# Patient Record
Sex: Female | Born: 1995 | Race: White | Hispanic: No | Smoking: Never smoker
Health system: Southern US, Community
[De-identification: ages and names within clinical notes are randomized; demographics above are authoritative.]

## PROBLEM LIST (undated history)

## (undated) DIAGNOSIS — F319 Bipolar disorder, unspecified: Secondary | ICD-10-CM

## (undated) DIAGNOSIS — O139 Gestational [pregnancy-induced] hypertension without significant proteinuria, unspecified trimester: Secondary | ICD-10-CM

## (undated) DIAGNOSIS — F909 Attention-deficit hyperactivity disorder, unspecified type: Secondary | ICD-10-CM

## (undated) DIAGNOSIS — F32A Depression, unspecified: Secondary | ICD-10-CM

## (undated) DIAGNOSIS — O1413 Severe pre-eclampsia, third trimester: Secondary | ICD-10-CM

## (undated) DIAGNOSIS — K219 Gastro-esophageal reflux disease without esophagitis: Secondary | ICD-10-CM

## (undated) DIAGNOSIS — F419 Anxiety disorder, unspecified: Secondary | ICD-10-CM

## (undated) DIAGNOSIS — K8020 Calculus of gallbladder without cholecystitis without obstruction: Secondary | ICD-10-CM

## (undated) DIAGNOSIS — F329 Major depressive disorder, single episode, unspecified: Secondary | ICD-10-CM

## (undated) HISTORY — PX: TONSILLECTOMY: SUR1361

## (undated) HISTORY — DX: Bipolar disorder, unspecified: F31.9

## (undated) HISTORY — DX: Depression, unspecified: F32.A

## (undated) HISTORY — DX: Gastro-esophageal reflux disease without esophagitis: K21.9

## (undated) HISTORY — DX: Attention-deficit hyperactivity disorder, unspecified type: F90.9

## (undated) HISTORY — DX: Calculus of gallbladder without cholecystitis without obstruction: K80.20

## (undated) HISTORY — DX: Anxiety disorder, unspecified: F41.9

## (undated) HISTORY — PX: WISDOM TOOTH EXTRACTION: SHX21

---
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
## 2015-01-28 DIAGNOSIS — K8020 Calculus of gallbladder without cholecystitis without obstruction: Secondary | ICD-10-CM | POA: Insufficient documentation

## 2015-01-28 HISTORY — DX: Calculus of gallbladder without cholecystitis without obstruction: K80.20

## 2015-02-06 ENCOUNTER — Emergency Department
Admission: EM | Admit: 2015-02-06 | Discharge: 2015-02-07 | Disposition: A | Discharge status: Home / Self Care | Payer: BLUE CROSS/BLUE SHIELD | Attending: Emergency Medicine | Admitting: Emergency Medicine

## 2015-02-06 ENCOUNTER — Emergency Department: Payer: BLUE CROSS/BLUE SHIELD

## 2015-02-06 ENCOUNTER — Encounter: Payer: Self-pay | Admitting: Emergency Medicine

## 2015-02-06 DIAGNOSIS — K2970 Gastritis, unspecified, without bleeding: Secondary | ICD-10-CM | POA: Insufficient documentation

## 2015-02-06 DIAGNOSIS — R109 Unspecified abdominal pain: Secondary | ICD-10-CM | POA: Diagnosis present

## 2015-02-06 DIAGNOSIS — R1013 Epigastric pain: Secondary | ICD-10-CM

## 2015-02-06 DIAGNOSIS — Z79899 Other long term (current) drug therapy: Secondary | ICD-10-CM | POA: Insufficient documentation

## 2015-02-06 DIAGNOSIS — Z3202 Encounter for pregnancy test, result negative: Secondary | ICD-10-CM | POA: Diagnosis not present

## 2015-02-06 DIAGNOSIS — K8050 Calculus of bile duct without cholangitis or cholecystitis without obstruction: Secondary | ICD-10-CM

## 2015-02-06 LAB — COMPREHENSIVE METABOLIC PANEL
ALBUMIN: 4.9 g/dL (ref 3.5–5.0)
ALK PHOS: 84 U/L (ref 38–126)
ALT: 13 U/L — AB (ref 14–54)
AST: 17 U/L (ref 15–41)
Anion gap: 9 (ref 5–15)
BUN: 8 mg/dL (ref 6–20)
CALCIUM: 9.7 mg/dL (ref 8.9–10.3)
CHLORIDE: 103 mmol/L (ref 101–111)
CO2: 27 mmol/L (ref 22–32)
CREATININE: 0.61 mg/dL (ref 0.44–1.00)
GFR calc Af Amer: >60 mL/min (ref >60)
GFR calc non Af Amer: >60 mL/min (ref >60)
GLUCOSE: 115 mg/dL — AB (ref 65–99)
Potassium: 3.7 mmol/L (ref 3.5–5.1)
SODIUM: 139 mmol/L (ref 135–145)
Total Bilirubin: 0.5 mg/dL (ref 0.3–1.2)
Total Protein: 8.4 g/dL — ABNORMAL HIGH (ref 6.5–8.1)

## 2015-02-06 LAB — CBC
HCT: 39 % (ref 35.0–47.0)
HEMOGLOBIN: 12.9 g/dL (ref 12.0–16.0)
MCH: 26 pg (ref 26.0–34.0)
MCHC: 33.2 g/dL (ref 32.0–36.0)
MCV: 78.3 fL — AB (ref 80.0–100.0)
Platelets: 320 10*3/uL (ref 150–440)
RBC: 4.98 MIL/uL (ref 3.80–5.20)
RDW: 15.5 % — ABNORMAL HIGH (ref 11.5–14.5)
WBC: 9 10*3/uL (ref 3.6–11.0)

## 2015-02-06 LAB — LIPASE, BLOOD: LIPASE: 25 U/L (ref 11–51)

## 2015-02-06 MED ORDER — ONDANSETRON 4 MG PO TBDP
ORAL_TABLET | ORAL | Status: DC
Start: 2015-02-06 — End: 2015-02-07
  Filled 2015-02-06: qty 1

## 2015-02-06 MED ORDER — DICYCLOMINE HCL 20 MG PO TABS: 20.0000 mg | ORAL_TABLET | Freq: Three times a day (TID) | ORAL | Status: DC | PRN

## 2015-02-06 MED ORDER — ONDANSETRON 4 MG PO TBDP
4.0000 mg | ORAL_TABLET | Freq: Once | ORAL | Status: AC | PRN
  Administered 2015-02-06: 4 mg via ORAL

## 2015-02-06 MED ORDER — GI COCKTAIL ~~LOC~~
30.0000 mL | Freq: Once | ORAL | Status: AC
  Administered 2015-02-06: 30 mL via ORAL
  Filled 2015-02-06: qty 30

## 2015-02-06 MED ORDER — PANTOPRAZOLE SODIUM 20 MG PO TBEC: 20.0000 mg | DELAYED_RELEASE_TABLET | Freq: Every day | ORAL | Status: DC

## 2015-02-06 MED ORDER — HYDROMORPHONE HCL 1 MG/ML IJ SOLN
0.5000 mg | Freq: Once | INTRAMUSCULAR | Status: AC
  Administered 2015-02-06: 0.5 mg via INTRAVENOUS
  Filled 2015-02-06: qty 1

## 2015-02-06 MED ORDER — ACETAMINOPHEN 500 MG PO TABS
1000.0000 mg | ORAL_TABLET | Freq: Once | ORAL | Status: AC
  Administered 2015-02-06: 1000 mg via ORAL

## 2015-02-06 MED ORDER — SODIUM CHLORIDE 0.9 % IV BOLUS (SEPSIS)
1000.0000 mL | Freq: Once | INTRAVENOUS | Status: AC
  Administered 2015-02-06: 1000 mL via INTRAVENOUS

## 2015-02-06 MED ORDER — PROMETHAZINE HCL 12.5 MG PO TABS: 12.5000 mg | ORAL_TABLET | Freq: Four times a day (QID) | ORAL | Status: DC | PRN

## 2015-02-06 MED ORDER — ACETAMINOPHEN 500 MG PO TABS
ORAL_TABLET | ORAL | Status: AC
  Filled 2015-02-06: qty 2

## 2015-02-06 MED ORDER — PROMETHAZINE HCL 12.5 MG RE SUPP: 12.5000 mg | Freq: Four times a day (QID) | RECTAL | Status: DC | PRN

## 2015-02-06 MED ORDER — PROMETHAZINE HCL 25 MG/ML IJ SOLN
6.2500 mg | Freq: Once | INTRAMUSCULAR | Status: AC
  Administered 2015-02-06: 6.25 mg via INTRAVENOUS
  Filled 2015-02-06: qty 1

## 2015-02-06 NOTE — ED Notes (Signed)
Pt with emesis in subwait. Pt to go to room 18. Pt states "please help me i want to feel better, hurry up, i can't take this anymore."

## 2015-02-06 NOTE — ED Notes (Signed)
Patient transported to Ultrasound 

## 2015-02-06 NOTE — ED Provider Notes (Signed)
Time Seen: Approximately ----------------------------------------- 11:42 PM on 02/06/2015 -----------------------------------------    I have reviewed the triage notes  Chief Complaint: Abdominal Pain and Emesis   History of Present Illness: Zoe Hunter is a 19 y.o. female who presents with acute onset of epigastric abdominal pain that she states started earlier this afternoon. She describes vomiting multiple times with no blood or bile. She points mainly to an area just superior to the umbilicus is source of her discomfort. She states she's had similar pain before or just not as bad as been diagnosed with gastritis and esophageal reflux disease. She denies any loose stool or diarrhea to this historian and denies any melena or hematochezia. She denies any back or flank pain is not aware of any fever or chills at home. She denies any hematemesis or biliary emesis.   History reviewed. No pertinent past medical history.  There are no active problems to display for this patient.   History reviewed. No pertinent past surgical history.  History reviewed. No pertinent past surgical history.  Current Outpatient Rx  Name  Route  Sig  Dispense  Refill  . amphetamine-dextroamphetamine (ADDERALL) 30 MG tablet   Oral   Take 30 mg by mouth daily.         Marland Kitchen. atomoxetine (STRATTERA) 40 MG capsule   Oral   Take 40 mg by mouth daily.         . cholecalciferol (VITAMIN D) 1000 UNITS tablet   Oral   Take 1,000 Units by mouth daily.         Marland Kitchen. lamoTRIgine (LAMICTAL) 150 MG tablet   Oral   Take 150 mg by mouth.          . ranitidine (ZANTAC) 150 MG tablet   Oral   Take 150 mg by mouth 2 (two) times daily.           Allergies:  Review of patient's allergies indicates no known allergies.  Family History: No family history on file.  Social History: Social History  Substance Use Topics  . Smoking status: Never Smoker   . Smokeless tobacco: Never Used  . Alcohol Use: No      Review of Systems:   10 point review of systems was performed and was otherwise negative:  Constitutional: No fever Eyes: No visual disturbances ENT: No sore throat, ear pain Cardiac: No chest pain Respiratory: No shortness of breath, wheezing, or stridor Abdomen: Epigastric pain, vomited multiple times, No diarrhea Endocrine: No weight loss, No night sweats Extremities: No peripheral edema, cyanosis Skin: No rashes, easy bruising Neurologic: No focal weakness, trouble with speech or swollowing Urologic: No dysuria, Hematuria, or urinary frequency Patient denies any risk of being pregnant  Physical Exam:  ED Triage Vitals  Enc Vitals Group     BP 02/06/15 2037 145/68 mmHg     Pulse Rate 02/06/15 2037 70     Resp 02/06/15 2037 18     Temp 02/06/15 2037 98 F (36.7 C)     Temp Source 02/06/15 2037 Oral     SpO2 02/06/15 2037 100 %     Weight 02/06/15 2037 195 lb (88.451 kg)     Height 02/06/15 2037 5\' 2"  (1.575 m)     Head Cir --      Peak Flow --      Pain Score 02/06/15 2038 10     Pain Loc --      Pain Edu? --      Excl.  in GC? --     General: Awake , Alert , and Oriented times 3; GCS 15 rather dramatic historian Head: Normal cephalic , atraumatic Eyes: Pupils equal , round, reactive to light Nose/Throat: No nasal drainage, patent upper airway without erythema or exudate.  Neck: Supple, Full range of motion, No anterior adenopathy or palpable thyroid masses Lungs: Clear to ascultation without wheezes , rhonchi, or rales Heart: Regular rate, regular rhythm without murmurs , gallops , or rubs Abdomen: Patient's tender primarily toward the epigastric region, no rebound guarding , or rigidity; bowel sounds positive and symmetric in all 4 quadrants. No organomegaly . Negative Murphy's sign, no tenderness over McBurney's point       Extremities: 2 plus symmetric pulses. No edema, clubbing or cyanosis Neurologic: normal ambulation, Motor symmetric without deficits,  sensory intact Skin: warm, dry, no rashes   Labs:   All laboratory work was reviewed including any pertinent negatives or positives listed below:  Labs Reviewed  COMPREHENSIVE METABOLIC PANEL - Abnormal; Notable for the following:    Glucose, Bld 115 (*)    Total Protein 8.4 (*)    ALT 13 (*)    All other components within normal limits  CBC - Abnormal; Notable for the following:    MCV 78.3 (*)    RDW 15.5 (*)    All other components within normal limits  LIPASE, BLOOD  URINALYSIS COMPLETEWITH MICROSCOPIC ([facility] ONLY)  POC URINE PREG, ED      Radiology:  EXAM: US ABDOMEN LIMITED - RIGHT UPPER QUADRANT  COMPARISON: None.  FINDINGS: Gallbladder:  Contracted with multiple gallstones within. No pericholecystic fluid is noted.  Common bile duct:  Diameter: 3.8 mm.  Liver:  No focal lesion identified. Within normal limits in parenchymal echogenicity.  IMPRESSION: Multiple gallstones without complicating factors.     I personally reviewed the radiologic studies     ED Course:  Patient's stay was uneventful and she had symptomatic improvement with IV pain meds and anti-medic therapy. Her differential is primarily around biliary colic versus acute cholecystitis versus other causes for epigastric abdominal pain. Patient was found to have multiple gallstones on her ultrasound but does not clinically have any evidence of acute cholecystitis. Patient be discharged with instructions on biliary colic and advised to return here especially for fever, uncontrolled vomiting, or any other new concerns.    Assessment: Acute epigastric abdominal pain Biliary colic   Final Clinical Impression:  Final diagnoses:  Epigastric pain     Plan: Outpatient management Patient was advised to return immediately if condition worsens. Patient was advised to follow up with their primary care physician or other specialized physicians involved in their outpatient  care             Jennye Moccasin, MD 02/07/15 1700

## 2015-02-06 NOTE — ED Notes (Addendum)
Pt requesting "something to drink and something for pain". Dr. Mayford KnifeWilliams notified. Order for tylenol received. NPO status again explained to pt.

## 2015-02-06 NOTE — ED Notes (Signed)
Pt states has had abdominal pain bilateral upper and vomiting without diarrhea since yesterday. Pt denies fever or chills.

## 2015-02-06 NOTE — ED Notes (Signed)
Pt ambulatory to intake desk, smiling, texting on phone in no acute distress.

## 2015-02-06 NOTE — ED Notes (Signed)
Pt informed of possible wait, pt immediately calling out, "help me, hurry, i can't wait." skin normal color warm and dry. Pt informed will place in subwait in recliner "to rest" while awaiting room. Pt verbalizes understanding.

## 2015-02-07 LAB — POCT PREGNANCY, URINE: Preg Test, Ur: NEGATIVE

## 2015-02-07 LAB — URINALYSIS COMPLETE WITH MICROSCOPIC (ARMC ONLY)
BILIRUBIN URINE: NEGATIVE
GLUCOSE, UA: NEGATIVE mg/dL
HGB URINE DIPSTICK: NEGATIVE
Leukocytes, UA: NEGATIVE
Nitrite: NEGATIVE
Protein, ur: 100 mg/dL — AB
Specific Gravity, Urine: 1.027 (ref 1.005–1.030)
pH: 8 (ref 5.0–8.0)

## 2015-02-07 NOTE — ED Provider Notes (Signed)
----------------------------------------- 11:30 PM on 02/06/2015 -----------------------------------------   Blood pressure 132/93, pulse 67, temperature 98 F (36.7 C), temperature source Oral, resp. rate 18, height  (1.575 m), weight 88.451 kg, last menstrual period 02/04/2015, SpO2 98 %.  Assuming care from Dr. Huel Cote.  In short, Zoe Hunter is a 19 y.o. female with a chief complaint of Abdominal Pain and Emesis .  Refer to the original H&P for additional details.  The current plan of care is to follow-up the ultrasound and urinalysis and urine pregnancy and reassess.  ----------------------------------------- 1:15 AM on 02/07/2015 -----------------------------------------  The patient's ultrasound is notable for multiple gallstones but no evidence of cholecystitis.  Symptoms are well controlled at this time.  Her urinalysis and U pregnant are both negative.  I discussed with the patient and her family the results of the ultrasound and I revised Dr. Renaee Munda discharge instructions to reflect an additional diagnosis of biliary colic.  I gave my usual and customary return precautions.     US Abdomen Limited Ruq  02/06/2015  CLINICAL DATA:  Epigastric pain EXAM: US ABDOMEN LIMITED - RIGHT UPPER QUADRANT COMPARISON:  None. FINDINGS: Gallbladder: Contracted with multiple gallstones within. No pericholecystic fluid is noted. Common bile duct: Diameter: 3.8 mm. Liver: No focal lesion identified. Within normal limits in parenchymal echogenicity. IMPRESSION: Multiple gallstones without complicating factors. Electronically Signed   By: Alcide Clever M.D.   On: 02/06/2015 23:49    Results for orders placed or performed during the hospital encounter of 02/06/15  Lipase, blood  Result Value Ref Range   Lipase 25 11 - 51 U/L  Comprehensive metabolic panel  Result Value Ref Range   Sodium 139 135 - 145 mmol/L   Potassium 3.7 3.5 - 5.1 mmol/L   Chloride 103 101 - 111 mmol/L   CO2 27 22 -  32 mmol/L   Glucose, Bld 115 (H) 65 - 99 mg/dL   BUN 8 6 - 20 mg/dL   Creatinine, Ser 7.82 0.44 - 1.00 mg/dL   Calcium 9.7 8.9 - 95.6 mg/dL   Total Protein 8.4 (H) 6.5 - 8.1 g/dL   Albumin 4.9 3.5 - 5.0 g/dL   AST 17 15 - 41 U/L   ALT 13 (L) 14 - 54 U/L   Alkaline Phosphatase 84 38 - 126 U/L   Total Bilirubin 0.5 0.3 - 1.2 mg/dL   GFR calc non Af Amer >60 >60 mL/min   GFR calc Af Amer >60 >60 mL/min   Anion gap 9 5 - 15  CBC  Result Value Ref Range   WBC 9.0 3.6 - 11.0 K/uL   RBC 4.98 3.80 - 5.20 MIL/uL   Hemoglobin 12.9 12.0 - 16.0 g/dL   HCT 21.3 08.6 - 57.8 %   MCV 78.3 (L) 80.0 - 100.0 fL   MCH 26.0 26.0 - 34.0 pg   MCHC 33.2 32.0 - 36.0 g/dL   RDW 46.9 (H) 62.9 - 52.8 %   Platelets 320 150 - 440 K/uL  Urinalysis complete, with microscopic ([facility] only)  Result Value Ref Range   Color, Urine YELLOW (A) YELLOW   APPearance HAZY (A) CLEAR   Glucose, UA NEGATIVE NEGATIVE mg/dL   Bilirubin Urine NEGATIVE NEGATIVE   Ketones, ur 1+ (A) NEGATIVE mg/dL   Specific Gravity, Urine 1.027 1.005 - 1.030   Hgb urine dipstick NEGATIVE NEGATIVE   pH 8.0 5.0 - 8.0   Protein, ur 100 (A) NEGATIVE mg/dL   Nitrite NEGATIVE NEGATIVE   Leukocytes, UA  NEGATIVE NEGATIVE   RBC / HPF 0-5 0 - 5 RBC/hpf   WBC, UA 0-5 0 - 5 WBC/hpf   Bacteria, UA RARE (A) NONE SEEN   Squamous Epithelial / LPF 0-5 (A) NONE SEEN   Mucous PRESENT    Amorphous Crystal PRESENT   Pregnancy, urine POC  Result Value Ref Range   Preg Test, Ur NEGATIVE NEGATIVE     Loleta Roseory Rehaan Viloria, MD 02/07/15 [phone]

## 2015-02-07 NOTE — Discharge Instructions (Signed)
As we discussed, your symptoms may be caused by gallstones, or just standard gastritis.  Please read through the information below.  Given the gallstones on your ultrasound, we do recommend that you follow-up with a surgeon to discuss scheduling a surgery to have your gallbladder removed (cholecystectomy).  Return to the emergency department with new or worsening symptoms that concern you.  Biliary Colic  Biliary colic is a pain in the upper abdomen. The pain:  Is usually felt on the right side of the abdomen, but it may also be felt in the center of the abdomen, just below the breastbone (sternum).  May spread back toward the right shoulder blade.  May be steady or irregular.  May be accompanied by nausea and vomiting. Most of the time, the pain goes away in 1-5 hours. After the most intense pain passes, the abdomen may continue to ache mildly for about 24 hours.    Biliary colic is caused by a blockage in the bile duct. The bile duct is a pathway that carries bile--a liquid that helps to digest fats--from the gallbladder to the small intestine. Biliary colic usually occurs after eating, when the digestive system demands bile. The pain develops when muscle cells contract forcefully to try to move the blockage so that bile can get by.  HOME CARE INSTRUCTIONS  Take medicines only as directed by your health care provider.  Drink enough fluid to keep your urine clear or pale yellow.  Avoid fatty, greasy, and fried foods. These kinds of foods increase your body's demand for bile.  Avoid any foods that make your pain worse.  Avoid overeating.  Avoid having a large meal after fasting. SEEK MEDICAL CARE IF:  You develop a fever.  Your pain gets worse.  You vomit.  You develop nausea that prevents you from eating and drinking. SEEK IMMEDIATE MEDICAL CARE IF:  You suddenly develop a fever and shaking chills.  You develop a yellowish discoloration (jaundice) of:  Skin.  Whites of the eyes.    Mucous membranes. You have continuous or severe pain that is not relieved with medicines.  You have nausea and vomiting that is not relieved with medicines.  You develop dizziness or you faint. This information is not intended to replace advice given to you by your health care provider. Make sure you discuss any questions you have with your health care provider.  Document Released: 07/17/2005 Document Revised: 06/30/2014 Document Reviewed: 11/25/2013  Elsevier Interactive Patient Education 2016 [employer] Inc[employer]    Gastritis, Adult Gastritis is soreness and swelling (inflammation) of the lining of the stomach. Gastritis can develop as a sudden onset (acute) or long-term (chronic) condition. If gastritis is not treated, it can lead to stomach bleeding and ulcers. CAUSES  Gastritis occurs when the stomach lining is weak or damaged. Digestive juices from the stomach then inflame the weakened stomach lining. The stomach lining may be weak or damaged due to viral or bacterial infections. One common bacterial infection is the Helicobacter pylori infection. Gastritis can also result from excessive alcohol consumption, taking certain medicines, or having too much acid in the stomach.  SYMPTOMS  In some cases, there are no symptoms. When symptoms are present, they may include:  Pain or a burning sensation in the upper abdomen.  Nausea.  Vomiting.  An uncomfortable feeling of fullness after eating. DIAGNOSIS  Your caregiver may suspect you have gastritis based on your symptoms and a physical exam. To determine the cause of your gastritis, your caregiver may  perform the following:  Blood or stool tests to check for the H pylori bacterium.  Gastroscopy. A thin, flexible tube (endoscope) is passed down the esophagus and into the stomach. The endoscope has a light and camera on the end. Your caregiver uses the endoscope to view the inside of the stomach.  Taking a tissue sample (biopsy) from the  stomach to examine under a microscope. TREATMENT  Depending on the cause of your gastritis, medicines may be prescribed. If you have a bacterial infection, such as an H pylori infection, antibiotics may be given. If your gastritis is caused by too much acid in the stomach, H2 blockers or antacids may be given. Your caregiver may recommend that you stop taking aspirin, ibuprofen, or other nonsteroidal anti-inflammatory drugs (NSAIDs). HOME CARE INSTRUCTIONS  Only take over-the-counter or prescription medicines as directed by your caregiver.  If you were given antibiotic medicines, take them as directed. Finish them even if you start to feel better.  Drink enough fluids to keep your urine clear or pale yellow.  Avoid foods and drinks that make your symptoms worse, such as:  Caffeine or alcoholic drinks.  Chocolate.  Peppermint or mint flavorings.  Garlic and onions.  Spicy foods.  Citrus fruits, such as oranges, lemons, or limes.  Tomato-based foods such as sauce, chili, salsa, and pizza.  Fried and fatty foods.  Eat small, frequent meals instead of large meals. SEEK IMMEDIATE MEDICAL CARE IF:   You have black or dark red stools.  You vomit blood or material that looks like coffee grounds.  You are unable to keep fluids down.  Your abdominal pain gets worse.  You have a fever.  You do not feel better after 1 week.  You have any other questions or concerns. MAKE SURE YOU:  Understand these instructions.  Will watch your condition.  Will get help right away if you are not doing well or get worse.   This information is not intended to replace advice given to you by your health care provider. Make sure you discuss any questions you have with your health care provider.   Document Released: 02/07/2001 Document Revised: 08/15/2011 Document Reviewed: 03/29/2011 Elsevier Interactive Patient Education [employer]2016 [employer].  Please return immediately if condition worsens.  Please contact her primary physician or the physician you were given for referral. If you have any specialist physicians involved in her treatment and plan please also contact them. Thank you for using [facility]. Please return especially for fever, uncontrolled vomiting, bloody emesis, or dark tarry stool.

## 2015-02-08 ENCOUNTER — Telehealth: Payer: Self-pay

## 2015-02-08 ENCOUNTER — Encounter: Payer: Self-pay | Admitting: Emergency Medicine

## 2015-02-08 ENCOUNTER — Emergency Department
Admission: EM | Admit: 2015-02-08 | Discharge: 2015-02-08 | Disposition: A | Discharge status: Home / Self Care | Payer: BLUE CROSS/BLUE SHIELD | Attending: Emergency Medicine | Admitting: Emergency Medicine

## 2015-02-08 DIAGNOSIS — K8020 Calculus of gallbladder without cholecystitis without obstruction: Secondary | ICD-10-CM | POA: Insufficient documentation

## 2015-02-08 DIAGNOSIS — Z79899 Other long term (current) drug therapy: Secondary | ICD-10-CM | POA: Insufficient documentation

## 2015-02-08 DIAGNOSIS — K8050 Calculus of bile duct without cholangitis or cholecystitis without obstruction: Secondary | ICD-10-CM

## 2015-02-08 DIAGNOSIS — R109 Unspecified abdominal pain: Secondary | ICD-10-CM | POA: Diagnosis present

## 2015-02-08 DIAGNOSIS — R1011 Right upper quadrant pain: Secondary | ICD-10-CM

## 2015-02-08 LAB — CBC
HCT: 38.1 % (ref 35.0–47.0)
HEMOGLOBIN: 12.3 g/dL (ref 12.0–16.0)
MCH: 25.3 pg — AB (ref 26.0–34.0)
MCHC: 32.2 g/dL (ref 32.0–36.0)
MCV: 78.7 fL — AB (ref 80.0–100.0)
Platelets: 315 10*3/uL (ref 150–440)
RBC: 4.84 MIL/uL (ref 3.80–5.20)
RDW: 15.3 % — ABNORMAL HIGH (ref 11.5–14.5)
WBC: 10.3 10*3/uL (ref 3.6–11.0)

## 2015-02-08 LAB — COMPREHENSIVE METABOLIC PANEL
ALT: 12 U/L — ABNORMAL LOW (ref 14–54)
ANION GAP: 7 (ref 5–15)
AST: 15 U/L (ref 15–41)
Albumin: 4.5 g/dL (ref 3.5–5.0)
Alkaline Phosphatase: 89 U/L (ref 38–126)
BUN: 10 mg/dL (ref 6–20)
CHLORIDE: 103 mmol/L (ref 101–111)
CO2: 29 mmol/L (ref 22–32)
Calcium: 9.2 mg/dL (ref 8.9–10.3)
Creatinine, Ser: 0.76 mg/dL (ref 0.44–1.00)
GFR calc non Af Amer: >60 mL/min (ref >60)
Glucose, Bld: 95 mg/dL (ref 65–99)
Potassium: 3.3 mmol/L — ABNORMAL LOW (ref 3.5–5.1)
SODIUM: 139 mmol/L (ref 135–145)
Total Bilirubin: 0.4 mg/dL (ref 0.3–1.2)
Total Protein: 7.6 g/dL (ref 6.5–8.1)

## 2015-02-08 LAB — LIPASE, BLOOD: LIPASE: 37 U/L (ref 11–51)

## 2015-02-08 MED ORDER — SODIUM CHLORIDE 0.9 % IV BOLUS (SEPSIS)
1000.0000 mL | Freq: Once | INTRAVENOUS | Status: AC
Start: 2015-02-08 — End: 2015-02-08
  Administered 2015-02-08: 1000 mL via INTRAVENOUS

## 2015-02-08 MED ORDER — FENTANYL CITRATE (PF) 100 MCG/2ML IJ SOLN
50.0000 ug | Freq: Once | INTRAMUSCULAR | Status: AC
  Administered 2015-02-08: 50 ug via INTRAVENOUS
  Filled 2015-02-08: qty 2

## 2015-02-08 MED ORDER — MORPHINE SULFATE (PF) 4 MG/ML IV SOLN
4.0000 mg | Freq: Once | INTRAVENOUS | Status: AC
  Administered 2015-02-08: 4 mg via INTRAVENOUS
  Filled 2015-02-08: qty 1

## 2015-02-08 MED ORDER — ONDANSETRON HCL 4 MG/2ML IJ SOLN
4.0000 mg | Freq: Once | INTRAMUSCULAR | Status: AC
  Administered 2015-02-08: 4 mg via INTRAVENOUS
  Filled 2015-02-08: qty 2

## 2015-02-08 MED ORDER — OXYCODONE-ACETAMINOPHEN 5-325 MG PO TABS: 1.0000 | ORAL_TABLET | ORAL | Status: DC | PRN

## 2015-02-08 MED ORDER — ONDANSETRON 4 MG PO TBDP: 4.0000 mg | ORAL_TABLET | Freq: Three times a day (TID) | ORAL | Status: DC | PRN

## 2015-02-08 NOTE — Discharge Instructions (Signed)
1. Take pain and nausea medicines as needed (Percocet/Zofran #20 and (. 2. Bland diet times one week, then slowly advance as tolerated. Avoid fatty, spicy, greasy foods and drinks. 3. Return to the ER for any symptoms, persistent vomiting, difficulty breathing or other concerns.  Abdominal Pain, Adult Many things can cause abdominal pain. Usually, abdominal pain is not caused by a disease and will improve without treatment. It can often be observed and treated at home. Your health care provider will do a physical exam and possibly order blood tests and X-rays to help determine the seriousness of your pain. However, in many cases, more time must pass before a clear cause of the pain can be found. Before that point, your health care provider may not know if you need more testing or further treatment. HOME CARE INSTRUCTIONS Monitor your abdominal pain for any changes. The following actions may help to alleviate any discomfort you are experiencing:  Only take over-the-counter or prescription medicines as directed by your health care provider.  Do not take laxatives unless directed to do so by your health care provider.  Try a clear liquid diet (broth, tea, or water) as directed by your health care provider. Slowly move to a bland diet as tolerated. SEEK MEDICAL CARE IF:  You have unexplained abdominal pain.  You have abdominal pain associated with nausea or diarrhea.  You have pain when you urinate or have a bowel movement.  You experience abdominal pain that wakes you in the night.  You have abdominal pain that is worsened or improved by eating food.  You have abdominal pain that is worsened with eating fatty foods.  You have a fever. SEEK IMMEDIATE MEDICAL CARE IF:  Your pain does not go away within 2 hours.  You keep throwing up (vomiting).  Your pain is felt only in portions of the abdomen, such as the right side or the left lower portion of the abdomen.  You pass bloody or black  tarry stools. MAKE SURE YOU:  Understand these instructions.  Will watch your condition.  Will get help right away if you are not doing well or get worse.   This information is not intended to replace advice given to you by your health care provider. Make sure you discuss any questions you have with your health care provider.   Document Released: 11/23/2004 Document Revised: 11/04/2014 Document Reviewed: 10/23/2012 Elsevier Interactive Patient Education 2016 Elsevier Inc.  Biliary Colic Biliary colic is a pain in the upper abdomen. The pain:  Is usually felt on the right side of the abdomen, but it may also be felt in the center of the abdomen, just below the breastbone (sternum).  May spread back toward the right shoulder blade.  May be steady or irregular.  May be accompanied by nausea and vomiting. Most of the time, the pain goes away in 1-5 hours. After the most intense pain passes, the abdomen may continue to ache mildly for about 24 hours. Biliary colic is caused by a blockage in the bile duct. The bile duct is a pathway that carries bile--a liquid that helps to digest fats--from the gallbladder to the small intestine. Biliary colic usually occurs after eating, when the digestive system demands bile. The pain develops when muscle cells contract forcefully to try to move the blockage so that bile can get by. HOME CARE INSTRUCTIONS  Take medicines only as directed by your health care provider.  Drink enough fluid to keep your urine clear or pale yellow.  Avoid fatty, greasy, and fried foods. These kinds of foods increase your body's demand for bile.  Avoid any foods that make your pain worse.  Avoid overeating.  Avoid having a large meal after fasting. SEEK MEDICAL CARE IF:  You develop a fever.  Your pain gets worse.  You vomit.  You develop nausea that prevents you from eating and drinking. SEEK IMMEDIATE MEDICAL CARE IF:  You suddenly develop a fever and  shaking chills.  You develop a yellowish discoloration (jaundice) of:  Skin.  Whites of the eyes.  Mucous membranes.  You have continuous or severe pain that is not relieved with medicines.  You have nausea and vomiting that is not relieved with medicines.  You develop dizziness or you faint.   This information is not intended to replace advice given to you by your health care provider. Make sure you discuss any questions you have with your health care provider.   Document Released: 07/17/2005 Document Revised: 06/30/2014 Document Reviewed: 11/25/2013 Elsevier Interactive Patient Education [employer]2016 [employer].

## 2015-02-08 NOTE — ED Provider Notes (Signed)
[facility] Emergency Department Provider Note  ____________________________________________  Time seen: Approximately 1:35 AM  I have reviewed the triage vital signs and the nursing notes.   HISTORY  Chief Complaint Abdominal Pain    HPI Zoe Hunter is a 19 y.o. female who presents to the ED from home with a chief complaint of abdominal pain. Patient was seen in the ED last night and diagnosed with multiple gallstones on ultrasound. She began to experience severe pain after eating [employer] this evening associated with nausea and vomiting. Denies associated fever, chills, chest pain, shortness of breath, diarrhea. Nothing makes her pain better. Eating made her pain worse.   Past medical history ADHD  There are no active problems to display for this patient.   History reviewed. No pertinent past surgical history.  Current Outpatient Rx  Name  Route  Sig  Dispense  Refill  . amphetamine-dextroamphetamine (ADDERALL) 30 MG tablet   Oral   Take 30 mg by mouth daily.         Marland Kitchen. atomoxetine (STRATTERA) 40 MG capsule   Oral   Take 40 mg by mouth daily.         . cholecalciferol (VITAMIN D) 1000 UNITS tablet   Oral   Take 1,000 Units by mouth daily.         Marland Kitchen. dicyclomine (BENTYL) 20 MG tablet   Oral   Take 1 tablet (20 mg total) by mouth 3 (three) times daily as needed for spasms.   30 tablet   0   . lamoTRIgine (LAMICTAL) 150 MG tablet   Oral   Take 150 mg by mouth.          . pantoprazole (PROTONIX) 20 MG tablet   Oral   Take 1 tablet (20 mg total) by mouth daily.   30 tablet   1   . promethazine (PHENERGAN) 12.5 MG suppository   Rectal   Place 1 suppository (12.5 mg total) rectally every 6 (six) hours as needed for nausea or vomiting.   12 each   0   . promethazine (PHENERGAN) 12.5 MG tablet   Oral   Take 1 tablet (12.5 mg total) by mouth every 6 (six) hours as needed for nausea or vomiting.   30 tablet   0   . ranitidine  (ZANTAC) 150 MG tablet   Oral   Take 150 mg by mouth 2 (two) times daily.           Allergies Review of patient's allergies indicates no known allergies.  No family history on file.  Social History Social History  Substance Use Topics  . Smoking status: Never Smoker   . Smokeless tobacco: Never Used  . Alcohol Use: No    Review of Systems Constitutional: No fever/chills Eyes: No visual changes. ENT: No sore throat. Cardiovascular: Denies chest pain. Respiratory: Denies shortness of breath. Gastrointestinal: Positive for abdominal pain.  Positive for nausea and vomiting.  No diarrhea.  No constipation. Genitourinary: Negative for dysuria. Musculoskeletal: Negative for back pain. Skin: Negative for rash. Neurological: Negative for headaches, focal weakness or numbness.  10-point ROS otherwise negative.  ____________________________________________   PHYSICAL EXAM:  VITAL SIGNS: ED Triage Vitals  Enc Vitals Group     BP 02/08/15 0034 178/145 mmHg     Pulse Rate 02/08/15 0032 85     Resp 02/08/15 0032 22     Temp 02/08/15 0032 98.9 F (37.2 C)     Temp Source 02/08/15 0032 Oral  SpO2 02/08/15 0032 100 %     Weight 02/08/15 0032 195 lb (88.451 kg)     Height 02/08/15 0032  (1.575 m)     Head Cir --      Peak Flow --      Pain Score 02/08/15 0034 10     Pain Loc --      Pain Edu? --      Excl. in GC? --     Constitutional: Alert and oriented. Well appearing and in moderate acute distress. Moaning. Eyes: Conjunctivae are normal. PERRL. EOMI. Head: Atraumatic. Nose: No congestion/rhinnorhea. Mouth/Throat: Mucous membranes are moist.  Oropharynx non-erythematous. Neck: No stridor.   Cardiovascular: Normal rate, regular rhythm. Grossly normal heart sounds.  Good peripheral circulation. Respiratory: Normal respiratory effort.  No retractions. Lungs CTAB. Gastrointestinal: Soft and moderately tender to palpation right upper quadrant without rebound or  guarding. No distention. No abdominal bruits. No CVA tenderness. Musculoskeletal: No lower extremity tenderness nor edema.  No joint effusions. Neurologic:  Normal speech and language. No gross focal neurologic deficits are appreciated. No gait instability. Skin:  Skin is warm, dry and intact. No rash noted. Psychiatric: Mood and affect are normal. Speech and behavior are normal.  ____________________________________________   LABS (all labs ordered are listed, but only abnormal results are displayed)  Labs Reviewed  COMPREHENSIVE METABOLIC PANEL - Abnormal; Notable for the following:    Potassium 3.3 (*)    ALT 12 (*)    All other components within normal limits  CBC - Abnormal; Notable for the following:    MCV 78.7 (*)    MCH 25.3 (*)    RDW 15.3 (*)    All other components within normal limits  LIPASE, BLOOD  URINALYSIS COMPLETEWITH MICROSCOPIC ([facility] ONLY)   ____________________________________________  EKG  None ____________________________________________  RADIOLOGY  None ____________________________________________   PROCEDURES  Procedure(s) performed: None  Critical Care performed: No  ____________________________________________   INITIAL IMPRESSION / ASSESSMENT AND PLAN / ED COURSE  Pertinent labs & imaging results that were available during my care of the patient were reviewed by me and considered in my medical decision making (see chart for details).  19 year old female who returns to the ED with right upper quadrant abdominal pain and vomiting. She was found to have multiple gallstones on ultrasound last night. Uncomfortable appearing in moderate distress. Will recheck lab work and administer IV analgesia.  ----------------------------------------- 1:46 AM on 02/08/2015 -----------------------------------------  IV morphine administered for persistent pain. Discussed with Dr. Orvis Brill (surgery on call) who feels there is no indication for emergent  surgery or hospital admission. Will try pain control and call back if patient's pain is not adequately controlled.  ----------------------------------------- 3:15 AM on 02/08/2015 -----------------------------------------  Patient is sleeping. No further emesis. Lengthy discussion with her grandparents; plan for discharge home with analgesia, antiemetic and close outpatient follow-up. Strict return precautions given. All verbalize understanding and agree with plan of care. ____________________________________________   FINAL CLINICAL IMPRESSION(S) / ED DIAGNOSES  Final diagnoses:  Biliary colic  Right upper quadrant pain      Irean Hong, MD 02/08/15 0602

## 2015-02-08 NOTE — ED Notes (Signed)
Pt to triage via w/c, moaning; st here last night and dx with gallstones; st return of abd pain "all over" accomp by N/V

## 2015-02-08 NOTE — Telephone Encounter (Signed)
Patient calls back in at this time. Is having severe pain and episodes of vomiting but does not want to go to ER once again. Reviewed notes from ER. Does not indicate immediate need for surgery but since patient is having significant pain, will bring patient in tomorrow to urgent clinic to be seen by Dr. Tonita CongWoodham. Patient denies fever, constipation, or diarrhea. "Episodes" of pain and vomiting last 2-5 hours.  Explained to patient that if pain or vomiting becomes intolerable over night, she will need to be seen in ER once again as we [facility].  She verbalizes understanding of conversation.  [facility].

## 2015-02-09 ENCOUNTER — Encounter: Payer: Self-pay | Admitting: General Surgery

## 2015-02-09 ENCOUNTER — Ambulatory Visit (INDEPENDENT_AMBULATORY_CARE_PROVIDER_SITE_OTHER): Payer: BLUE CROSS/BLUE SHIELD | Admitting: General Surgery

## 2015-02-09 ENCOUNTER — Telehealth: Payer: Self-pay

## 2015-02-09 VITALS — BP 103/69 | HR 79 | Temp 98.2°F | Ht 62.5 in | Wt 199.0 lb

## 2015-02-09 DIAGNOSIS — K8020 Calculus of gallbladder without cholecystitis without obstruction: Secondary | ICD-10-CM | POA: Diagnosis not present

## 2015-02-09 NOTE — Patient Instructions (Addendum)
Don't be afraid to eat. However, you will need to eat healthier; non-fat foods.  If you decide on going forward on doing surgery, I recommend doing it Laparoscopic. This surgery will require four small incisions.  If you have any surgical questions, please give us a call.

## 2015-02-09 NOTE — Progress Notes (Signed)
Patient ID: Zoe Hunter, female   DOB: 05/05/1995, 19 y.o.   MRN: [id number]  CC: ABDOMINAL PAIN  HPI Zoe Chiquitomily K Basich is a 19 y.o. female presents to clinic for evaluation of right upper quadrant abdominal pain for which she's been seen in the emergency department twice. She was in the emergency department on Saturday and Sunday of the previous weekend with acute onset right upper quadrant pain that started between 30 minutes an hour after eating. The pain did not subside until she was treated in the emergency artery. Since that time the pain has been primarily gone however she has had a fear of eating. She denies any fevers, chills, chest pain, shortness breath, diarrhea, constipation. She did have nausea and vomiting with the worse of the pain over the weekend. Prior to this weekend she states she's had some mild right upper quadrant bowel pain but nothing like this before. She lives with her grandparents will be leaving town on Christmas Day.  HPI  Past Medical History  Diagnosis Date  . Bipolar disorder (HCC)   . GERD (gastroesophageal reflux disease)   . Cholelithiasis 01/2015  . ADHD (attention deficit hyperactivity disorder)     Past Surgical History  Procedure Laterality Date  . Tonsillectomy      Family History  Problem Relation Age of Onset  . Hypertension Mother   . Hypertension Maternal Grandmother   . Cancer Other     Social History Social History  Substance Use Topics  . Smoking status: Never Smoker   . Smokeless tobacco: Never Used  . Alcohol Use: No    No Known Allergies  Current Outpatient Prescriptions  Medication Sig Dispense Refill  . amphetamine-dextroamphetamine (ADDERALL) 30 MG tablet Take 30 mg by mouth daily.    Marland Kitchen. atomoxetine (STRATTERA) 40 MG capsule Take 40 mg by mouth daily.    . cholecalciferol (VITAMIN D) 1000 UNITS tablet Take 1,000 Units by mouth daily.    Marland Kitchen. lamoTRIgine (LAMICTAL) 150 MG tablet Take 1 tablet by mouth.    . Multiple Vitamin  (MULTI-VITAMINS) TABS Take 1 tablet by mouth.    . oxyCODONE-acetaminophen (ROXICET) 5-325 MG tablet Take 1 tablet by mouth every 4 (four) hours as needed for severe pain. 20 tablet 0  . ranitidine (ZANTAC) 150 MG tablet Take 150 mg by mouth 2 (two) times daily.     No current facility-administered medications for this visit.     Review of Systems A multi-point review of systems was asked and was negative except for the findings documented in the history of present illness  Physical Exam Blood pressure 103/69, pulse 79, temperature 98.2 F (36.8 C), temperature source Oral, height 5' 2.5" (1.588 m), weight 90.266 kg (199 lb), last menstrual period 02/04/2015. CONSTITUTIONAL: No acute distress. EYES: Pupils are equal, round, and reactive to light, Sclera are non-icteric. EARS, NOSE, MOUTH AND THROAT: The oropharynx is clear. The oral mucosa is pink and moist. Hearing is intact to voice. LYMPH NODES:  Lymph nodes in the neck are normal. RESPIRATORY:  Lungs are clear. There is normal respiratory effort, with equal breath sounds bilaterally, and without pathologic use of accessory muscles. CARDIOVASCULAR: Heart is regular without murmurs, gallops, or rubs. GI: The abdomen is soft, nontender, and nondistended. There are no palpable masses. There is no hepatosplenomegaly. There are normal bowel sounds in all quadrants. GU: Rectal deferred.   MUSCULOSKELETAL: Normal muscle strength and tone. No cyanosis or edema.   SKIN: Turgor is good and there are no  pathologic skin lesions or ulcers. NEUROLOGIC: Motor and sensation is grossly normal. Cranial nerves are grossly intact. PSYCH:  Oriented to person, place and time. Affect is normal.  Data Reviewed Images and labs reviewed. Labs within normal limits and images consistent with cholelithiasis without cholecystitis. I have personally reviewed the patient's imaging, laboratory findings and medical records.    Assessment    Biliary colic     Plan    Discussed in detail the treatment options of biliary colic. Patient states that she has been ready for surgery since her first visit to the emergency department as she is scared of another attack. Discussed at length my available operative dates which she does not wish to wait. Offered to schedule her surgery for one of my partners and she is willing to proceed with this plan. Plan for surgery on December 20.  I discussed the procedure in detail.  The patient was given Agricultural engineer.  We discussed the risks and benefits of a laparoscopic cholecystectomy and possible cholangiogram including, but not limited to bleeding, infection, injury to surrounding structures such as the intestine or liver, bile leak, retained gallstones, need to convert to an open procedure, prolonged diarrhea, blood clots such as  DVT, common bile duct injury, anesthesia risks, and possible need for additional procedures.  The likelihood of improvement in symptoms and return to the patient's normal status is good. We discussed the typical post-operative recovery course.      Time spent with the patient was 60 minutes, with more than 50% of the time spent in face-to-face education, counseling and care coordination.     Ricarda Frame, MD FACS General Surgeon 02/09/2015, 8:07 PM

## 2015-02-09 NOTE — Telephone Encounter (Signed)
Patient needs a Laparoscopic Cholecystectomy.   Please read below.

## 2015-02-09 NOTE — Telephone Encounter (Signed)
Called and spoke to her grandmother (caregiver) and told her that Zoe Hunter's surgery will be on February 16, 2015 with Dr. Egbert GaribaldiBird. They agreed and they will go forward with surgery.   Angie, can you please schedule pre-admit (phone interview) and surgery. Thank you.

## 2015-02-10 NOTE — Telephone Encounter (Signed)
I have sent a surgery form for this request. Waiting on confirmation of the date and pre op time. Once obtained i will contact the patient and document in chart. Thank you.

## 2015-02-11 ENCOUNTER — Telehealth: Payer: Self-pay | Admitting: Surgery

## 2015-02-11 NOTE — Telephone Encounter (Signed)
Pt advised of pre op date/time and sx date. Sx: 02/16/15 with Dr Chales AbrahamsBird--Lap Chole Pre op: 02/12/15 between 1-5--phone.

## 2015-02-12 ENCOUNTER — Encounter: Payer: Self-pay | Admitting: *Deleted

## 2015-02-12 ENCOUNTER — Other Ambulatory Visit: Payer: BLUE CROSS/BLUE SHIELD

## 2015-02-12 NOTE — Pre-Procedure Instructions (Signed)
NOTED THAT PTS K+ WAS LOW ON 02-08-15 ER VISIT-I ASKED PT TO COME IN ON Monday(THE DAY BEFORE HER SURGERY SO WE COULD RECHECK HER POTASSIUM AND SHE SAID SHE COULD NOT COME IN DUE TO HER JOB-I TOLD HER THAT IF HER K+WAS TOO LOW THEN HER SURGERY WOULD BE CANCELLED ON Tuesday. SHE VOICED UNDERSTANDING OF THIS.

## 2015-02-15 NOTE — Patient Instructions (Signed)
  Your procedure is scheduled on: 02-16-15 Report to [facility] 2ND FLOOR To find out your arrival time please call [phone] between 1PM - 3PM on 02-15-15  Remember: Instructions that are not followed completely may result in serious medical risk, up to and including death, or upon the discretion of your surgeon and anesthesiologist your surgery may need to be rescheduled.    X____ 1. Do not eat food or drink liquids after midnight. No gum chewing or hard candies.     X____ 2. No Alcohol for 24 hours before or after surgery.   ____ 3. Bring all medications with you on the day of surgery if instructed.    ____ 4. Notify your doctor if there is any change in your medical condition     (cold, fever, infections).     Do not wear jewelry, make-up, hairpins, clips or nail polish.  Do not wear lotions, powders, or perfumes. You may wear deodorant.  Do not shave 48 hours prior to surgery. Men may shave face and neck.  Do not bring valuables to the hospital.    [facility] any belongings or valuables.               Contacts, dentures or bridgework may not be worn into surgery.  Leave your suitcase in the car. After surgery it may be brought to your room.  For patients admitted to the hospital, discharge time is determined by your treatment team.   Patients discharged the day of surgery will not be allowed to drive home.   Please read over the following fact sheets that you were given:      __X__ Take these medicines the morning of surgery with A SIP OF WATER:    1. ZANTAC  2. TAKE A ZANTAC Monday NIGHT  3.   4.  5.  6.  ____ Fleet Enema (as directed)   ____ Use CHG Soap as directed  ____ Use inhalers on the day of surgery  ____ Stop metformin 2 days prior to surgery    ____ Take 1/2 of usual insulin dose the night before surgery and none on the morning of surgery.   ____ Stop Coumadin/Plavix/aspirin-N/A  ____ Stop  Anti-inflammatories-NO NSAIDS OR ASA PRODUCTS   ____ Stop supplements until after surgery.    ____ Bring C-Pap to the hospital.

## 2015-02-16 ENCOUNTER — Encounter: Payer: Self-pay | Admitting: *Deleted

## 2015-02-16 ENCOUNTER — Ambulatory Visit
Admission: RE | Admit: 2015-02-16 | Discharge: 2015-02-16 | Disposition: A | Discharge status: Home / Self Care | Payer: BLUE CROSS/BLUE SHIELD | Source: Ambulatory Visit | Attending: Surgery | Admitting: Surgery

## 2015-02-16 ENCOUNTER — Encounter
Admission: RE | Disposition: A | Discharge status: Home / Self Care | Payer: Self-pay | Source: Ambulatory Visit | Attending: Surgery

## 2015-02-16 ENCOUNTER — Ambulatory Visit: Payer: BLUE CROSS/BLUE SHIELD | Admitting: Anesthesiology

## 2015-02-16 ENCOUNTER — Ambulatory Visit: Payer: Self-pay | Admitting: General Surgery

## 2015-02-16 DIAGNOSIS — K219 Gastro-esophageal reflux disease without esophagitis: Secondary | ICD-10-CM | POA: Insufficient documentation

## 2015-02-16 DIAGNOSIS — F319 Bipolar disorder, unspecified: Secondary | ICD-10-CM | POA: Diagnosis not present

## 2015-02-16 DIAGNOSIS — F909 Attention-deficit hyperactivity disorder, unspecified type: Secondary | ICD-10-CM | POA: Insufficient documentation

## 2015-02-16 DIAGNOSIS — Z8249 Family history of ischemic heart disease and other diseases of the circulatory system: Secondary | ICD-10-CM | POA: Insufficient documentation

## 2015-02-16 DIAGNOSIS — K8020 Calculus of gallbladder without cholecystitis without obstruction: Secondary | ICD-10-CM

## 2015-02-16 DIAGNOSIS — Z79899 Other long term (current) drug therapy: Secondary | ICD-10-CM | POA: Diagnosis not present

## 2015-02-16 DIAGNOSIS — Z419 Encounter for procedure for purposes other than remedying health state, unspecified: Secondary | ICD-10-CM

## 2015-02-16 DIAGNOSIS — Z9889 Other specified postprocedural states: Secondary | ICD-10-CM | POA: Insufficient documentation

## 2015-02-16 DIAGNOSIS — K8010 Calculus of gallbladder with chronic cholecystitis without obstruction: Secondary | ICD-10-CM | POA: Insufficient documentation

## 2015-02-16 HISTORY — PX: CHOLECYSTECTOMY: SHX55

## 2015-02-16 LAB — POCT PREGNANCY, URINE: Preg Test, Ur: NEGATIVE

## 2015-02-16 LAB — POCT I-STAT 4, (NA,K, GLUC, HGB,HCT)
GLUCOSE: 81 mg/dL (ref 65–99)
HEMATOCRIT: 39 % (ref 36.0–46.0)
HEMOGLOBIN: 13.3 g/dL (ref 12.0–15.0)
Potassium: 3.7 mmol/L (ref 3.5–5.1)
Sodium: 140 mmol/L (ref 135–145)

## 2015-02-16 LAB — URINE DRUG SCREEN, QUALITATIVE (ARMC ONLY)
AMPHETAMINES, UR SCREEN: NOT DETECTED
BENZODIAZEPINE, UR SCRN: NOT DETECTED
Barbiturates, Ur Screen: NOT DETECTED
COCAINE METABOLITE, UR ~~LOC~~: NOT DETECTED
Cannabinoid 50 Ng, Ur ~~LOC~~: POSITIVE — AB
MDMA (ECSTASY) UR SCREEN: NOT DETECTED
METHADONE SCREEN, URINE: NOT DETECTED
OPIATE, UR SCREEN: NOT DETECTED
Phencyclidine (PCP) Ur S: NOT DETECTED
Tricyclic, Ur Screen: NOT DETECTED

## 2015-02-16 SURGERY — LAPAROSCOPIC CHOLECYSTECTOMY
Anesthesia: General | Site: Abdomen | Wound class: Clean Contaminated

## 2015-02-16 MED ORDER — PROPOFOL 10 MG/ML IV BOLUS
INTRAVENOUS | Status: DC | PRN
  Administered 2015-02-16: 180 mg via INTRAVENOUS

## 2015-02-16 MED ORDER — LIDOCAINE HCL (PF) 1 % IJ SOLN
INTRAMUSCULAR | Status: AC
  Filled 2015-02-16: qty 2

## 2015-02-16 MED ORDER — FENTANYL CITRATE (PF) 100 MCG/2ML IJ SOLN
INTRAMUSCULAR | Status: AC
  Administered 2015-02-16: 25 ug via INTRAVENOUS
  Filled 2015-02-16: qty 2

## 2015-02-16 MED ORDER — LIDOCAINE HCL (CARDIAC) 20 MG/ML IV SOLN
INTRAVENOUS | Status: DC | PRN
  Administered 2015-02-16: 100 mg via INTRAVENOUS

## 2015-02-16 MED ORDER — LACTATED RINGERS IV SOLN
INTRAVENOUS | Status: DC
  Administered 2015-02-16: 50 mL/h via INTRAVENOUS
  Administered 2015-02-16: 12:00:00 via INTRAVENOUS

## 2015-02-16 MED ORDER — BUPIVACAINE HCL (PF) 0.25 % IJ SOLN
INTRAMUSCULAR | Status: AC
  Filled 2015-02-16: qty 30

## 2015-02-16 MED ORDER — ONDANSETRON HCL 4 MG/2ML IJ SOLN
INTRAMUSCULAR | Status: DC | PRN
  Administered 2015-02-16: 4 mg via INTRAVENOUS

## 2015-02-16 MED ORDER — CEFOXITIN SODIUM-DEXTROSE 2-2.2 GM-% IV SOLR (PREMIX)
2.0000 g | Freq: Four times a day (QID) | INTRAVENOUS | Status: DC
  Administered 2015-02-16: 2000 mg via INTRAVENOUS

## 2015-02-16 MED ORDER — CHLORHEXIDINE GLUCONATE 4 % EX LIQD: 1.0000 "application " | Freq: Once | CUTANEOUS | Status: DC

## 2015-02-16 MED ORDER — CHLORHEXIDINE GLUCONATE 4 % EX LIQD: 1.0000 | Freq: Once | CUTANEOUS | Status: DC

## 2015-02-16 MED ORDER — NEOSTIGMINE METHYLSULFATE 10 MG/10ML IV SOLN
INTRAVENOUS | Status: DC | PRN
  Administered 2015-02-16: 4 mg via INTRAVENOUS

## 2015-02-16 MED ORDER — THROMBIN 5000 UNITS EX SOLR
CUTANEOUS | Status: AC
  Filled 2015-02-16: qty 5000

## 2015-02-16 MED ORDER — ONDANSETRON HCL 4 MG PO TABS: 4.0000 mg | ORAL_TABLET | Freq: Three times a day (TID) | ORAL | Status: DC | PRN

## 2015-02-16 MED ORDER — FENTANYL CITRATE (PF) 100 MCG/2ML IJ SOLN
25.0000 ug | INTRAMUSCULAR | Status: AC | PRN
  Administered 2015-02-16 (×6): 25 ug via INTRAVENOUS

## 2015-02-16 MED ORDER — GLYCOPYRROLATE 0.2 MG/ML IJ SOLN
INTRAMUSCULAR | Status: DC | PRN
  Administered 2015-02-16: 0.6 mg via INTRAVENOUS

## 2015-02-16 MED ORDER — DEXAMETHASONE SODIUM PHOSPHATE 10 MG/ML IJ SOLN
INTRAMUSCULAR | Status: DC | PRN
  Administered 2015-02-16: 10 mg via INTRAVENOUS

## 2015-02-16 MED ORDER — ONDANSETRON HCL 4 MG/2ML IJ SOLN
INTRAMUSCULAR | Status: AC
  Filled 2015-02-16: qty 2

## 2015-02-16 MED ORDER — CEFOXITIN SODIUM-DEXTROSE 2-2.2 GM-% IV SOLR (PREMIX)
INTRAVENOUS | Status: AC
Start: 2015-02-16 — End: 2015-02-16
  Administered 2015-02-16: 2000 mg via INTRAVENOUS
  Filled 2015-02-16: qty 50

## 2015-02-16 MED ORDER — MIDAZOLAM HCL 2 MG/2ML IJ SOLN
INTRAMUSCULAR | Status: DC | PRN
  Administered 2015-02-16: 2 mg via INTRAVENOUS

## 2015-02-16 MED ORDER — FAMOTIDINE 20 MG PO TABS
ORAL_TABLET | ORAL | Status: AC
Start: 2015-02-16 — End: 2015-02-16
  Administered 2015-02-16: 20 mg
  Filled 2015-02-16: qty 1

## 2015-02-16 MED ORDER — FENTANYL CITRATE (PF) 100 MCG/2ML IJ SOLN
INTRAMUSCULAR | Status: DC | PRN
  Administered 2015-02-16 (×5): 50 ug via INTRAVENOUS

## 2015-02-16 MED ORDER — OXYCODONE-ACETAMINOPHEN 5-325 MG PO TABS: 1.0000 | ORAL_TABLET | Freq: Four times a day (QID) | ORAL | Status: DC | PRN

## 2015-02-16 MED ORDER — BUPIVACAINE HCL 0.25 % IJ SOLN
INTRAMUSCULAR | Status: DC | PRN
  Administered 2015-02-16: 22 mL

## 2015-02-16 MED ORDER — ONDANSETRON HCL 4 MG/2ML IJ SOLN
4.0000 mg | Freq: Once | INTRAMUSCULAR | Status: AC | PRN
  Administered 2015-02-16: 4 mg via INTRAVENOUS

## 2015-02-16 MED ORDER — ROCURONIUM BROMIDE 100 MG/10ML IV SOLN
INTRAVENOUS | Status: DC | PRN
  Administered 2015-02-16: 10 mg via INTRAVENOUS
  Administered 2015-02-16: 30 mg via INTRAVENOUS
  Administered 2015-02-16: 10 mg via INTRAVENOUS

## 2015-02-16 MED ORDER — FENTANYL CITRATE (PF) 100 MCG/2ML IJ SOLN
INTRAMUSCULAR | Status: AC
  Filled 2015-02-16: qty 2

## 2015-02-16 SURGICAL SUPPLY — 50 items
APPLICATOR SURGIFLO ENDO (HEMOSTASIS) ×2 IMPLANT
APPLIER CLIP 5 13 M/L LIGAMAX5 (MISCELLANEOUS) ×2
BAG COUNTER SPONGE EZ (MISCELLANEOUS) IMPLANT
BULB RESERV EVAC DRAIN JP 100C (MISCELLANEOUS) IMPLANT
CANISTER SUCT 1200ML W/VALVE (MISCELLANEOUS) ×2 IMPLANT
CATH CHOLANG 76X19 KUMAR (CATHETERS) IMPLANT
CHLORAPREP W/TINT 26ML (MISCELLANEOUS) ×2 IMPLANT
CLIP APPLIE 5 13 M/L LIGAMAX5 (MISCELLANEOUS) ×1 IMPLANT
DEFOGGER SCOPE WARMER CLEARIFY (MISCELLANEOUS) ×2 IMPLANT
DISSECTOR KITTNER STICK (MISCELLANEOUS) ×1 IMPLANT
DISSECTORS/KITTNER STICK (MISCELLANEOUS) ×2
DRAIN CHANNEL JP 19F (MISCELLANEOUS) IMPLANT
DRAPE C-ARM XRAY 36X54 (DRAPES) IMPLANT
DRAPE SHEET LG 3/4 BI-LAMINATE (DRAPES) ×2 IMPLANT
DRAPE UTILITY 15X26 TOWEL STRL (DRAPES) ×4 IMPLANT
DRSG TEGADERM 2-3/8X2-3/4 SM (GAUZE/BANDAGES/DRESSINGS) ×8 IMPLANT
DRSG TELFA 3X8 NADH (GAUZE/BANDAGES/DRESSINGS) ×2 IMPLANT
ENDOPOUCH RETRIEVER 10 (MISCELLANEOUS) ×2 IMPLANT
GLOVE BIO SURGEON STRL SZ7.5 (GLOVE) ×2 IMPLANT
GOWN STRL REUS W/ TWL LRG LVL3 (GOWN DISPOSABLE) ×2 IMPLANT
GOWN STRL REUS W/ TWL XL LVL3 (GOWN DISPOSABLE) ×1 IMPLANT
GOWN STRL REUS W/TWL LRG LVL3 (GOWN DISPOSABLE) ×2
GOWN STRL REUS W/TWL XL LVL3 (GOWN DISPOSABLE) ×1
HEMOSTAT SURGICEL 2X3 (HEMOSTASIS) ×2 IMPLANT
IRRIGATION STRYKERFLOW (MISCELLANEOUS) ×1 IMPLANT
IRRIGATOR STRYKERFLOW (MISCELLANEOUS) ×2
IV NS 1000ML (IV SOLUTION) ×2
IV NS 1000ML BAXH (IV SOLUTION) ×2 IMPLANT
LABEL OR SOLS (LABEL) IMPLANT
NDL SAFETY ECLIPSE 18X1.5 (NEEDLE) ×1 IMPLANT
NEEDLE HYPO 18GX1.5 SHARP (NEEDLE) ×1
NEEDLE HYPO 25X1 1.5 SAFETY (NEEDLE) ×2 IMPLANT
NS IRRIG 500ML POUR BTL (IV SOLUTION) ×2 IMPLANT
PACK LAP CHOLECYSTECTOMY (MISCELLANEOUS) ×2 IMPLANT
PAD GROUND ADULT SPLIT (MISCELLANEOUS) ×2 IMPLANT
SCISSORS METZENBAUM CVD 33 (INSTRUMENTS) ×2 IMPLANT
SLEEVE ADV FIXATION 5X100MM (TROCAR) ×4 IMPLANT
SPOGE SURGIFLO 8M (HEMOSTASIS) ×1
SPONGE SURGIFLO 8M (HEMOSTASIS) ×1 IMPLANT
STRAP SAFETY BODY (MISCELLANEOUS) ×2 IMPLANT
STRIP CLOSURE SKIN 1/2X4 (GAUZE/BANDAGES/DRESSINGS) ×2 IMPLANT
SUT ETHILON 3-0 FS-10 30 BLK (SUTURE)
SUT VIC AB 0 UR5 27 (SUTURE) ×4 IMPLANT
SUT VIC AB 4-0 FS2 27 (SUTURE) ×2 IMPLANT
SUTURE EHLN 3-0 FS-10 30 BLK (SUTURE) IMPLANT
SWABSTK COMLB BENZOIN TINCTURE (MISCELLANEOUS) ×2 IMPLANT
SYRINGE 10CC LL (SYRINGE) ×2 IMPLANT
TROCAR XCEL BLUNT TIP 100MML (ENDOMECHANICALS) ×2 IMPLANT
TROCAR Z-THREAD OPTICAL 5X100M (TROCAR) ×2 IMPLANT
TUBING INSUFFLATOR HI FLOW (MISCELLANEOUS) ×2 IMPLANT

## 2015-02-16 NOTE — H&P (View-Only) (Signed)
Patient ID: Zoe Hunter, female   DOB: 05/05/1995, 19 y.o.   MRN: [id number]  CC: ABDOMINAL PAIN  HPI Zoe Hunter is a 19 y.o. female presents to clinic for evaluation of right upper quadrant abdominal pain for which she's been seen in the emergency department twice. She was in the emergency department on Saturday and Sunday of the previous weekend with acute onset right upper quadrant pain that started between 30 minutes an hour after eating. The pain did not subside until she was treated in the emergency artery. Since that time the pain has been primarily gone however she has had a fear of eating. She denies any fevers, chills, chest pain, shortness breath, diarrhea, constipation. She did have nausea and vomiting with the worse of the pain over the weekend. Prior to this weekend she states she's had some mild right upper quadrant bowel pain but nothing like this before. She lives with her grandparents will be leaving town on Christmas Day.  HPI  Past Medical History  Diagnosis Date  . Bipolar disorder (HCC)   . GERD (gastroesophageal reflux disease)   . Cholelithiasis 01/2015  . ADHD (attention deficit hyperactivity disorder)     Past Surgical History  Procedure Laterality Date  . Tonsillectomy      Family History  Problem Relation Age of Onset  . Hypertension Mother   . Hypertension Maternal Grandmother   . Cancer Other     Social History Social History  Substance Use Topics  . Smoking status: Never Smoker   . Smokeless tobacco: Never Used  . Alcohol Use: No    No Known Allergies  Current Outpatient Prescriptions  Medication Sig Dispense Refill  . amphetamine-dextroamphetamine (ADDERALL) 30 MG tablet Take 30 mg by mouth daily.    Marland Kitchen. atomoxetine (STRATTERA) 40 MG capsule Take 40 mg by mouth daily.    . cholecalciferol (VITAMIN D) 1000 UNITS tablet Take 1,000 Units by mouth daily.    Marland Kitchen. lamoTRIgine (LAMICTAL) 150 MG tablet Take 1 tablet by mouth.    . Multiple Vitamin  (MULTI-VITAMINS) TABS Take 1 tablet by mouth.    . oxyCODONE-acetaminophen (ROXICET) 5-325 MG tablet Take 1 tablet by mouth every 4 (four) hours as needed for severe pain. 20 tablet 0  . ranitidine (ZANTAC) 150 MG tablet Take 150 mg by mouth 2 (two) times daily.     No current facility-administered medications for this visit.     Review of Systems A multi-point review of systems was asked and was negative except for the findings documented in the history of present illness  Physical Exam Blood pressure 103/69, pulse 79, temperature 98.2 F (36.8 C), temperature source Oral, height 5' 2.5" (1.588 m), weight 90.266 kg (199 lb), last menstrual period 02/04/2015. CONSTITUTIONAL: No acute distress. EYES: Pupils are equal, round, and reactive to light, Sclera are non-icteric. EARS, NOSE, MOUTH AND THROAT: The oropharynx is clear. The oral mucosa is pink and moist. Hearing is intact to voice. LYMPH NODES:  Lymph nodes in the neck are normal. RESPIRATORY:  Lungs are clear. There is normal respiratory effort, with equal breath sounds bilaterally, and without pathologic use of accessory muscles. CARDIOVASCULAR: Heart is regular without murmurs, gallops, or rubs. GI: The abdomen is soft, nontender, and nondistended. There are no palpable masses. There is no hepatosplenomegaly. There are normal bowel sounds in all quadrants. GU: Rectal deferred.   MUSCULOSKELETAL: Normal muscle strength and tone. No cyanosis or edema.   SKIN: Turgor is good and there are no  pathologic skin lesions or ulcers. NEUROLOGIC: Motor and sensation is grossly normal. Cranial nerves are grossly intact. PSYCH:  Oriented to person, place and time. Affect is normal.  Data Reviewed Images and labs reviewed. Labs within normal limits and images consistent with cholelithiasis without cholecystitis. I have personally reviewed the patient's imaging, laboratory findings and medical records.    Assessment    Biliary colic     Plan    Discussed in detail the treatment options of biliary colic. Patient states that she has been ready for surgery since her first visit to the emergency department as she is scared of another attack. Discussed at length my available operative dates which she does not wish to wait. Offered to schedule her surgery for one of my partners and she is willing to proceed with this plan. Plan for surgery on December 20.  I discussed the procedure in detail.  The patient was given educational material.  We discussed the risks and benefits of a laparoscopic cholecystectomy and possible cholangiogram including, but not limited to bleeding, infection, injury to surrounding structures such as the intestine or liver, bile leak, retained gallstones, need to convert to an open procedure, prolonged diarrhea, blood clots such as  DVT, common bile duct injury, anesthesia risks, and possible need for additional procedures.  The likelihood of improvement in symptoms and return to the patient's normal status is good. We discussed the typical post-operative recovery course.      Time spent with the patient was 60 minutes, with more than 50% of the time spent in face-to-face education, counseling and care coordination.     Iara Monds, MD FACS General Surgeon 02/09/2015, 8:07 PM     

## 2015-02-16 NOTE — Op Note (Signed)
02/16/2015  11:36 AM  PATIENT:  Zoe Hunter  [age] y.o. female  PRE-OPERATIVE DIAGNOSIS:  CALCULUS OF GALLBLADDER, chronic calculus cholecystitis  POST-OPERATIVE DIAGNOSIS:  Same  PROCEDURE:  Procedure(s): LAPAROSCOPIC CHOLECYSTECTOMY (N/A)  SURGEON:  Surgeon(s) and Role:    * Natale LayMark Maddelyn Rocca, MD - Primary   ANESTHESIA: Gen.     SPECIMEN: Gallbladder and contents    EBL: 100 cc  Findings: Chronic calculus cholecystitis.  Description of procedure:   With informed consent supine position general endotracheal anesthesia the patient's left arm was padded and tucked at her side and her abdomen was sterilely prepped and draped with chlor prep solution.  A timeout was observed.  A 12 mm blunt Hassan trocar was placed under direct visualization through an infraumbilical transversely oriented skin incision. Stay sutures were passed to the fascia. The peritoneum was entered sharply between hemostats and Metzenbaum scissors. Pneumoperitoneum to 15 mmHg was achieved. The patient was then positioned in reverse Trendelenburg. A 5 mm operating trocar was placed in the epigastric region. 2 5 mm operating trochars are placed in the right lateral abdomen.  The gallbladder was placed on tension. Lateral traction was achieved on Hartman's pouch. The hepatoduodenal ligament was then dissected revealing a cystic duct which was rather short and anterior cystic artery also short. Critical view of safety cholecystectomy was achieved. The cystic duct was doubly clipped on the portal side singly clipped on the gallbladder side and divided sharply. The anterior branch was similarly divided. The gallbladder was then retrieved off the gallbladder fossa and a posterior cystic artery branch was divided with hemoclips. There was some continued oozing from this area which was controlled with a combination of cautery and Surgicel along with Surgi-Flo with thrombin.  The specimen was captured in an Endo Catch device and  utilizing a 5 mm surgical telescope from the epigastric region was removed under direct visualization demonstrating no evidence of injury to bowel from trocar insertion at the umbilicus.  The right upper quadrant was copiously irrigated during the operation and aspirated of all blood and fluid prior to removal of trochars under direct visualization. The infraumbilical fascial defect was reapproximated with several interrupted simple and figure-of-eight #0 Vicryl sutures in vertical orientation.   20 cc of 0.25% plain Marcaine was infiltrated along all fascial and skin incisions prior to closure. 4-0 Vicryls suture was applied in subcuticular fashion to reapproximate skin edges. Benzoin Steri-Strips Telfa and Tegaderm were then applied and the patient was subsequently extubated and taken to the recovery room in stable and satisfactory condition by anesthesia services.   Zoe KempMark A Gema Ringold, MD, FACS

## 2015-02-16 NOTE — Transfer of Care (Signed)
Immediate Anesthesia Transfer of Care Note  Patient: Zoe Hunter  Procedure(s) Performed: Procedure(s): LAPAROSCOPIC CHOLECYSTECTOMY (N/A)  Patient Location: PACU  Anesthesia Type:General  Level of Consciousness: awake, alert , oriented and patient cooperative  Airway & Oxygen Therapy: Patient Spontanous Breathing and Patient connected to face mask oxygen  Post-op Assessment: Report given to RN, Post -op Vital signs reviewed and stable and Patient moving all extremities X 4  Post vital signs: Reviewed and stable  Last Vitals:  Filed Vitals:   02/16/15 0758  BP: 126/75  Pulse: 74  Temp: 36.3 C  Resp: 16    Complications: No apparent anesthesia complications

## 2015-02-16 NOTE — Anesthesia Postprocedure Evaluation (Signed)
Anesthesia Post Note  Patient: Eleonore Chiquitomily K Celmer  Procedure(s) Performed: Procedure(s) (LRB): LAPAROSCOPIC CHOLECYSTECTOMY (N/A)  Patient location during evaluation: PACU Anesthesia Type: General Level of consciousness: awake and alert and oriented Pain management: pain level controlled Vital Signs Assessment: post-procedure vital signs reviewed and stable Respiratory status: spontaneous breathing Cardiovascular status: blood pressure returned to baseline Anesthetic complications: no    Last Vitals:  Filed Vitals:   02/16/15 1308 02/16/15 1414  BP: 129/76 147/92  Pulse: 86 90  Temp: 36.7 C   Resp: 18 16    Last Pain:  Filed Vitals:   02/16/15 1417  PainSc: 2                  Bralen Wiltgen

## 2015-02-16 NOTE — Anesthesia Procedure Notes (Signed)
Procedure Name: Intubation Date/Time: 02/16/2015 9:29 AM Performed by: Michaele OfferSAVAGE, Joseandres Mazer Pre-anesthesia Checklist: Patient identified, Emergency Drugs available, Suction available, Patient being monitored and Timeout performed Patient Re-evaluated:Patient Re-evaluated prior to inductionOxygen Delivery Method: Circle system utilized Preoxygenation: Pre-oxygenation with 100% oxygen Intubation Type: IV induction Ventilation: Mask ventilation without difficulty Laryngoscope Size: Mac and 3 Grade View: Grade I Tube type: Oral Tube size: 7.0 mm Number of attempts: 1 Airway Equipment and Method: Rigid stylet Placement Confirmation: ETT inserted through vocal cords under direct vision,  positive ETCO2 and breath sounds checked- equal and bilateral Secured at: 21 cm Tube secured with: Tape Dental Injury: Teeth and Oropharynx as per pre-operative assessment

## 2015-02-16 NOTE — Discharge Instructions (Addendum)
AMBULATORY SURGERY  DISCHARGE INSTRUCTIONS   1) The drugs that you were given will stay in your system until tomorrow so for the next 24 hours you should not:  A) Drive an automobile B) Make any legal decisions C) Drink any alcoholic beverage   2) You may resume regular meals tomorrow.  Today it is better to start with liquids and gradually work up to solid foods.  You may eat anything you prefer, but it is better to start with liquids, then soup and crackers, and gradually work up to solid foods.   3) Please notify your doctor immediately if you have any unusual bleeding, trouble breathing, redness and pain at the surgery site, drainage, fever, or pain not relieved by medication. 4)   5) Your post-operative visit with Dr.                                     is: Date:                        Time:    Please call to schedule your post-operative visit.  6) Additional Instructions:         Laparoscopic Cholecystectomy, Care After These instructions give you information about caring for yourself after your procedure. Your doctor may also give you more specific instructions. Call your doctor if you have any problems or questions after your procedure. HOME CARE Incision Care Follow instructions from your doctor about how to take care of your cuts from surgery (incisions). Make sure you: Wash your hands with soap and water before you change your bandage (dressing). If you cannot use soap and water, use hand sanitizer. Change your bandage as told by your doctor. Leave stitches (sutures), skin glue, or skin tape (adhesive) strips in place. They may need to stay in place for 2 weeks or longer. If tape strips get loose and curl up, you may trim the loose edges. Do not remove tape strips completely unless your doctor says it is okay. Do not take baths, swim, or use a hot tub until your doctor says it is okay. Ask your doctor if you can take showers. You may only be allowed to take sponge  baths. General Instructions Take over-the-counter and prescription medicines only as told by your doctor. Do not drive or use heavy machinery while taking prescription pain medicine. Return to your normal diet as told by your doctor. Do not lift anything that is heavier than 10 lb (4.5 kg). Do not play contact sports for 1 week or until your doctor says it is okay. GET HELP IF: You have redness, swelling, or pain at the site of your surgical cuts. You have fluid, blood, or pus coming from your cuts. You notice a bad smell coming from your cut area. Your surgical cuts break open. You have a fever. GET HELP RIGHT AWAY IF:  You have a rash. You have trouble breathing. You have chest pain. You have pain in your shoulders (shoulder strap areas) that is getting worse. You pass out (faint) or feel dizzy while you are standing. You have very bad pain in your belly (abdomen). You feel sick to your stomach (nauseous) or throw up (vomit) for more than 1 day.   This information is not intended to replace advice given to you by your health care provider. Make sure you discuss any questions you have with  your health care provider.   Document Released: 11/23/2007 Document Revised: 11/04/2014 Document Reviewed: 09/25/2012 Elsevier Interactive Patient Education [employer]2016 [employer] Inc.

## 2015-02-16 NOTE — Interval H&P Note (Signed)
History and Physical Interval Note:  02/16/2015 8:05 AM  Zoe Hunter  has presented today for surgery, with the diagnosis of CALCULUS OF GALLBLADDER  The various methods of treatment have been discussed with the patient and family. After consideration of risks, benefits and other options for treatment, the patient has consented to  Procedure(s): LAPAROSCOPIC CHOLECYSTECTOMY (N/A) as a surgical intervention .  The patient's history has been reviewed, patient examined, no change in status, stable for surgery.  I have reviewed the patient's chart and labs.  Questions were answered to the patient's satisfaction.    I personally reviewed her history along with her prior laboratory indices and ultrasound findings. I discussed with her again the procedure and all of her questions were answered. In addition, I spoke to her family in the waiting room prior to surgery.  Natale LayMark Aerionna Moravek

## 2015-02-16 NOTE — Anesthesia Preprocedure Evaluation (Addendum)
Anesthesia Evaluation  Patient identified by MRN, date of birth, ID band Patient awake    Reviewed: Allergy & Precautions, NPO status , Patient's Chart, lab work & pertinent test results  Airway Mallampati: II  TM Distance: >3 FB Neck ROM: Full    Dental no notable dental hx.    Pulmonary neg pulmonary ROS,    Pulmonary exam normal        Cardiovascular negative cardio ROS Normal cardiovascular exam     Neuro/Psych PSYCHIATRIC DISORDERS Bipolar Disorder ADHDnegative neurological ROS     GI/Hepatic Neg liver ROS, GERD  Medicated and Controlled,  Endo/Other  negative endocrine ROS  Renal/GU negative Renal ROS  negative genitourinary   Musculoskeletal negative musculoskeletal ROS (+)   Abdominal Normal abdominal exam  (+)   Peds negative pediatric ROS (+)  Hematology negative hematology ROS (+)   Anesthesia Other Findings   Reproductive/Obstetrics                             Anesthesia Physical Anesthesia Plan  ASA: II  Anesthesia Plan: General   Post-op Pain Management:    Induction: Intravenous  Airway Management Planned: Oral ETT  Additional Equipment:   Intra-op Plan:   Post-operative Plan: Extubation in OR  Informed Consent: I have reviewed the patients History and Physical, chart, labs and discussed the procedure including the risks, benefits and alternatives for the proposed anesthesia with the patient or authorized representative who has indicated his/her understanding and acceptance.   Dental advisory given  Plan Discussed with: CRNA and Surgeon  Anesthesia Plan Comments:         Anesthesia Quick Evaluation

## 2015-02-17 LAB — SURGICAL PATHOLOGY

## 2015-02-19 ENCOUNTER — Other Ambulatory Visit: Payer: Self-pay

## 2015-02-19 DIAGNOSIS — F329 Major depressive disorder, single episode, unspecified: Secondary | ICD-10-CM | POA: Insufficient documentation

## 2015-02-19 DIAGNOSIS — F319 Bipolar disorder, unspecified: Secondary | ICD-10-CM | POA: Insufficient documentation

## 2015-02-19 DIAGNOSIS — F988 Other specified behavioral and emotional disorders with onset usually occurring in childhood and adolescence: Secondary | ICD-10-CM | POA: Insufficient documentation

## 2015-02-19 DIAGNOSIS — F32A Depression, unspecified: Secondary | ICD-10-CM | POA: Insufficient documentation

## 2015-02-23 ENCOUNTER — Encounter: Payer: Self-pay | Admitting: Surgery

## 2015-02-23 ENCOUNTER — Ambulatory Visit (INDEPENDENT_AMBULATORY_CARE_PROVIDER_SITE_OTHER): Payer: BLUE CROSS/BLUE SHIELD | Admitting: Surgery

## 2015-02-23 VITALS — BP 125/67 | HR 69 | Temp 98.7°F | Ht 62.5 in | Wt 198.0 lb

## 2015-02-23 DIAGNOSIS — K8020 Calculus of gallbladder without cholecystitis without obstruction: Secondary | ICD-10-CM

## 2015-02-23 NOTE — Progress Notes (Signed)
Outpatient postop visit  02/23/2015  Zoe Chiquitomily K Boldon is an 19 y.o. female.    Procedure: Laparoscopic cholecystectomy by Dr. 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  Medications reviewed.    Physical Exam:  BP 125/67 mmHg  Pulse 69  Temp(Src) 98.7 F (37.1 C) (Oral)  Ht 5' 2.5" (1.588 m)  Wt 198 lb (89.812 kg)  BMI 35.62 kg/m2  LMP 02/16/2015 (Exact Date)    PE: No icterus no jaundice soft nontender abdomen no erythema minimal resolving ecchymosis nontender calves    Assessment/Plan:  Patient doing very well pathology was reviewed. Recommend follow up on an as-needed basis no heavy lifting for 2 more weeks.  Lattie Hawichard E Yenifer Saccente, MD, FACS

## 2015-05-05 ENCOUNTER — Emergency Department
Admission: EM | Admit: 2015-05-05 | Discharge: 2015-05-05 | Disposition: A | Discharge status: Home / Self Care | Payer: BLUE CROSS/BLUE SHIELD | Attending: Emergency Medicine | Admitting: Emergency Medicine

## 2015-05-05 ENCOUNTER — Encounter: Payer: Self-pay | Admitting: Emergency Medicine

## 2015-05-05 DIAGNOSIS — Z3202 Encounter for pregnancy test, result negative: Secondary | ICD-10-CM | POA: Diagnosis not present

## 2015-05-05 DIAGNOSIS — R1084 Generalized abdominal pain: Secondary | ICD-10-CM | POA: Insufficient documentation

## 2015-05-05 DIAGNOSIS — E669 Obesity, unspecified: Secondary | ICD-10-CM | POA: Diagnosis not present

## 2015-05-05 DIAGNOSIS — R461 Bizarre personal appearance: Secondary | ICD-10-CM | POA: Diagnosis not present

## 2015-05-05 DIAGNOSIS — R112 Nausea with vomiting, unspecified: Secondary | ICD-10-CM | POA: Insufficient documentation

## 2015-05-05 DIAGNOSIS — R197 Diarrhea, unspecified: Secondary | ICD-10-CM | POA: Diagnosis not present

## 2015-05-05 DIAGNOSIS — Z792 Long term (current) use of antibiotics: Secondary | ICD-10-CM | POA: Insufficient documentation

## 2015-05-05 DIAGNOSIS — Z79899 Other long term (current) drug therapy: Secondary | ICD-10-CM | POA: Insufficient documentation

## 2015-05-05 LAB — COMPREHENSIVE METABOLIC PANEL
ALBUMIN: 4.3 g/dL (ref 3.5–5.0)
ALT: 16 U/L (ref 14–54)
AST: 22 U/L (ref 15–41)
Alkaline Phosphatase: 87 U/L (ref 38–126)
Anion gap: 9 (ref 5–15)
BILIRUBIN TOTAL: 0.5 mg/dL (ref 0.3–1.2)
BUN: 9 mg/dL (ref 6–20)
CHLORIDE: 106 mmol/L (ref 101–111)
CO2: 24 mmol/L (ref 22–32)
Calcium: 8.7 mg/dL — ABNORMAL LOW (ref 8.9–10.3)
Creatinine, Ser: 0.59 mg/dL (ref 0.44–1.00)
GFR calc Af Amer: >60 mL/min (ref >60)
GFR calc non Af Amer: >60 mL/min (ref >60)
GLUCOSE: 125 mg/dL — AB (ref 65–99)
POTASSIUM: 3.5 mmol/L (ref 3.5–5.1)
Sodium: 139 mmol/L (ref 135–145)
TOTAL PROTEIN: 7.4 g/dL (ref 6.5–8.1)

## 2015-05-05 LAB — URINALYSIS COMPLETE WITH MICROSCOPIC (ARMC ONLY)
BACTERIA UA: NONE SEEN
Bilirubin Urine: NEGATIVE
Glucose, UA: NEGATIVE mg/dL
Hgb urine dipstick: NEGATIVE
Nitrite: NEGATIVE
PROTEIN: NEGATIVE mg/dL
RBC / HPF: NONE SEEN RBC/hpf (ref 0–5)
Specific Gravity, Urine: 1.021 (ref 1.005–1.030)
pH: 9 — ABNORMAL HIGH (ref 5.0–8.0)

## 2015-05-05 LAB — POCT PREGNANCY, URINE: PREG TEST UR: NEGATIVE

## 2015-05-05 LAB — CBC
HEMATOCRIT: 38.1 % (ref 35.0–47.0)
Hemoglobin: 12.7 g/dL (ref 12.0–16.0)
MCH: 25.8 pg — AB (ref 26.0–34.0)
MCHC: 33.3 g/dL (ref 32.0–36.0)
MCV: 77.4 fL — AB (ref 80.0–100.0)
Platelets: 248 10*3/uL (ref 150–440)
RBC: 4.92 MIL/uL (ref 3.80–5.20)
RDW: 15.1 % — AB (ref 11.5–14.5)
WBC: 10.4 10*3/uL (ref 3.6–11.0)

## 2015-05-05 LAB — LIPASE, BLOOD: Lipase: 20 U/L (ref 11–51)

## 2015-05-05 MED ORDER — SODIUM CHLORIDE 0.9 % IV BOLUS (SEPSIS)
1000.0000 mL | Freq: Once | INTRAVENOUS | Status: AC
  Administered 2015-05-05: 1000 mL via INTRAVENOUS

## 2015-05-05 MED ORDER — ONDANSETRON HCL 4 MG/2ML IJ SOLN
4.0000 mg | Freq: Once | INTRAMUSCULAR | Status: AC
Start: 2015-05-05 — End: 2015-05-05
  Administered 2015-05-05: 4 mg via INTRAVENOUS

## 2015-05-05 MED ORDER — SODIUM CHLORIDE 0.9 % IV BOLUS (SEPSIS): 1000.0000 mL | Freq: Once | INTRAVENOUS | Status: DC

## 2015-05-05 MED ORDER — ONDANSETRON HCL 4 MG/2ML IJ SOLN
INTRAMUSCULAR | Status: AC
  Filled 2015-05-05: qty 2

## 2015-05-05 MED ORDER — ONDANSETRON 4 MG PO TBDP: 4.0000 mg | ORAL_TABLET | Freq: Three times a day (TID) | ORAL | Status: DC | PRN

## 2015-05-05 MED ORDER — PROMETHAZINE HCL 25 MG/ML IJ SOLN
25.0000 mg | Freq: Once | INTRAMUSCULAR | Status: AC
  Administered 2015-05-05: 25 mg via INTRAVENOUS
  Filled 2015-05-05 (×2): qty 1

## 2015-05-05 MED ORDER — HYDROMORPHONE HCL 1 MG/ML IJ SOLN
1.0000 mg | Freq: Once | INTRAMUSCULAR | Status: AC
  Administered 2015-05-05: 1 mg via INTRAVENOUS
  Filled 2015-05-05: qty 1

## 2015-05-05 MED ORDER — DIPHENOXYLATE-ATROPINE 2.5-0.025 MG PO TABS
1.0000 | ORAL_TABLET | Freq: Once | ORAL | Status: AC
  Administered 2015-05-05: 1 via ORAL
  Filled 2015-05-05: qty 1

## 2015-05-05 MED ORDER — LOPERAMIDE HCL 2 MG PO TABS: 2.0000 mg | ORAL_TABLET | Freq: Four times a day (QID) | ORAL | Status: DC | PRN

## 2015-05-05 MED ORDER — ONDANSETRON 4 MG PO TBDP
ORAL_TABLET | ORAL | Status: AC
  Filled 2015-05-05: qty 1

## 2015-05-05 MED ORDER — ONDANSETRON 4 MG PO TBDP: 4.0000 mg | ORAL_TABLET | Freq: Once | ORAL | Status: DC | PRN

## 2015-05-05 NOTE — Discharge Instructions (Signed)
Please take a clear liquid diet for the next 2 days. Then advance to a bland BRAT diet as tolerated. Please return to the emergency department if you develop inability to keep down fluids, abdominal pain, lightheadedness or fainting, or any other symptoms concerning to you.

## 2015-05-05 NOTE — ED Provider Notes (Signed)
[facility] Emergency Department Provider Note  ____________________________________________  Time seen: Approximately 3:17 PM  I have reviewed the triage vital signs and the nursing notes.   HISTORY  Chief Complaint Abdominal Pain and Emesis    HPI Zoe Hunter is a 20 y.o. female status post cholecystectomy with bipolar disorder, GERD, ADHD presenting with abdominal pain, nausea vomiting and diarrhea. H&H NEC last night. This morning she woke up and had multiple episodes of nausea and vomiting associated with watery diarrhea. She has had diffuse abdominal pain and does not state that it is localized to the right upper quadrant. She has had no associated fever, chills. Possible sick contact with a family member with similar symptoms.   Past Medical History  Diagnosis Date  . Bipolar disorder (HCC)   . GERD (gastroesophageal reflux disease)   . Cholelithiasis 01/2015  . ADHD (attention deficit hyperactivity disorder)     Patient Active Problem List   Diagnosis Date Noted  . ADD (attention deficit disorder) 02/19/2015  . Bipolar affective disorder (HCC) 02/19/2015  . Clinical depression 02/19/2015  . Cholelithiasis 01/28/2015    Past Surgical History  Procedure Laterality Date  . Tonsillectomy    . Wisdom tooth extraction    . Cholecystectomy N/A 02/16/2015    Procedure: LAPAROSCOPIC CHOLECYSTECTOMY;  Surgeon: Natale Lay, MD;  Location: [facility] ORS;  Service: General;  Laterality: N/A;    Current Outpatient Rx  Name  Route  Sig  Dispense  Refill  . amphetamine-dextroamphetamine (ADDERALL) 30 MG tablet   Oral   Take 30 mg by mouth daily.         Marland Kitchen atomoxetine (STRATTERA) 40 MG capsule   Oral   Take 40 mg by mouth daily.         Marland Kitchen dicyclomine (BENTYL) 20 MG tablet   Oral   Take 1 tablet by mouth 3 (three) times daily.      0   . lamoTRIgine (LAMICTAL) 150 MG tablet   Oral   Take 150 mg by mouth daily.         Marland Kitchen loperamide (IMODIUM  A-D) 2 MG tablet   Oral   Take 1 tablet (2 mg total) by mouth 4 (four) times daily as needed for diarrhea or loose stools.   12 tablet   0   . ondansetron (ZOFRAN ODT) 4 MG disintegrating tablet   Oral   Take 1 tablet (4 mg total) by mouth every 8 (eight) hours as needed for nausea or vomiting.   20 tablet   0     Allergies Review of patient's allergies indicates no known allergies.  Family History  Problem Relation Age of Onset  . Hypertension Mother   . Hypertension Maternal Grandmother   . Cancer Other     Social History Social History  Substance Use Topics  . Smoking status: Never Smoker   . Smokeless tobacco: Never Used     Comment: boyfriend smokes  . Alcohol Use: No    Review of Systems Constitutional: No fever/chills. No lightheadedness or syncope. Eyes: No visual changes. ENT: No sore throat. Cardiovascular: Denies chest pain, palpitations. Respiratory: Denies shortness of breath.  No cough. Gastrointestinal: Has a diffuse abdominal pain.  Positive nausea, positive vomiting.  Positive watery nonbloody diarrhea.  No constipation. Genitourinary: Negative for dysuria. Musculoskeletal: Negative for back pain. Skin: Negative for rash. Neurological: Negative for headaches, focal weakness or numbness.  10-point ROS otherwise negative.  ____________________________________________   PHYSICAL EXAM:  VITAL SIGNS:  ED Triage Vitals  Enc Vitals Group     BP 05/05/15 1242 134/92 mmHg     Pulse Rate 05/05/15 1242 78     Resp 05/05/15 1242 18     Temp 05/05/15 1242 98.4 F (36.9 C)     Temp Source 05/05/15 1242 Oral     SpO2 05/05/15 1242 100 %     Weight 05/05/15 1242 200 lb (90.719 kg)     Height 05/05/15 1242 5\' 3"  (1.6 m)     Head Cir --      Peak Flow --      Pain Score 05/05/15 1242 10     Pain Loc --      Pain Edu? --      Excl. in GC? --     Constitutional: Patient is alert and oriented and answering questions appropriately. She occasionally  moans and is uncomfortable appearing but nontoxic.  Eyes: Conjunctivae are normal.  EOMI. no scleral icterus. Head: Atraumatic. Nose: No congestion/rhinnorhea. Mouth/Throat: Mucous membranes are dry.  Neck: No stridor.  Supple.  Full range of motion without pain. No meningismus. Cardiovascular: Normal rate, regular rhythm. No murmurs, rubs or gallops.  Respiratory: Normal respiratory effort.  No retractions. Lungs CTAB.  No wheezes, rales or ronchi. Gastrointestinal: Abdomen is obese, soft, and diffusely tender in all quadrants without focality. No rebound or guarding. No peritoneal signs. Musculoskeletal: No LE edema.  Neurologic:  Normal speech and language. No gross focal neurologic deficits are appreciated.  Skin:  Skin is warm, dry and intact. No rash noted. Psychiatric: Normal mood with bizarre affect. Speech and behavior are normal.  Normal judgement.  ____________________________________________   LABS (all labs ordered are listed, but only abnormal results are displayed)  Labs Reviewed  COMPREHENSIVE METABOLIC PANEL - Abnormal; Notable for the following:    Glucose, Bld 125 (*)    Calcium 8.7 (*)    All other components within normal limits  CBC - Abnormal; Notable for the following:    MCV 77.4 (*)    MCH 25.8 (*)    RDW 15.1 (*)    All other components within normal limits  URINALYSIS COMPLETEWITH MICROSCOPIC ([facility] ONLY) - Abnormal; Notable for the following:    Color, Urine YELLOW (*)    APPearance CLOUDY (*)    Ketones, ur TRACE (*)    pH 9.0 (*)    Leukocytes, UA TRACE (*)    Squamous Epithelial / LPF 0-5 (*)    All other components within normal limits  LIPASE, BLOOD  POC URINE PREG, ED  POCT PREGNANCY, URINE   ____________________________________________  EKG  Not indicated ____________________________________________  RADIOLOGY  No results found.  ____________________________________________   PROCEDURES  Procedure(s) performed:  None  Critical Care performed: No ____________________________________________   INITIAL IMPRESSION / ASSESSMENT AND PLAN / ED COURSE  Pertinent labs & imaging results that were available during my care of the patient were reviewed by me and considered in my medical decision making (see chart for details).  20 y.o. female with acute onset of diffuse nonfocal abdominal pain with nausea vomiting and diarrhea. It is possible that the patient has a foodborne or viral restauranteur intestinal illness. At this time, she is afebrile and has no focality on her abdominal exam. It is less likely that she has no acute surgical pathology. We will treat her symptomatically and reevaluate after her treatment.  ----------------------------------------- 10:02 PM on 05/05/2015 -----------------------------------------  The patient was feeling significantly better. Her pain had resolved and  her nausea was gone. She was able to tolerate by mouth. She was discharged in stable condition.  ____________________________________________  FINAL CLINICAL IMPRESSION(S) / ED DIAGNOSES  Final diagnoses:  Nausea vomiting and diarrhea  Diffuse abdominal pain      NEW MEDICATIONS STARTED DURING THIS VISIT:  Discharge Medication List as of 05/05/2015  6:19 PM    START taking these medications   Details  loperamide (IMODIUM A-D) 2 MG tablet Take 1 tablet (2 mg total) by mouth 4 (four) times daily as needed for diarrhea or loose stools., Starting 05/05/2015, Until Discontinued, Print    ondansetron (ZOFRAN ODT) 4 MG disintegrating tablet Take 1 tablet (4 mg total) by mouth every 8 (eight) hours as needed for nausea or vomiting., Starting 05/05/2015, Until Discontinued, Print         Rockne Menghini, MD 05/05/15 2202

## 2015-05-05 NOTE — ED Notes (Signed)
Patient presents to the ED with right upper quadrant abdominal pain that started suddenly today.  Patient is doubled over in pain.  Patient reports vomiting more than 10 times today.  Patient reports similar pain before she had her galbladder taken out in December.  Patient is moaning in triage.  Patient also reports diarrhea x 2 today.

## 2015-05-05 NOTE — ED Notes (Signed)
Pt offered fluids. Pt states she feels better

## 2015-12-10 ENCOUNTER — Encounter: Payer: Self-pay | Admitting: Emergency Medicine

## 2015-12-10 ENCOUNTER — Emergency Department
Admission: EM | Admit: 2015-12-10 | Discharge: 2015-12-10 | Disposition: A | Discharge status: Home / Self Care | Payer: BLUE CROSS/BLUE SHIELD | Attending: Emergency Medicine | Admitting: Emergency Medicine

## 2015-12-10 DIAGNOSIS — R1084 Generalized abdominal pain: Secondary | ICD-10-CM | POA: Diagnosis not present

## 2015-12-10 DIAGNOSIS — F909 Attention-deficit hyperactivity disorder, unspecified type: Secondary | ICD-10-CM | POA: Diagnosis not present

## 2015-12-10 DIAGNOSIS — R112 Nausea with vomiting, unspecified: Secondary | ICD-10-CM | POA: Insufficient documentation

## 2015-12-10 DIAGNOSIS — R1110 Vomiting, unspecified: Secondary | ICD-10-CM

## 2015-12-10 LAB — CBC
HEMATOCRIT: 38 % (ref 35.0–47.0)
Hemoglobin: 13.1 g/dL (ref 12.0–16.0)
MCH: 27.2 pg (ref 26.0–34.0)
MCHC: 34.4 g/dL (ref 32.0–36.0)
MCV: 79 fL — AB (ref 80.0–100.0)
Platelets: 257 10*3/uL (ref 150–440)
RBC: 4.8 MIL/uL (ref 3.80–5.20)
RDW: 15.2 % — AB (ref 11.5–14.5)
WBC: 9.6 10*3/uL (ref 3.6–11.0)

## 2015-12-10 LAB — COMPREHENSIVE METABOLIC PANEL
ALT: 13 U/L — ABNORMAL LOW (ref 14–54)
ANION GAP: 8 (ref 5–15)
AST: 21 U/L (ref 15–41)
Albumin: 4.5 g/dL (ref 3.5–5.0)
Alkaline Phosphatase: 86 U/L (ref 38–126)
BILIRUBIN TOTAL: 0.6 mg/dL (ref 0.3–1.2)
BUN: 6 mg/dL (ref 6–20)
CHLORIDE: 105 mmol/L (ref 101–111)
CO2: 24 mmol/L (ref 22–32)
Calcium: 9.3 mg/dL (ref 8.9–10.3)
Creatinine, Ser: 0.62 mg/dL (ref 0.44–1.00)
Glucose, Bld: 126 mg/dL — ABNORMAL HIGH (ref 65–99)
POTASSIUM: 3.7 mmol/L (ref 3.5–5.1)
Sodium: 137 mmol/L (ref 135–145)
TOTAL PROTEIN: 8.1 g/dL (ref 6.5–8.1)

## 2015-12-10 LAB — LIPASE, BLOOD: Lipase: 18 U/L (ref 11–51)

## 2015-12-10 MED ORDER — LORAZEPAM 1 MG PO TABS: 1.0000 mg | ORAL_TABLET | Freq: Two times a day (BID) | ORAL | 0 refills | Status: AC

## 2015-12-10 MED ORDER — ONDANSETRON 4 MG PO TBDP: 4.0000 mg | ORAL_TABLET | Freq: Three times a day (TID) | ORAL | 0 refills | Status: DC | PRN

## 2015-12-10 MED ORDER — LORAZEPAM 2 MG/ML IJ SOLN
1.0000 mg | Freq: Once | INTRAMUSCULAR | Status: AC
  Administered 2015-12-10: 1 mg via INTRAVENOUS
  Filled 2015-12-10: qty 1

## 2015-12-10 MED ORDER — METOCLOPRAMIDE HCL 5 MG/ML IJ SOLN
10.0000 mg | Freq: Once | INTRAMUSCULAR | Status: AC
  Administered 2015-12-10: 10 mg via INTRAVENOUS
  Filled 2015-12-10 (×2): qty 2

## 2015-12-10 MED ORDER — SODIUM CHLORIDE 0.9 % IV SOLN
1000.0000 mL | Freq: Once | INTRAVENOUS | Status: AC
  Administered 2015-12-10: 1000 mL via INTRAVENOUS

## 2015-12-10 MED ORDER — FAMOTIDINE 20 MG PO TABS: 20.0000 mg | ORAL_TABLET | Freq: Two times a day (BID) | ORAL | 1 refills | Status: DC

## 2015-12-10 MED ORDER — ONDANSETRON HCL 4 MG/2ML IJ SOLN
4.0000 mg | Freq: Once | INTRAMUSCULAR | Status: AC
  Administered 2015-12-10: 4 mg via INTRAVENOUS
  Filled 2015-12-10: qty 2

## 2015-12-10 NOTE — ED Triage Notes (Signed)
Pt presents with abd pain and n/v started last night.

## 2015-12-10 NOTE — ED Provider Notes (Signed)
[facility] Emergency Department Provider Note        Time seen: ----------------------------------------- 4:49 PM on 12/10/2015 -----------------------------------------    I have reviewed the triage vital signs and the nursing notes.   HISTORY  Chief Complaint Nausea and Emesis    HPI Zoe Hunter is a 20 y.o. female who presents to the ER for abdominal pain and nausea with vomiting that started last night. Patient states she does have a history of this although no specific diagnosis is known. Patient states this happens to her every few months, nothing makes it seemingly better or worse. She had a cholecystectomy for similar symptoms in December but symptoms have persisted on and off. She denies drinking or smoking, denies diarrhea. She has diffuse abdominal pain.   Past Medical History:  Diagnosis Date  . ADHD (attention deficit hyperactivity disorder)   . Bipolar disorder (HCC)   . Cholelithiasis 01/2015  . GERD (gastroesophageal reflux disease)     Patient Active Problem List   Diagnosis Date Noted  . ADD (attention deficit disorder) 02/19/2015  . Bipolar affective disorder (HCC) 02/19/2015  . Clinical depression 02/19/2015  . Cholelithiasis 01/28/2015    Past Surgical History:  Procedure Laterality Date  . CHOLECYSTECTOMY N/A 02/16/2015   Procedure: LAPAROSCOPIC CHOLECYSTECTOMY;  Surgeon: Natale LayMark Bird, MD;  Location: [facility] ORS;  Service: General;  Laterality: N/A;  . TONSILLECTOMY    . WISDOM TOOTH EXTRACTION      Allergies Review of patient's allergies indicates no known allergies.  Social History Social History  Substance Use Topics  . Smoking status: Never Smoker  . Smokeless tobacco: Never Used     Comment: boyfriend smokes  . Alcohol use No    Review of Systems Constitutional: Negative for fever. Cardiovascular: Negative for chest pain. Respiratory: Negative for shortness of breath. Gastrointestinal: Positive for  abdominal pain, vomiting Genitourinary: Negative for dysuria. Musculoskeletal: Negative for back pain. Skin: Negative for rash. Neurological: Negative for headaches, focal weakness or numbness.  10-point ROS otherwise negative.  ____________________________________________   PHYSICAL EXAM:  VITAL SIGNS: ED Triage Vitals [12/10/15 1613]  Enc Vitals Group     BP      Pulse Rate 67     Resp 18     Temp 98.8 F (37.1 C)     Temp Source Oral     SpO2 99 %     Weight      Height      Head Circumference      Peak Flow      Pain Score 8     Pain Loc      Pain Edu?      Excl. in GC?     Constitutional: Alert and oriented. Mild distress Eyes: Conjunctivae are normal. PERRL. Normal extraocular movements. ENT   Head: Normocephalic and atraumatic.   Nose: No congestion/rhinnorhea.   Mouth/Throat: Mucous membranes are moist.   Neck: No stridor. Cardiovascular: Normal rate, regular rhythm. No murmurs, rubs, or gallops. Respiratory: Normal respiratory effort without tachypnea nor retractions. Breath sounds are clear and equal bilaterally. No wheezes/rales/rhonchi. Gastrointestinal: Nonfocal tenderness, normal bowel sounds. Musculoskeletal: Nontender with normal range of motion in all extremities. No lower extremity tenderness nor edema. Neurologic:  Normal speech and language. No gross focal neurologic deficits are appreciated.  Skin:  Skin is warm, dry and intact. No rash noted. Psychiatric: Mood and affect are normal. Speech and behavior are normal.  ____________________________________________  ED COURSE:  Pertinent labs & imaging results that  were available during my care of the patient were reviewed by me and considered in my medical decision making (see chart for details). Clinical Course  Patient presents actively vomiting. She will receive IV fluids, antiemetics and anxiolytics.  Procedures ____________________________________________   LABS (pertinent  positives/negatives)  Labs Reviewed  COMPREHENSIVE METABOLIC PANEL - Abnormal; Notable for the following:       Result Value   Glucose, Bld 126 (*)    ALT 13 (*)    All other components within normal limits  CBC - Abnormal; Notable for the following:    MCV 79.0 (*)    RDW 15.2 (*)    All other components within normal limits  LIPASE, BLOOD  URINALYSIS COMPLETEWITH MICROSCOPIC ([facility] ONLY)  CBC WITH DIFFERENTIAL/PLATELET  POC URINE PREG, ED   ______________________________________  FINAL ASSESSMENT AND PLAN  Abdominal pain, vomiting  Plan: Patient with labs and imaging as dictated above. Patient is in no acute distress, currently she is feeling better. I will place her on an acids as well as anxiolytics and antiemetics. She is stable for discharge at this time.   Zanita Filbert, MD   Note: This dictation was prepared with Dragon dictation. Any transcriptional errors that result from this process are unintentional    Gissella Filbert, MD 12/10/15 1944

## 2015-12-10 NOTE — ED Notes (Signed)
Pt verbalized understanding of discharge instructions. NAD at this time. 

## 2015-12-17 ENCOUNTER — Other Ambulatory Visit: Payer: Self-pay | Admitting: Family Medicine

## 2015-12-17 DIAGNOSIS — R112 Nausea with vomiting, unspecified: Secondary | ICD-10-CM

## 2015-12-17 DIAGNOSIS — R1010 Upper abdominal pain, unspecified: Secondary | ICD-10-CM

## 2015-12-19 ENCOUNTER — Emergency Department: Payer: BLUE CROSS/BLUE SHIELD

## 2015-12-19 ENCOUNTER — Encounter: Payer: Self-pay | Admitting: Emergency Medicine

## 2015-12-19 ENCOUNTER — Emergency Department
Admission: EM | Admit: 2015-12-19 | Discharge: 2015-12-19 | Disposition: A | Discharge status: Home / Self Care | Payer: BLUE CROSS/BLUE SHIELD | Attending: Emergency Medicine | Admitting: Emergency Medicine

## 2015-12-19 DIAGNOSIS — R55 Syncope and collapse: Secondary | ICD-10-CM | POA: Insufficient documentation

## 2015-12-19 DIAGNOSIS — F909 Attention-deficit hyperactivity disorder, unspecified type: Secondary | ICD-10-CM | POA: Diagnosis not present

## 2015-12-19 DIAGNOSIS — R109 Unspecified abdominal pain: Secondary | ICD-10-CM | POA: Insufficient documentation

## 2015-12-19 DIAGNOSIS — Z79899 Other long term (current) drug therapy: Secondary | ICD-10-CM | POA: Diagnosis not present

## 2015-12-19 LAB — CBC
HCT: 36.9 % (ref 35.0–47.0)
Hemoglobin: 12.5 g/dL (ref 12.0–16.0)
MCH: 27.1 pg (ref 26.0–34.0)
MCHC: 33.9 g/dL (ref 32.0–36.0)
MCV: 80 fL (ref 80.0–100.0)
PLATELETS: 245 10*3/uL (ref 150–440)
RBC: 4.62 MIL/uL (ref 3.80–5.20)
RDW: 15.2 % — AB (ref 11.5–14.5)
WBC: 8.2 10*3/uL (ref 3.6–11.0)

## 2015-12-19 LAB — URINALYSIS COMPLETE WITH MICROSCOPIC (ARMC ONLY)
BACTERIA UA: NONE SEEN
BILIRUBIN URINE: NEGATIVE
GLUCOSE, UA: NEGATIVE mg/dL
HGB URINE DIPSTICK: NEGATIVE
Ketones, ur: NEGATIVE mg/dL
Leukocytes, UA: NEGATIVE
NITRITE: NEGATIVE
Protein, ur: NEGATIVE mg/dL
RBC / HPF: NONE SEEN RBC/hpf (ref 0–5)
Specific Gravity, Urine: 1.001 — ABNORMAL LOW (ref 1.005–1.030)
pH: 7 (ref 5.0–8.0)

## 2015-12-19 LAB — BASIC METABOLIC PANEL
Anion gap: 9 (ref 5–15)
BUN: 8 mg/dL (ref 6–20)
CHLORIDE: 105 mmol/L (ref 101–111)
CO2: 24 mmol/L (ref 22–32)
Calcium: 8.9 mg/dL (ref 8.9–10.3)
Creatinine, Ser: 0.55 mg/dL (ref 0.44–1.00)
GFR calc non Af Amer: >60 mL/min (ref >60)
Glucose, Bld: 83 mg/dL (ref 65–99)
POTASSIUM: 3.5 mmol/L (ref 3.5–5.1)
SODIUM: 138 mmol/L (ref 135–145)

## 2015-12-19 LAB — POCT PREGNANCY, URINE: Preg Test, Ur: NEGATIVE

## 2015-12-19 MED ORDER — SODIUM CHLORIDE 0.9 % IV SOLN
Freq: Once | INTRAVENOUS | Status: AC
  Administered 2015-12-19: 17:00:00 via INTRAVENOUS

## 2015-12-19 NOTE — ED Notes (Signed)
Patient transported to US 

## 2015-12-19 NOTE — ED Triage Notes (Addendum)
Pt was picking up lunch and had syncopal episode. Still feels a little lightheaded. Feeling fine prior to incident. Has had some vomiting that she is being seen for. Has appt GI on the 24th. No vomiting today. BP at office visit was 102 systolic on 10/17

## 2015-12-19 NOTE — ED Notes (Signed)
Pt given extra warm blanket.

## 2015-12-19 NOTE — ED Provider Notes (Signed)
[facility] Emergency Department Provider Note        Time seen: ----------------------------------------- 4:57 PM on 12/19/2015 -----------------------------------------    I have reviewed the triage vital signs and the nursing notes.   HISTORY  Chief Complaint Loss of Consciousness    HPI Zoe Hunter is a 20 y.o. female who presents to the ER after having had a syncopal episode at lunch. Patient states she has some abdominal pain and then she started to feel lightheaded. The next thing she knew she was waking up before asking her how she fell. She denies any other pain or injuries from passing out. She has a GI appointment 2 days from now due to persistent abdominal pain. Patient states she had eaten breakfast and does not feel dehydrated. She has no complaints at this time.   Past Medical History:  Diagnosis Date  . ADHD (attention deficit hyperactivity disorder)   . Bipolar disorder (HCC)   . Cholelithiasis 01/2015  . GERD (gastroesophageal reflux disease)     Patient Active Problem List   Diagnosis Date Noted  . ADD (attention deficit disorder) 02/19/2015  . Bipolar affective disorder (HCC) 02/19/2015  . Clinical depression 02/19/2015  . Cholelithiasis 01/28/2015    Past Surgical History:  Procedure Laterality Date  . CHOLECYSTECTOMY N/A 02/16/2015   Procedure: LAPAROSCOPIC CHOLECYSTECTOMY;  Surgeon: Natale Lay, MD;  Location: [facility] ORS;  Service: General;  Laterality: N/A;  . TONSILLECTOMY    . WISDOM TOOTH EXTRACTION      Allergies Review of patient's allergies indicates no known allergies.  Social History Social History  Substance Use Topics  . Smoking status: Never Smoker  . Smokeless tobacco: Never Used     Comment: boyfriend smokes  . Alcohol use No    Review of Systems Constitutional: Negative for fever. Cardiovascular: Negative for chest pain. Respiratory: Negative for shortness of breath. Gastrointestinal: Positive  for recent abdominal pain Genitourinary: Negative for dysuria. Musculoskeletal: Negative for back pain. Skin: Negative for rash. Neurological: Negative for headaches, focal weakness or numbness.  10-point ROS otherwise negative.  ____________________________________________   PHYSICAL EXAM:  VITAL SIGNS: ED Triage Vitals  Enc Vitals Group     BP 12/19/15 1505 (!) 102/50     Pulse Rate 12/19/15 1505 70     Resp 12/19/15 1507 18     Temp 12/19/15 1507 98.6 F (37 C)     Temp Source 12/19/15 1507 Oral     SpO2 12/19/15 1507 100 %     Weight 12/19/15 1507 200 lb (90.7 kg)     Height 12/19/15 1507 5\' 3"  (1.6 m)     Head Circumference --      Peak Flow --      Pain Score --      Pain Loc --      Pain Edu? --      Excl. in GC? --     Constitutional: Alert and oriented. Well appearing and in no distress. Eyes: Conjunctivae are normal. PERRL. Normal extraocular movements. ENT   Head: Normocephalic and atraumatic.   Nose: No congestion/rhinnorhea.   Mouth/Throat: Mucous membranes are moist.   Neck: No stridor. Cardiovascular: Normal rate, regular rhythm. No murmurs, rubs, or gallops. Respiratory: Normal respiratory effort without tachypnea nor retractions. Breath sounds are clear and equal bilaterally. No wheezes/rales/rhonchi. Gastrointestinal: Soft and nontender. Normal bowel sounds Musculoskeletal: Nontender with normal range of motion in all extremities. No lower extremity tenderness nor edema. Neurologic:  Normal speech and language. No  gross focal neurologic deficits are appreciated.  Skin:  Skin is warm, dry and intact. No rash noted. Psychiatric: Mood and affect are normal. Speech and behavior are normal.  ____________________________________________  EKG: Interpreted by me.Sinus rhythm with a rate of 60 bpm, normal PR interval, normal QRS, normal QT, normal axis.  ____________________________________________  ED COURSE:  Pertinent labs & imaging  results that were available during my care of the patient were reviewed by me and considered in my medical decision making (see chart for details). Clinical Course  Patient is in no distress, we will check orthostatics, give IV fluids and reevaluate.  Procedures ____________________________________________   LABS (pertinent positives/negatives)  Labs Reviewed  CBC - Abnormal; Notable for the following:       Result Value   RDW 15.2 (*)    All other components within normal limits  URINALYSIS COMPLETEWITH MICROSCOPIC ([facility] ONLY) - Abnormal; Notable for the following:    Color, Urine COLORLESS (*)    APPearance CLEAR (*)    Specific Gravity, Urine 1.001 (*)    Squamous Epithelial / LPF 0-5 (*)    All other components within normal limits  BASIC METABOLIC PANEL  POCT PREGNANCY, URINE  POC URINE PREG, ED    RADIOLOGY  Right upper quadrant ultrasound  IMPRESSION: 1. No acute findings. 2. Status post cholecystectomy.  ____________________________________________  FINAL ASSESSMENT AND PLAN  Syncope  Plan: Patient with labs and imaging as dictated above. Patient is in no acute distress, likely syncopized from a vagal response. She is currently improved. She is [facility] due to her chronic pain and nausea. This is overall much improved since her last visit with me. She is stable for discharge at this time.   Caylan FilbertWilliams, Kimberlye Dilger E, MD   Note: This dictation was prepared with Dragon dictation. Any transcriptional errors that result from this process are unintentional    Bettyanne FilbertJonathan E Juanita Streight, MD 12/19/15 [phone]

## 2015-12-19 NOTE — ED Notes (Addendum)
Pt states she was getting her lunch and was signing her name and states she started losing her peripheral vision at the same time she was having abdominal pain along with a slight headache. Pt states she did eat breakfast this morning. Pt states she hasn't had any vomiting since last visit except 1x the night she was discharged.  Pt also states when she passed out she may have hit her head because she has tenderness to the back of her head in the occipital region.

## 2015-12-21 ENCOUNTER — Ambulatory Visit: Admission: RE | Admit: 2015-12-21 | Payer: BLUE CROSS/BLUE SHIELD | Source: Ambulatory Visit

## 2016-05-05 ENCOUNTER — Encounter
Admission: RE | Disposition: A | Discharge status: Home / Self Care | Payer: Self-pay | Source: Ambulatory Visit | Attending: Gastroenterology

## 2016-05-05 ENCOUNTER — Ambulatory Visit: Payer: BLUE CROSS/BLUE SHIELD | Admitting: Anesthesiology

## 2016-05-05 ENCOUNTER — Ambulatory Visit
Admission: RE | Admit: 2016-05-05 | Discharge: 2016-05-05 | Disposition: A | Discharge status: Home / Self Care | Payer: BLUE CROSS/BLUE SHIELD | Source: Ambulatory Visit | Attending: Gastroenterology | Admitting: Gastroenterology

## 2016-05-05 DIAGNOSIS — Z79899 Other long term (current) drug therapy: Secondary | ICD-10-CM | POA: Diagnosis not present

## 2016-05-05 DIAGNOSIS — K2960 Other gastritis without bleeding: Secondary | ICD-10-CM | POA: Diagnosis not present

## 2016-05-05 DIAGNOSIS — K228 Other specified diseases of esophagus: Secondary | ICD-10-CM | POA: Diagnosis not present

## 2016-05-05 DIAGNOSIS — K8020 Calculus of gallbladder without cholecystitis without obstruction: Secondary | ICD-10-CM | POA: Diagnosis not present

## 2016-05-05 DIAGNOSIS — F319 Bipolar disorder, unspecified: Secondary | ICD-10-CM | POA: Insufficient documentation

## 2016-05-05 DIAGNOSIS — K2210 Ulcer of esophagus without bleeding: Secondary | ICD-10-CM | POA: Diagnosis not present

## 2016-05-05 DIAGNOSIS — F909 Attention-deficit hyperactivity disorder, unspecified type: Secondary | ICD-10-CM | POA: Insufficient documentation

## 2016-05-05 DIAGNOSIS — K219 Gastro-esophageal reflux disease without esophagitis: Secondary | ICD-10-CM | POA: Diagnosis not present

## 2016-05-05 DIAGNOSIS — R1013 Epigastric pain: Secondary | ICD-10-CM | POA: Diagnosis present

## 2016-05-05 HISTORY — PX: ESOPHAGOGASTRODUODENOSCOPY (EGD) WITH PROPOFOL: SHX5813

## 2016-05-05 LAB — POCT PREGNANCY, URINE: Preg Test, Ur: NEGATIVE

## 2016-05-05 SURGERY — ESOPHAGOGASTRODUODENOSCOPY (EGD) WITH PROPOFOL
Anesthesia: General

## 2016-05-05 MED ORDER — LIDOCAINE HCL (PF) 1 % IJ SOLN
2.0000 mL | Freq: Once | INTRAMUSCULAR | Status: AC
  Administered 2016-05-05: 0.3 mL via INTRADERMAL
  Filled 2016-05-05: qty 2

## 2016-05-05 MED ORDER — PROPOFOL 500 MG/50ML IV EMUL
INTRAVENOUS | Status: AC
  Filled 2016-05-05: qty 50

## 2016-05-05 MED ORDER — SODIUM CHLORIDE 0.9 % IV SOLN
INTRAVENOUS | Status: DC
  Administered 2016-05-05: 1000 mL via INTRAVENOUS
  Administered 2016-05-05: 13:00:00 via INTRAVENOUS

## 2016-05-05 MED ORDER — PROPOFOL 500 MG/50ML IV EMUL
INTRAVENOUS | Status: DC | PRN
  Administered 2016-05-05: 125 ug/kg/min via INTRAVENOUS

## 2016-05-05 MED ORDER — SODIUM CHLORIDE 0.9 % IV SOLN: INTRAVENOUS | Status: DC

## 2016-05-05 MED ORDER — PROPOFOL 10 MG/ML IV BOLUS
INTRAVENOUS | Status: DC | PRN
  Administered 2016-05-05: 50 mg via INTRAVENOUS

## 2016-05-05 NOTE — Transfer of Care (Signed)
Immediate Anesthesia Transfer of Care Note  Patient: Zoe Hunter  Procedure(s) Performed: Procedure(s): ESOPHAGOGASTRODUODENOSCOPY (EGD) WITH PROPOFOL (N/A)  Patient Location: PACU and Endoscopy Unit  Anesthesia Type:General  Level of Consciousness: awake, alert  and oriented  Airway & Oxygen Therapy: Patient Spontanous Breathing and Patient connected to nasal cannula oxygen  Post-op Assessment: Report given to RN and Post -op Vital signs reviewed and stable  Post vital signs: Reviewed and stable  Last Vitals:  Vitals:   05/05/16 1224  BP: 109/62  Pulse: 86  Resp: 16  Temp: (!) 35.9 C    Last Pain:  Vitals:   05/05/16 1224  TempSrc: Tympanic         Complications: No apparent anesthesia complications

## 2016-05-05 NOTE — Anesthesia Preprocedure Evaluation (Signed)
Anesthesia Evaluation  Patient identified by MRN, date of birth, ID band Patient awake    Reviewed: Allergy & Precautions, H&P , NPO status , Patient's Chart, lab work & pertinent test results, reviewed documented beta blocker date and time   Airway Mallampati: II   Neck ROM: full    Dental  (+) Poor Dentition   Pulmonary neg pulmonary ROS,    Pulmonary exam normal        Cardiovascular negative cardio ROS Normal cardiovascular exam Rhythm:regular Rate:Normal     Neuro/Psych PSYCHIATRIC DISORDERS negative neurological ROS  negative psych ROS   GI/Hepatic negative GI ROS, Neg liver ROS, GERD  Medicated,  Endo/Other  negative endocrine ROS  Renal/GU negative Renal ROS  negative genitourinary   Musculoskeletal   Abdominal   Peds  Hematology negative hematology ROS (+)   Anesthesia Other Findings Past Medical History: No date: ADHD (attention deficit hyperactivity disorder) No date: Bipolar disorder (HCC) 01/2015: Cholelithiasis No date: GERD (gastroesophageal reflux disease) Past Surgical History: 02/16/2015: CHOLECYSTECTOMY N/A     Comment: Procedure: LAPAROSCOPIC CHOLECYSTECTOMY;                Surgeon: Natale LayMark Bird, MD;  Location: [facility] ORS;                Service: General;  Laterality: N/A; No date: TONSILLECTOMY No date: WISDOM TOOTH EXTRACTION BMI    Body Mass Index:  34.72 kg/m     Reproductive/Obstetrics negative OB ROS                             Anesthesia Physical Anesthesia Plan  ASA: II  Anesthesia Plan: General   Post-op Pain Management:    Induction:   Airway Management Planned:   Additional Equipment:   Intra-op Plan:   Post-operative Plan:   Informed Consent: I have reviewed the patients History and Physical, chart, labs and discussed the procedure including the risks, benefits and alternatives for the proposed anesthesia with the patient or authorized  representative who has indicated his/her understanding and acceptance.   Dental Advisory Given  Plan Discussed with: CRNA  Anesthesia Plan Comments:         Anesthesia Quick Evaluation

## 2016-05-05 NOTE — Op Note (Signed)
[facility] Gastroenterology Patient Name: Zoe Passeymily Marik Procedure Date: 05/05/2016 1:24 PM MRN: [id number] Account #: [id number] Date of Birth: 12/05/1995 Admit Type: Outpatient Age: [age] [facility] END[facility] ROOM 1 Gender: Female Note Status: Finalized Procedure:            Upper GI endoscopy Indications:          Epigastric abdominal pain, Nausea with vomiting Providers:            Christena DeemMartin U. Londa Mackowski, MD Referring MD:         Leretha DykesGloria P. Ludwig ClarksSantayana (Referring MD) Medicines:            Monitored Anesthesia Care Complications:        No immediate complications. Procedure:            Pre-Anesthesia Assessment:                       - ASA Grade Assessment: II - A patient with mild                        systemic disease.                       After obtaining informed consent, the endoscope was                        passed under direct vision. Throughout the procedure,                        the patient's blood pressure, pulse, and oxygen                        saturations were monitored continuously. The Endoscope                        was introduced through the mouth, and advanced to the                        third part of duodenum. The upper GI endoscopy was                        accomplished without difficulty. The patient tolerated                        the procedure well. Findings:      The Z-line was variable. Biopsies were taken with a cold forceps for       histology.      The exam of the esophagus was otherwise normal.      Scattered minimal inflammation characterized by congestion (edema) and       erythema was found in the gastric body. Biopsies were taken with a cold       forceps for histology. Biopsies were taken with a cold forceps for       Helicobacter pylori testing.      The exam of the stomach was otherwise normal.      The cardia and gastric fundus were normal on retroflexion.      The examined duodenum was normal. Impression:           - Z-line  variable. Biopsied.                       -  Bile gastritis. Biopsied.                       - Normal examined duodenum. Recommendation:       - Use Protonix (pantoprazole) 40 mg PO daily daily. Procedure Code(s):    --- Professional ---                       [phone], Esophagogastroduodenoscopy, flexible, transoral;                        with biopsy, single or multiple Diagnosis Code(s):    --- Professional ---                       K22.8, Other specified diseases of esophagus                       K29.60, Other gastritis without bleeding                       R10.13, Epigastric pain                       R11.2, Nausea with vomiting, unspecified CPT copyright 2016 American [employer] Association. All rights reserved. The codes documented in this report are preliminary and upon coder review may  be revised to meet current compliance requirements. Christena Deem, MD 05/05/2016 1:52:53 PM This report has been signed electronically. Number of Addenda: 0 Note Initiated On: 05/05/2016 1:24 PM      [facility]

## 2016-05-05 NOTE — H&P (Signed)
Outpatient short stay form Pre-procedure 05/05/2016 1:29 PM Zoe Hunter Zoe Donnan MD  Primary Physician: Dr. Luvenia StarchGloria Hunter  Reason for visit:  EGD  History of present illness:  Patient is a 21 year old female presenting today as above. She has had a history for several months now of episodes of nausea vomiting and epigastric abdominal pain. He seemed to be correlated with menses. She was placed on a proton pump inhibitor but has not been taking that. She rarely takes NSAIDs or aspirin products. We will occasionally take a BC powder. She has had a negative celiac panel. She is presenting today for further evaluation.    Current Facility-Administered Medications:  .  0.9 %  sodium chloride infusion, , Intravenous, Continuous, Zoe Hunter Zoe Nou, MD, Last Rate: 20 mL/hr at 05/05/16 1255, 1,000 mL at 05/05/16 1255 .  0.9 %  sodium chloride infusion, , Intravenous, Continuous, Zoe Hunter Zoe Hockley, MD  Prescriptions Prior to Admission  Medication Sig Dispense Refill Last Dose  . amphetamine-dextroamphetamine (ADDERALL) 30 MG tablet Take 30 mg by mouth daily.   Taking  . atomoxetine (STRATTERA) 40 MG capsule Take 40 mg by mouth daily.   Taking  . dicyclomine (BENTYL) 20 MG tablet Take 1 tablet by mouth 3 (three) times daily.  0 Taking  . famotidine (PEPCID) 20 MG tablet Take 1 tablet (20 mg total) by mouth 2 (two) times daily. 60 tablet 1   . lamoTRIgine (LAMICTAL) 150 MG tablet Take 150 mg by mouth daily.   Taking  . loperamide (IMODIUM A-D) 2 MG tablet Take 1 tablet (2 mg total) by mouth 4 (four) times daily as needed for diarrhea or loose stools. 12 tablet 0   . LORazepam (ATIVAN) 1 MG tablet Take 1 tablet (1 mg total) by mouth 2 (two) times daily. 20 tablet 0   . ondansetron (ZOFRAN ODT) 4 MG disintegrating tablet Take 1 tablet (4 mg total) by mouth every 8 (eight) hours as needed for nausea or vomiting. 20 tablet 0   . ondansetron (ZOFRAN ODT) 4 MG disintegrating tablet Take 1 tablet (4 mg total)  by mouth every 8 (eight) hours as needed for nausea or vomiting. 20 tablet 0      No Known Allergies   Past Medical History:  Diagnosis Date  . ADHD (attention deficit hyperactivity disorder)   . Bipolar disorder (HCC)   . Cholelithiasis 01/2015  . GERD (gastroesophageal reflux disease)     Review of systems:      Physical Exam    Heart and lungs: Regular rate and rhythm without rub or gallop, lungs are bilaterally clear.    HEENT: Normocephalic atraumatic eyes are anicteric    Other:     Pertinant exam for procedure: Soft nontender nondistended bowel sounds positive normoactive.    Planned proceedures: EGD and indicated procedures. I have discussed the risks benefits and complications of procedures to include not limited to bleeding, infection, perforation and the risk of sedation and the patient wishes to proceed.    Zoe Hunter Zoe Paul, MD Gastroenterology 05/05/2016  1:29 PM

## 2016-05-05 NOTE — Anesthesia Post-op Follow-up Note (Cosign Needed)
Anesthesia QCDR form completed.        

## 2016-05-08 ENCOUNTER — Emergency Department: Payer: BLUE CROSS/BLUE SHIELD

## 2016-05-08 ENCOUNTER — Encounter: Payer: Self-pay | Admitting: Gastroenterology

## 2016-05-08 ENCOUNTER — Emergency Department
Admission: EM | Admit: 2016-05-08 | Discharge: 2016-05-08 | Disposition: A | Discharge status: Home / Self Care | Payer: BLUE CROSS/BLUE SHIELD | Attending: Emergency Medicine | Admitting: Emergency Medicine

## 2016-05-08 DIAGNOSIS — R1013 Epigastric pain: Secondary | ICD-10-CM | POA: Insufficient documentation

## 2016-05-08 DIAGNOSIS — R112 Nausea with vomiting, unspecified: Secondary | ICD-10-CM | POA: Insufficient documentation

## 2016-05-08 DIAGNOSIS — Z79899 Other long term (current) drug therapy: Secondary | ICD-10-CM | POA: Insufficient documentation

## 2016-05-08 DIAGNOSIS — F909 Attention-deficit hyperactivity disorder, unspecified type: Secondary | ICD-10-CM | POA: Diagnosis not present

## 2016-05-08 DIAGNOSIS — R109 Unspecified abdominal pain: Secondary | ICD-10-CM

## 2016-05-08 LAB — URINALYSIS, COMPLETE (UACMP) WITH MICROSCOPIC
GLUCOSE, UA: NEGATIVE mg/dL
HGB URINE DIPSTICK: NEGATIVE
Ketones, ur: 40 mg/dL — AB
LEUKOCYTES UA: NEGATIVE
NITRITE: NEGATIVE
Protein, ur: 30 mg/dL — AB
pH: 6 (ref 5.0–8.0)

## 2016-05-08 LAB — URINE DRUG SCREEN, QUALITATIVE (ARMC ONLY)
AMPHETAMINES, UR SCREEN: NOT DETECTED
BENZODIAZEPINE, UR SCRN: NOT DETECTED
Barbiturates, Ur Screen: NOT DETECTED
CANNABINOID 50 NG, UR ~~LOC~~: POSITIVE — AB
Cocaine Metabolite,Ur ~~LOC~~: NOT DETECTED
MDMA (ECSTASY) UR SCREEN: NOT DETECTED
Methadone Scn, Ur: NOT DETECTED
Opiate, Ur Screen: NOT DETECTED
Phencyclidine (PCP) Ur S: NOT DETECTED
TRICYCLIC, UR SCREEN: NOT DETECTED

## 2016-05-08 LAB — COMPREHENSIVE METABOLIC PANEL
ALBUMIN: 5.1 g/dL — AB (ref 3.5–5.0)
ALK PHOS: 84 U/L (ref 38–126)
ALT: 15 U/L (ref 14–54)
ANION GAP: 11 (ref 5–15)
AST: 24 U/L (ref 15–41)
BILIRUBIN TOTAL: 0.6 mg/dL (ref 0.3–1.2)
BUN: 11 mg/dL (ref 6–20)
CALCIUM: 9.4 mg/dL (ref 8.9–10.3)
CO2: 27 mmol/L (ref 22–32)
Chloride: 100 mmol/L — ABNORMAL LOW (ref 101–111)
Creatinine, Ser: 0.61 mg/dL (ref 0.44–1.00)
GFR calc Af Amer: >60 mL/min (ref >60)
GFR calc non Af Amer: >60 mL/min (ref >60)
Glucose, Bld: 113 mg/dL — ABNORMAL HIGH (ref 65–99)
POTASSIUM: 3.2 mmol/L — AB (ref 3.5–5.1)
Sodium: 138 mmol/L (ref 135–145)
TOTAL PROTEIN: 8.6 g/dL — AB (ref 6.5–8.1)

## 2016-05-08 LAB — SURGICAL PATHOLOGY

## 2016-05-08 LAB — CBC
HCT: 38.7 % (ref 35.0–47.0)
HEMOGLOBIN: 13.2 g/dL (ref 12.0–16.0)
MCH: 27.3 pg (ref 26.0–34.0)
MCHC: 34.1 g/dL (ref 32.0–36.0)
MCV: 80.2 fL (ref 80.0–100.0)
Platelets: 309 10*3/uL (ref 150–440)
RBC: 4.82 MIL/uL (ref 3.80–5.20)
RDW: 15.1 % — AB (ref 11.5–14.5)
WBC: 12.2 10*3/uL — AB (ref 3.6–11.0)

## 2016-05-08 LAB — LIPASE, BLOOD: Lipase: 19 U/L (ref 11–51)

## 2016-05-08 LAB — POCT PREGNANCY, URINE: PREG TEST UR: NEGATIVE

## 2016-05-08 MED ORDER — METOCLOPRAMIDE HCL 5 MG/ML IJ SOLN
INTRAMUSCULAR | Status: AC
  Administered 2016-05-08: 10 mg via INTRAVENOUS
  Filled 2016-05-08: qty 2

## 2016-05-08 MED ORDER — ACETAMINOPHEN 500 MG PO TABS
1000.0000 mg | ORAL_TABLET | Freq: Once | ORAL | Status: AC
  Administered 2016-05-08: 1000 mg via ORAL

## 2016-05-08 MED ORDER — ACETAMINOPHEN 500 MG PO TABS
ORAL_TABLET | ORAL | Status: AC
  Filled 2016-05-08: qty 2

## 2016-05-08 MED ORDER — DICYCLOMINE HCL 10 MG/ML IM SOLN
20.0000 mg | Freq: Once | INTRAMUSCULAR | Status: AC
  Administered 2016-05-08: 20 mg via INTRAMUSCULAR
  Filled 2016-05-08: qty 2

## 2016-05-08 MED ORDER — METOCLOPRAMIDE HCL 5 MG/ML IJ SOLN
10.0000 mg | Freq: Once | INTRAMUSCULAR | Status: AC
  Administered 2016-05-08: 10 mg via INTRAVENOUS

## 2016-05-08 MED ORDER — ONDANSETRON HCL 4 MG/2ML IJ SOLN
4.0000 mg | Freq: Once | INTRAMUSCULAR | Status: AC | PRN
  Administered 2016-05-08: 4 mg via INTRAVENOUS
  Filled 2016-05-08: qty 2

## 2016-05-08 MED ORDER — DICYCLOMINE HCL 20 MG PO TABS: 20.0000 mg | ORAL_TABLET | Freq: Three times a day (TID) | ORAL | 0 refills | Status: DC | PRN

## 2016-05-08 NOTE — ED Provider Notes (Signed)
[facility] Emergency Department Provider Note   ____________________________________________   I have reviewed the triage vital signs and the nursing notes.   HISTORY  Chief Complaint Abdominal Pain   History limited by: Not Limited   HPI Zoe Hunter is a 21 y.o. female who presents to the emergency department today because of concerns for abdominal pain. It is located in the epigastric region. The patient states that it feels like her typical chronic epigastric abdominal pain. Has been accompanied by nausea. The patient is seen in GI clinic and underwent an EGD 3 days ago. The patient states that she had some pain the day after, then felt better Sunday. Today however the pain has returned. She denies any fevers. Denies trying any medication at home for the pain.   Past Medical History:  Diagnosis Date  . ADHD (attention deficit hyperactivity disorder)   . Bipolar disorder (HCC)   . Cholelithiasis 01/2015  . GERD (gastroesophageal reflux disease)     Patient Active Problem List   Diagnosis Date Noted  . ADD (attention deficit disorder) 02/19/2015  . Bipolar affective disorder (HCC) 02/19/2015  . Clinical depression 02/19/2015  . Cholelithiasis 01/28/2015    Past Surgical History:  Procedure Laterality Date  . CHOLECYSTECTOMY N/A 02/16/2015   Procedure: LAPAROSCOPIC CHOLECYSTECTOMY;  Surgeon: Natale Lay, MD;  Location: [facility] ORS;  Service: General;  Laterality: N/A;  . ESOPHAGOGASTRODUODENOSCOPY (EGD) WITH PROPOFOL N/A 05/05/2016   Procedure: ESOPHAGOGASTRODUODENOSCOPY (EGD) WITH PROPOFOL;  Surgeon: Christena Deem, MD;  [facility] ENDOSCOPY;  Service: Endoscopy;  Laterality: N/A;  . TONSILLECTOMY    . WISDOM TOOTH EXTRACTION      Prior to Admission medications   Medication Sig Start Date End Date Taking? Authorizing Provider  amphetamine-dextroamphetamine (ADDERALL) 30 MG tablet Take 30 mg by mouth daily.    Historical Provider, MD   atomoxetine (STRATTERA) 40 MG capsule Take 40 mg by mouth daily.    Historical Provider, MD  dicyclomine (BENTYL) 20 MG tablet Take 1 tablet by mouth 3 (three) times daily. 02/07/15   Historical Provider, MD  famotidine (PEPCID) 20 MG tablet Take 1 tablet (20 mg total) by mouth 2 (two) times daily. 12/10/15   Nayelly Filbert, MD  lamoTRIgine (LAMICTAL) 150 MG tablet Take 150 mg by mouth daily.    Historical Provider, MD  loperamide (IMODIUM A-D) 2 MG tablet Take 1 tablet (2 mg total) by mouth 4 (four) times daily as needed for diarrhea or loose stools. 05/05/15   Anne-Caroline Sharma Covert, MD  LORazepam (ATIVAN) 1 MG tablet Take 1 tablet (1 mg total) by mouth 2 (two) times daily. 12/10/15 12/09/16  Cayci Filbert, MD  ondansetron (ZOFRAN ODT) 4 MG disintegrating tablet Take 1 tablet (4 mg total) by mouth every 8 (eight) hours as needed for nausea or vomiting. 05/05/15   Anne-Caroline Sharma Covert, MD  ondansetron (ZOFRAN ODT) 4 MG disintegrating tablet Take 1 tablet (4 mg total) by mouth every 8 (eight) hours as needed for nausea or vomiting. 12/10/15   Adanna Filbert, MD    Allergies Patient has no known allergies.  Family History  Problem Relation Age of Onset  . Hypertension Mother   . Hypertension Maternal Grandmother   . Cancer Other     Social History Social History  Substance Use Topics  . Smoking status: Never Smoker  . Smokeless tobacco: Never Used     Comment: boyfriend smokes  . Alcohol use No    Review of Systems  Constitutional:  Negative for fever. Cardiovascular: Negative for chest pain. Respiratory: Negative for shortness of breath. Gastrointestinal: Positive for abdominal pain. Positive for vomiting. Neurological: Negative for headaches, focal weakness or numbness.  10-point ROS otherwise negative.  ____________________________________________   PHYSICAL EXAM:  VITAL SIGNS: ED Triage Vitals  Enc Vitals Group     BP 05/08/16 1203 (!) 147/96     Pulse  Rate 05/08/16 1203 (!) 52     Resp 05/08/16 1203 18     Temp 05/08/16 1203 97.9 F (36.6 C)     Temp Source 05/08/16 1203 Oral     SpO2 05/08/16 1203 100 %     Weight 05/08/16 1204 196 lb (88.9 kg)     Height 05/08/16 1204 5\' 3"  (1.6 m)     Head Circumference --      Peak Flow --      Pain Score 05/08/16 1205 10     Pain Loc --      Pain Edu? --      Excl. in GC? --      Constitutional: Alert and oriented. Well appearing and in no distress. Eyes: Conjunctivae are normal. Normal extraocular movements. ENT   Head: Normocephalic and atraumatic.   Nose: No congestion/rhinnorhea.   Mouth/Throat: Mucous membranes are moist.   Neck: No stridor. Hematological/Lymphatic/Immunilogical: No cervical lymphadenopathy. Cardiovascular: Normal rate, regular rhythm.  No murmurs, rubs, or gallops.  Respiratory: Normal respiratory effort without tachypnea nor retractions. Breath sounds are clear and equal bilaterally. No wheezes/rales/rhonchi. Gastrointestinal: Soft and non tender. No rebound. No guarding.  Genitourinary: Deferred Musculoskeletal: Normal range of motion in all extremities. No lower extremity edema. Neurologic:  Normal speech and language. No gross focal neurologic deficits are appreciated.  Skin:  Skin is warm, dry and intact. No rash noted. Psychiatric: Mood and affect are normal. Speech and behavior are normal. Patient exhibits appropriate insight and judgment.  ____________________________________________    LABS (pertinent positives/negatives)  Labs Reviewed  COMPREHENSIVE METABOLIC PANEL - Abnormal; Notable for the following:       Result Value   Potassium 3.2 (*)    Chloride 100 (*)    Glucose, Bld 113 (*)    Total Protein 8.6 (*)    Albumin 5.1 (*)    All other components within normal limits  CBC - Abnormal; Notable for the following:    WBC 12.2 (*)    RDW 15.1 (*)    All other components within normal limits  URINALYSIS, COMPLETE (UACMP) WITH  MICROSCOPIC - Abnormal; Notable for the following:    APPearance HAZY (*)    Specific Gravity, Urine >1.030 (*)    Bilirubin Urine SMALL (*)    Ketones, ur 40 (*)    Protein, ur 30 (*)    Squamous Epithelial / LPF 6-30 (*)    Bacteria, UA FEW (*)    All other components within normal limits  URINE DRUG SCREEN, QUALITATIVE ([facility] ONLY) - Abnormal; Notable for the following:    Cannabinoid 50 Ng, Ur [place] POSITIVE (*)    All other components within normal limits  LIPASE, BLOOD  POC URINE PREG, ED  POCT PREGNANCY, URINE     ____________________________________________   EKG  None  ____________________________________________    RADIOLOGY  DG acute abd IMPRESSION:  1. Normal chest radiograph.  2. No Acute abdominal findings.    ___________________________________________   PROCEDURES  Procedures  ____________________________________________   INITIAL IMPRESSION / ASSESSMENT AND PLAN / ED COURSE  Pertinent labs & imaging results that  were available during my care of the patient were reviewed by me and considered in my medical decision making (see chart for details).  Patient presented to the emergency department today with abdominal pain. Patient has a history of chronic abdominal pain and is seen by GI. Underwent an EGD 3 days ago. X-rays were obtained which did not show any free air under the diaphragm. At this point very low suspicion for any perforation or other acute complication of EGD. Patient states the pain is similar to her previous pain. Patient was given Bentyl and did have good resolution of the patient's pain. Will discharge with same. Will have patient follow up with GI doctor.  ____________________________________________   FINAL CLINICAL IMPRESSION(S) / ED DIAGNOSES  Final diagnoses:  Abdominal pain, unspecified abdominal location     Note: This dictation was prepared with Dragon dictation. Any transcriptional errors that result from this process  are unintentional     Phineas SemenGraydon Aubrii Sharpless, MD 05/08/16 1641

## 2016-05-08 NOTE — ED Triage Notes (Signed)
Nausea, vomiting and abdominal pain x today.

## 2016-05-08 NOTE — ED Notes (Signed)
First nurse note   States she is having "severe" abd pain with positive n/v

## 2016-05-08 NOTE — Discharge Instructions (Signed)
Please seek medical attention for any high fevers, chest pain, shortness of breath, change in behavior, persistent vomiting, bloody stool or any other new or concerning symptoms.  

## 2016-05-10 LAB — URINE CULTURE

## 2016-05-16 NOTE — Anesthesia Postprocedure Evaluation (Signed)
Anesthesia Post Note  Patient: Zoe Hunter  Procedure(s) Performed: Procedure(s) (LRB): ESOPHAGOGASTRODUODENOSCOPY (EGD) WITH PROPOFOL (N/A)  Patient location during evaluation: PACU Anesthesia Type: General Level of consciousness: awake and alert Pain management: pain level controlled Vital Signs Assessment: post-procedure vital signs reviewed and stable Respiratory status: spontaneous breathing, nonlabored ventilation, respiratory function stable and patient connected to nasal cannula oxygen Cardiovascular status: blood pressure returned to baseline and stable Postop Assessment: no signs of nausea or vomiting Anesthetic complications: no     Last Vitals:  Vitals:   05/05/16 1411 05/05/16 1421  BP: 97/69 112/90  Pulse: 76 71  Resp: 16 16  Temp:      Last Pain:  Vitals:   05/08/16 0717  TempSrc:   PainSc: 0-No pain                 Yevette EdwardsJames G Charlaine Utsey

## 2017-05-24 DIAGNOSIS — R87612 Low grade squamous intraepithelial lesion on cytologic smear of cervix (LGSIL): Secondary | ICD-10-CM | POA: Insufficient documentation

## 2018-06-06 ENCOUNTER — Other Ambulatory Visit: Payer: Self-pay

## 2018-06-06 ENCOUNTER — Ambulatory Visit: Payer: BLUE CROSS/BLUE SHIELD

## 2018-06-06 ENCOUNTER — Ambulatory Visit
Admission: EM | Admit: 2018-06-06 | Discharge: 2018-06-06 | Disposition: A | Discharge status: Home / Self Care | Payer: BLUE CROSS/BLUE SHIELD | Attending: Family Medicine | Admitting: Family Medicine

## 2018-06-06 ENCOUNTER — Encounter: Payer: Self-pay | Admitting: Emergency Medicine

## 2018-06-06 DIAGNOSIS — M542 Cervicalgia: Secondary | ICD-10-CM

## 2018-06-06 DIAGNOSIS — R229 Localized swelling, mass and lump, unspecified: Secondary | ICD-10-CM

## 2018-06-06 DIAGNOSIS — R6884 Jaw pain: Secondary | ICD-10-CM

## 2018-06-06 NOTE — Discharge Instructions (Signed)
See Dr. Arville Care tomorrow for further evaluation.

## 2018-06-06 NOTE — ED Provider Notes (Signed)
[address] [place], [place] [address] [phone]  Name: Zoe Hunter DOB: 1995-03-01 MRN: [id number] CSN: [id number]  Arrival date and time:  06/06/18 1435  Chief Complaint:  Mass  NOTE: Prior to seeing the patient today, I have reviewed the triage nursing documentation and vital signs. Clinical staff has updated patient's PMH/PSHx, current medication list, and drug allergies/intolerances to ensure comprehensive history available to assist in medical decision making.   History:   HPI: Zoe Hunter is a 23 y.o. female who presents today with complaints of pain under her chin that began with acute onset on 06/03/2018. Patient notes that she has experienced intermittent facial numbness associated with pain. Patient states, "when my face went numb I looked in the mirror and noticed that I had a big lump".   Patient advising that she has had areas of concern on plain dental radiographs in the past. Her primary dental provider referred her to [facility] (Dr. Arville Care) for further evaluation. Patient states, "he told me that if I wasn't having symptoms, then he was just going to watch it". Patient denies fevers and recent illness. No dental concerns. No recent injuries.   Past Medical History:  Diagnosis Date   ADHD (attention deficit hyperactivity disorder)    Bipolar disorder (HCC)    Cholelithiasis 01/2015   GERD (gastroesophageal reflux disease)     Past Surgical History:  Procedure Laterality Date   CHOLECYSTECTOMY N/A 02/16/2015   Procedure: LAPAROSCOPIC CHOLECYSTECTOMY;  Surgeon: Natale Lay, MD;  Location: [facility] ORS;  Service: General;  Laterality: N/A;   ESOPHAGOGASTRODUODENOSCOPY (EGD) WITH PROPOFOL N/A 05/05/2016   Procedure: ESOPHAGOGASTRODUODENOSCOPY (EGD) WITH PROPOFOL;  Surgeon: Christena Deem, MD;  Location: [facility] ENDOSCOPY;  Service: Endoscopy;  Laterality: N/A;   TONSILLECTOMY     WISDOM TOOTH EXTRACTION      Family History  Problem Relation  Age of Onset   Hypertension Mother    Hypertension Maternal Grandmother    Cancer Other     Social History   Socioeconomic History   Marital status: Single    Spouse name: Not on file   Number of children: Not on file   Years of education: Not on file   Highest education level: Not on file  Occupational History   Not on file  Social Needs   Financial resource strain: Not on file   Food insecurity:    Worry: Not on file    Inability: Not on file   Transportation needs:    Medical: Not on file    Non-medical: Not on file  Tobacco Use   Smoking status: Never Smoker   Smokeless tobacco: Never Used   Tobacco comment: boyfriend smokes  Substance and Sexual Activity   Alcohol use: No   Drug use: Yes    Types: Marijuana   Sexual activity: Not on file  Lifestyle   Physical activity:    Days per week: Not on file    Minutes per session: Not on file   Stress: Not on file  Relationships   Social connections:    Talks on phone: Not on file    Gets together: Not on file    Attends religious service: Not on file    Active member of club or organization: Not on file    Attends meetings of clubs or organizations: Not on file    Relationship status: Not on file   Intimate partner violence:    Fear of current or ex partner: Not on file  Emotionally abused: Not on file    Physically abused: Not on file    Forced sexual activity: Not on file  Other Topics Concern   Not on file  Social History Narrative   Not on file    Patient Active Problem List   Diagnosis Date Noted   ADD (attention deficit disorder) 02/19/2015   Bipolar affective disorder (HCC) 02/19/2015   Clinical depression 02/19/2015   Cholelithiasis 01/28/2015    Home Medications:    Current Meds  Medication Sig   amphetamine-dextroamphetamine (ADDERALL) 30 MG tablet Take 30 mg by mouth daily.   dicyclomine (BENTYL) 20 MG tablet Take 1 tablet by mouth 3 (three) times daily.    famotidine (PEPCID) 20 MG tablet Take 1 tablet (20 mg total) by mouth 2 (two) times daily.   ondansetron (ZOFRAN ODT) 4 MG disintegrating tablet Take 1 tablet (4 mg total) by mouth every 8 (eight) hours as needed for nausea or vomiting.    Allergies:   Patient has no known allergies.  Review of Systems (ROS): Review of Systems  Constitutional: Negative for chills and fever.  HENT:       Submental point tenderness  Respiratory: Negative for cough and shortness of breath.   Cardiovascular: Negative for chest pain and palpitations.  Skin: Negative for color change and rash.  Neurological: Positive for numbness (intermittent facial (bilateral jaw) numbness). Negative for dizziness, seizures, syncope, facial asymmetry, weakness and headaches.  Hematological: Negative for adenopathy.  Psychiatric/Behavioral: Negative.   All other systems reviewed and are negative.    Physical Exam:  Triage Vital Signs ED Triage Vitals  Enc Vitals Group     BP 06/06/18 1454 (!) 107/52     Pulse Rate 06/06/18 1454 (!) 58     Resp 06/06/18 1454 18     Temp 06/06/18 1454 98.8 F (37.1 C)     Temp Source 06/06/18 1454 Oral     SpO2 06/06/18 1454 99 %     Weight 06/06/18 1450 215 lb (97.5 kg)     Height 06/06/18 1450 5' 2.5" (1.588 m)     Head Circumference --      Peak Flow --      Pain Score 06/06/18 1449 3     Pain Loc --      Pain Edu? --      Excl. in GC? --     Physical Exam  Constitutional: She is oriented to person, place, and time and well-developed, well-nourished, and in no distress.  HENT:  Head: Normocephalic and atraumatic.  Mouth/Throat: Oropharynx is clear and moist and mucous membranes are normal.  Eyes: Pupils are equal, round, and reactive to light. EOM are normal.  Neck: Normal range of motion. Neck supple. No tracheal deviation present.    (+) submental (mid-line submandibular) area of tenderness.  Cardiovascular: Regular rhythm, normal heart sounds and intact distal  pulses. Bradycardia present. Exam reveals no gallop and no friction rub.  No murmur heard. Pulmonary/Chest: Effort normal and breath sounds normal. No respiratory distress. She has no wheezes. She has no rales.  Neurological: She is alert and oriented to person, place, and time. She has normal motor skills, normal sensation, normal strength, normal reflexes and intact cranial nerves. Gait normal.  Skin: Skin is warm and dry. No rash noted. No erythema.  Psychiatric: Mood, affect and judgment normal.  Nursing note and vitals reviewed.    Urgent Care Treatments / Results:   LABS: PLEASE NOTE: all labs ordered are listed,  but only abnormal results are displayed. Labs Reviewed - No data to display  PRODEDURES: Procedures  MEDICATIONS RECEIVED THIS VISIT: Medications - No data to display     Initial Impression / Assessment and Plan / Urgent Care Course:   Pertinent labs & imaging results that were available during my care of the patient were personally reviewed by me and considered in my medical decision making (see chart for details).   Zoe Hunter is a 23 y.o. female who [facility] with complaints of neck pain that began with acute onset on 06/03/2018. Patient denies recent illness; no fevers. No recent trauma. No known dental issues. Patient advising that she has been seen in the past by Dr. Arville CareParks (oral surgeon) for areas of concern seen on routine dental radiographs. She states, "he told me if there were no symptoms then he was going to watch me". Patient advising that she was told by her dental provider that areas were potential concern for malignancy.   Discussed work up here vs. ED referral. Patient adamant that she did not want to go to the ED because of associated costs. Spoke with attending provider on staff Adriana Simas(Cook, MD) who felt it was reasonable to pursue labs and imaging here as presented in my plan of care. Discussed plan with patient. She notes that  she does not have insurance and had to call family. While on phone with family, I placed a call to [facility] today. Spoke with primary office staff for Dr. Arville CareParks; DDS not in the office today. Patient advising that she had been calling dentist today, however she had not received a return call. Presenting symptoms communicated to dental practice. Per dental office, the areas of concern that patient is referencing have been being followed since 2011. During her last visit to their practice, she was asymptomatic, had no pain, and agreed to annual monitoring. Patient was offered a 12:30 pm appointment on 06/07/2018 to be seen by Dr. Arville CareParks. Discussed plans with patient. Patient electing to hold off on CT and labs here as she is concerned about the cost. She will see oral surgery tomorrow as scheduled.   RTW noted provided for patient to return without restrictions on 06/07/2018.  I have reviewed the follow up and strict return precautions for any new or worsening symptoms. Patient is aware of symptoms that would be deemed urgent/emergent, and would thus require further evaluation either here or in the emergency department. At the time of discharge, she verbalized understanding and consent with the discharge plan as it was reviewed with her. All questions were fielded by provider and/or clinic staff prior to patient discharge.    Final Clinical Impressions(s) / Urgent Care Diagnoses:   Final diagnoses:  Pain, neck    New Prescriptions:  No orders of the defined types were placed in this encounter.   Controlled Substance Prescriptions:  [place] Controlled Substance Registry consulted? Not Applicable  Note: This note was prepared using Dragon dictation software along with smaller phrase technology. Despite my best ability to proofread, there is the potential that transcriptional errors may still occur from this process, and are completely unintentional.     Verlee MonteGray, Juquan Reznick E, NP 06/06/18 1555

## 2018-06-06 NOTE — ED Triage Notes (Signed)
Pt c/o lump under her chin. She states that she noticed it 4 days ago. She felt like her face went numb and when she looked in the mirror she noticed the lump. She says it is painful.

## 2018-08-29 DIAGNOSIS — O219 Vomiting of pregnancy, unspecified: Secondary | ICD-10-CM | POA: Insufficient documentation

## 2018-08-29 DIAGNOSIS — K2960 Other gastritis without bleeding: Secondary | ICD-10-CM | POA: Insufficient documentation

## 2018-09-10 DIAGNOSIS — R8271 Bacteriuria: Secondary | ICD-10-CM | POA: Insufficient documentation

## 2018-09-27 DIAGNOSIS — L590 Erythema ab igne [dermatitis ab igne]: Secondary | ICD-10-CM | POA: Insufficient documentation

## 2018-10-02 LAB — HM PAP SMEAR: HM Pap smear: NORMAL

## 2018-10-02 LAB — RESULTS CONSOLE HPV: CHL HPV: NEGATIVE

## 2018-11-11 DIAGNOSIS — F4321 Adjustment disorder with depressed mood: Secondary | ICD-10-CM | POA: Insufficient documentation

## 2018-11-14 DIAGNOSIS — F1290 Cannabis use, unspecified, uncomplicated: Secondary | ICD-10-CM | POA: Insufficient documentation

## 2018-11-14 DIAGNOSIS — F419 Anxiety disorder, unspecified: Secondary | ICD-10-CM | POA: Insufficient documentation

## 2018-11-14 DIAGNOSIS — R45851 Suicidal ideations: Secondary | ICD-10-CM | POA: Insufficient documentation

## 2018-11-14 HISTORY — DX: Suicidal ideations: R45.851

## 2019-01-30 DIAGNOSIS — R1032 Left lower quadrant pain: Secondary | ICD-10-CM | POA: Diagnosis not present

## 2019-01-30 DIAGNOSIS — R112 Nausea with vomiting, unspecified: Secondary | ICD-10-CM | POA: Diagnosis not present

## 2019-01-30 DIAGNOSIS — O99283 Endocrine, nutritional and metabolic diseases complicating pregnancy, third trimester: Secondary | ICD-10-CM | POA: Diagnosis not present

## 2019-01-30 DIAGNOSIS — Z3A28 28 weeks gestation of pregnancy: Secondary | ICD-10-CM | POA: Diagnosis not present

## 2019-01-30 DIAGNOSIS — O99113 Other diseases of the blood and blood-forming organs and certain disorders involving the immune mechanism complicating pregnancy, third trimester: Secondary | ICD-10-CM | POA: Diagnosis not present

## 2019-01-30 DIAGNOSIS — O99323 Drug use complicating pregnancy, third trimester: Secondary | ICD-10-CM | POA: Diagnosis not present

## 2019-01-30 DIAGNOSIS — F319 Bipolar disorder, unspecified: Secondary | ICD-10-CM | POA: Diagnosis not present

## 2019-01-30 DIAGNOSIS — O211 Hyperemesis gravidarum with metabolic disturbance: Secondary | ICD-10-CM | POA: Diagnosis not present

## 2019-01-30 DIAGNOSIS — F419 Anxiety disorder, unspecified: Secondary | ICD-10-CM | POA: Diagnosis not present

## 2019-01-30 DIAGNOSIS — Z20828 Contact with and (suspected) exposure to other viral communicable diseases: Secondary | ICD-10-CM | POA: Diagnosis not present

## 2019-01-30 DIAGNOSIS — O99343 Other mental disorders complicating pregnancy, third trimester: Secondary | ICD-10-CM | POA: Diagnosis not present

## 2019-01-30 DIAGNOSIS — O219 Vomiting of pregnancy, unspecified: Secondary | ICD-10-CM | POA: Diagnosis not present

## 2019-01-30 DIAGNOSIS — D72829 Elevated white blood cell count, unspecified: Secondary | ICD-10-CM | POA: Diagnosis not present

## 2019-01-30 DIAGNOSIS — N86 Erosion and ectropion of cervix uteri: Secondary | ICD-10-CM | POA: Diagnosis not present

## 2019-01-30 DIAGNOSIS — O3443 Maternal care for other abnormalities of cervix, third trimester: Secondary | ICD-10-CM | POA: Diagnosis not present

## 2019-01-30 DIAGNOSIS — E876 Hypokalemia: Secondary | ICD-10-CM | POA: Diagnosis not present

## 2019-01-30 DIAGNOSIS — F329 Major depressive disorder, single episode, unspecified: Secondary | ICD-10-CM | POA: Diagnosis not present

## 2019-02-01 DIAGNOSIS — O211 Hyperemesis gravidarum with metabolic disturbance: Secondary | ICD-10-CM | POA: Diagnosis not present

## 2019-02-01 DIAGNOSIS — Z3A29 29 weeks gestation of pregnancy: Secondary | ICD-10-CM | POA: Diagnosis not present

## 2019-02-02 DIAGNOSIS — Z3A29 29 weeks gestation of pregnancy: Secondary | ICD-10-CM | POA: Diagnosis not present

## 2019-02-02 DIAGNOSIS — O219 Vomiting of pregnancy, unspecified: Secondary | ICD-10-CM | POA: Diagnosis not present

## 2019-02-02 DIAGNOSIS — O211 Hyperemesis gravidarum with metabolic disturbance: Secondary | ICD-10-CM | POA: Diagnosis not present

## 2019-02-03 DIAGNOSIS — Z3A29 29 weeks gestation of pregnancy: Secondary | ICD-10-CM | POA: Diagnosis not present

## 2019-02-03 DIAGNOSIS — O211 Hyperemesis gravidarum with metabolic disturbance: Secondary | ICD-10-CM | POA: Diagnosis not present

## 2019-02-06 DIAGNOSIS — O99891 Other specified diseases and conditions complicating pregnancy: Secondary | ICD-10-CM | POA: Diagnosis not present

## 2019-02-06 DIAGNOSIS — E441 Mild protein-calorie malnutrition: Secondary | ICD-10-CM | POA: Diagnosis not present

## 2019-02-06 DIAGNOSIS — Z315 Encounter for genetic counseling: Secondary | ICD-10-CM | POA: Diagnosis not present

## 2019-02-06 DIAGNOSIS — F4321 Adjustment disorder with depressed mood: Secondary | ICD-10-CM | POA: Diagnosis not present

## 2019-02-06 DIAGNOSIS — O211 Hyperemesis gravidarum with metabolic disturbance: Secondary | ICD-10-CM | POA: Diagnosis not present

## 2019-02-06 DIAGNOSIS — K30 Functional dyspepsia: Secondary | ICD-10-CM | POA: Diagnosis not present

## 2019-02-06 DIAGNOSIS — D509 Iron deficiency anemia, unspecified: Secondary | ICD-10-CM | POA: Diagnosis not present

## 2019-02-06 DIAGNOSIS — O99283 Endocrine, nutritional and metabolic diseases complicating pregnancy, third trimester: Secondary | ICD-10-CM | POA: Diagnosis not present

## 2019-02-06 DIAGNOSIS — O99820 Streptococcus B carrier state complicating pregnancy: Secondary | ICD-10-CM | POA: Diagnosis not present

## 2019-02-06 DIAGNOSIS — O219 Vomiting of pregnancy, unspecified: Secondary | ICD-10-CM | POA: Diagnosis not present

## 2019-02-06 DIAGNOSIS — Z20828 Contact with and (suspected) exposure to other viral communicable diseases: Secondary | ICD-10-CM | POA: Diagnosis not present

## 2019-02-06 DIAGNOSIS — O99013 Anemia complicating pregnancy, third trimester: Secondary | ICD-10-CM | POA: Diagnosis not present

## 2019-02-06 DIAGNOSIS — Z9049 Acquired absence of other specified parts of digestive tract: Secondary | ICD-10-CM | POA: Diagnosis not present

## 2019-02-06 DIAGNOSIS — M791 Myalgia, unspecified site: Secondary | ICD-10-CM | POA: Diagnosis not present

## 2019-02-06 DIAGNOSIS — Z3A29 29 weeks gestation of pregnancy: Secondary | ICD-10-CM | POA: Diagnosis not present

## 2019-02-06 DIAGNOSIS — O99213 Obesity complicating pregnancy, third trimester: Secondary | ICD-10-CM | POA: Diagnosis not present

## 2019-02-06 DIAGNOSIS — O99343 Other mental disorders complicating pregnancy, third trimester: Secondary | ICD-10-CM | POA: Diagnosis not present

## 2019-02-06 DIAGNOSIS — E669 Obesity, unspecified: Secondary | ICD-10-CM | POA: Diagnosis not present

## 2019-02-06 DIAGNOSIS — O0991 Supervision of high risk pregnancy, unspecified, first trimester: Secondary | ICD-10-CM | POA: Diagnosis not present

## 2019-02-06 DIAGNOSIS — R112 Nausea with vomiting, unspecified: Secondary | ICD-10-CM | POA: Diagnosis not present

## 2019-02-06 DIAGNOSIS — O99323 Drug use complicating pregnancy, third trimester: Secondary | ICD-10-CM | POA: Diagnosis not present

## 2019-02-06 DIAGNOSIS — O99613 Diseases of the digestive system complicating pregnancy, third trimester: Secondary | ICD-10-CM | POA: Diagnosis not present

## 2019-02-06 DIAGNOSIS — F319 Bipolar disorder, unspecified: Secondary | ICD-10-CM | POA: Diagnosis not present

## 2019-02-07 DIAGNOSIS — Z3A29 29 weeks gestation of pregnancy: Secondary | ICD-10-CM | POA: Diagnosis not present

## 2019-02-07 DIAGNOSIS — O211 Hyperemesis gravidarum with metabolic disturbance: Secondary | ICD-10-CM | POA: Diagnosis not present

## 2019-02-08 DIAGNOSIS — Z3A29 29 weeks gestation of pregnancy: Secondary | ICD-10-CM | POA: Diagnosis not present

## 2019-02-08 DIAGNOSIS — O211 Hyperemesis gravidarum with metabolic disturbance: Secondary | ICD-10-CM | POA: Diagnosis not present

## 2019-02-09 DIAGNOSIS — O211 Hyperemesis gravidarum with metabolic disturbance: Secondary | ICD-10-CM | POA: Diagnosis not present

## 2019-02-09 DIAGNOSIS — Z3A29 29 weeks gestation of pregnancy: Secondary | ICD-10-CM | POA: Diagnosis not present

## 2019-02-10 DIAGNOSIS — F418 Other specified anxiety disorders: Secondary | ICD-10-CM | POA: Diagnosis not present

## 2019-02-10 DIAGNOSIS — Z3A29 29 weeks gestation of pregnancy: Secondary | ICD-10-CM | POA: Diagnosis not present

## 2019-02-10 DIAGNOSIS — O212 Late vomiting of pregnancy: Secondary | ICD-10-CM | POA: Diagnosis not present

## 2019-02-10 DIAGNOSIS — Z8659 Personal history of other mental and behavioral disorders: Secondary | ICD-10-CM | POA: Diagnosis not present

## 2019-02-10 DIAGNOSIS — G479 Sleep disorder, unspecified: Secondary | ICD-10-CM | POA: Diagnosis not present

## 2019-02-10 DIAGNOSIS — F319 Bipolar disorder, unspecified: Secondary | ICD-10-CM | POA: Diagnosis not present

## 2019-02-10 DIAGNOSIS — I491 Atrial premature depolarization: Secondary | ICD-10-CM | POA: Diagnosis not present

## 2019-02-10 DIAGNOSIS — Z3A30 30 weeks gestation of pregnancy: Secondary | ICD-10-CM | POA: Diagnosis not present

## 2019-02-10 DIAGNOSIS — O211 Hyperemesis gravidarum with metabolic disturbance: Secondary | ICD-10-CM | POA: Diagnosis not present

## 2019-02-10 DIAGNOSIS — O99213 Obesity complicating pregnancy, third trimester: Secondary | ICD-10-CM | POA: Diagnosis not present

## 2019-02-10 DIAGNOSIS — E669 Obesity, unspecified: Secondary | ICD-10-CM | POA: Diagnosis not present

## 2019-02-11 DIAGNOSIS — R1012 Left upper quadrant pain: Secondary | ICD-10-CM | POA: Diagnosis not present

## 2019-02-11 DIAGNOSIS — R112 Nausea with vomiting, unspecified: Secondary | ICD-10-CM | POA: Diagnosis not present

## 2019-02-11 DIAGNOSIS — D509 Iron deficiency anemia, unspecified: Secondary | ICD-10-CM | POA: Diagnosis not present

## 2019-02-11 DIAGNOSIS — Z3A29 29 weeks gestation of pregnancy: Secondary | ICD-10-CM | POA: Diagnosis not present

## 2019-02-11 DIAGNOSIS — R1013 Epigastric pain: Secondary | ICD-10-CM | POA: Diagnosis not present

## 2019-02-11 DIAGNOSIS — O211 Hyperemesis gravidarum with metabolic disturbance: Secondary | ICD-10-CM | POA: Diagnosis not present

## 2019-02-12 DIAGNOSIS — Z3A29 29 weeks gestation of pregnancy: Secondary | ICD-10-CM | POA: Diagnosis not present

## 2019-02-12 DIAGNOSIS — N1330 Unspecified hydronephrosis: Secondary | ICD-10-CM | POA: Diagnosis not present

## 2019-02-12 DIAGNOSIS — O211 Hyperemesis gravidarum with metabolic disturbance: Secondary | ICD-10-CM | POA: Diagnosis not present

## 2019-02-28 DIAGNOSIS — O1413 Severe pre-eclampsia, third trimester: Secondary | ICD-10-CM

## 2019-03-04 DIAGNOSIS — R1012 Left upper quadrant pain: Secondary | ICD-10-CM | POA: Diagnosis not present

## 2019-03-04 DIAGNOSIS — O26893 Other specified pregnancy related conditions, third trimester: Secondary | ICD-10-CM | POA: Diagnosis not present

## 2019-03-04 DIAGNOSIS — Z3A33 33 weeks gestation of pregnancy: Secondary | ICD-10-CM | POA: Diagnosis not present

## 2019-03-05 DIAGNOSIS — O99891 Other specified diseases and conditions complicating pregnancy: Secondary | ICD-10-CM | POA: Diagnosis not present

## 2019-03-05 DIAGNOSIS — Z818 Family history of other mental and behavioral disorders: Secondary | ICD-10-CM | POA: Diagnosis not present

## 2019-03-05 DIAGNOSIS — F329 Major depressive disorder, single episode, unspecified: Secondary | ICD-10-CM | POA: Diagnosis not present

## 2019-03-05 DIAGNOSIS — O99333 Smoking (tobacco) complicating pregnancy, third trimester: Secondary | ICD-10-CM | POA: Diagnosis not present

## 2019-03-05 DIAGNOSIS — R109 Unspecified abdominal pain: Secondary | ICD-10-CM | POA: Diagnosis not present

## 2019-03-05 DIAGNOSIS — O26893 Other specified pregnancy related conditions, third trimester: Secondary | ICD-10-CM | POA: Diagnosis not present

## 2019-03-05 DIAGNOSIS — Z3A33 33 weeks gestation of pregnancy: Secondary | ICD-10-CM | POA: Diagnosis not present

## 2019-03-07 DIAGNOSIS — O211 Hyperemesis gravidarum with metabolic disturbance: Secondary | ICD-10-CM | POA: Diagnosis not present

## 2019-03-07 DIAGNOSIS — O219 Vomiting of pregnancy, unspecified: Secondary | ICD-10-CM | POA: Diagnosis not present

## 2019-03-07 DIAGNOSIS — E860 Dehydration: Secondary | ICD-10-CM | POA: Diagnosis not present

## 2019-03-07 DIAGNOSIS — O99323 Drug use complicating pregnancy, third trimester: Secondary | ICD-10-CM | POA: Diagnosis not present

## 2019-03-07 DIAGNOSIS — O99343 Other mental disorders complicating pregnancy, third trimester: Secondary | ICD-10-CM | POA: Diagnosis not present

## 2019-03-07 DIAGNOSIS — R1012 Left upper quadrant pain: Secondary | ICD-10-CM | POA: Diagnosis not present

## 2019-03-07 DIAGNOSIS — O99213 Obesity complicating pregnancy, third trimester: Secondary | ICD-10-CM | POA: Diagnosis not present

## 2019-03-07 DIAGNOSIS — K219 Gastro-esophageal reflux disease without esophagitis: Secondary | ICD-10-CM | POA: Diagnosis not present

## 2019-03-07 DIAGNOSIS — Z9049 Acquired absence of other specified parts of digestive tract: Secondary | ICD-10-CM | POA: Diagnosis not present

## 2019-03-07 DIAGNOSIS — Z597 Insufficient social insurance and welfare support: Secondary | ICD-10-CM | POA: Diagnosis not present

## 2019-03-07 DIAGNOSIS — Z3A34 34 weeks gestation of pregnancy: Secondary | ICD-10-CM | POA: Diagnosis not present

## 2019-03-07 DIAGNOSIS — O212 Late vomiting of pregnancy: Secondary | ICD-10-CM | POA: Diagnosis not present

## 2019-03-07 DIAGNOSIS — O26893 Other specified pregnancy related conditions, third trimester: Secondary | ICD-10-CM | POA: Diagnosis not present

## 2019-03-07 DIAGNOSIS — Z20822 Contact with and (suspected) exposure to covid-19: Secondary | ICD-10-CM | POA: Diagnosis not present

## 2019-03-07 DIAGNOSIS — F4321 Adjustment disorder with depressed mood: Secondary | ICD-10-CM | POA: Diagnosis not present

## 2019-03-07 DIAGNOSIS — F319 Bipolar disorder, unspecified: Secondary | ICD-10-CM | POA: Diagnosis not present

## 2019-03-07 DIAGNOSIS — E669 Obesity, unspecified: Secondary | ICD-10-CM | POA: Diagnosis not present

## 2019-03-07 DIAGNOSIS — O99283 Endocrine, nutritional and metabolic diseases complicating pregnancy, third trimester: Secondary | ICD-10-CM | POA: Diagnosis not present

## 2019-03-07 DIAGNOSIS — O99613 Diseases of the digestive system complicating pregnancy, third trimester: Secondary | ICD-10-CM | POA: Diagnosis not present

## 2019-03-08 DIAGNOSIS — R1012 Left upper quadrant pain: Secondary | ICD-10-CM | POA: Diagnosis not present

## 2019-03-08 DIAGNOSIS — O26893 Other specified pregnancy related conditions, third trimester: Secondary | ICD-10-CM | POA: Diagnosis not present

## 2019-03-08 DIAGNOSIS — F319 Bipolar disorder, unspecified: Secondary | ICD-10-CM | POA: Diagnosis not present

## 2019-03-08 DIAGNOSIS — O99343 Other mental disorders complicating pregnancy, third trimester: Secondary | ICD-10-CM | POA: Diagnosis not present

## 2019-03-08 DIAGNOSIS — O99820 Streptococcus B carrier state complicating pregnancy: Secondary | ICD-10-CM | POA: Diagnosis not present

## 2019-03-08 DIAGNOSIS — Z3A34 34 weeks gestation of pregnancy: Secondary | ICD-10-CM | POA: Diagnosis not present

## 2019-03-08 DIAGNOSIS — O219 Vomiting of pregnancy, unspecified: Secondary | ICD-10-CM | POA: Diagnosis not present

## 2019-03-09 DIAGNOSIS — R1012 Left upper quadrant pain: Secondary | ICD-10-CM | POA: Diagnosis not present

## 2019-03-09 DIAGNOSIS — I491 Atrial premature depolarization: Secondary | ICD-10-CM | POA: Diagnosis not present

## 2019-03-09 DIAGNOSIS — O219 Vomiting of pregnancy, unspecified: Secondary | ICD-10-CM | POA: Diagnosis not present

## 2019-03-09 DIAGNOSIS — R000 Tachycardia, unspecified: Secondary | ICD-10-CM | POA: Diagnosis not present

## 2019-03-09 DIAGNOSIS — Z3A34 34 weeks gestation of pregnancy: Secondary | ICD-10-CM | POA: Diagnosis not present

## 2019-03-09 DIAGNOSIS — O26893 Other specified pregnancy related conditions, third trimester: Secondary | ICD-10-CM | POA: Diagnosis not present

## 2019-03-10 DIAGNOSIS — Z3A34 34 weeks gestation of pregnancy: Secondary | ICD-10-CM | POA: Diagnosis not present

## 2019-03-10 DIAGNOSIS — R1012 Left upper quadrant pain: Secondary | ICD-10-CM | POA: Diagnosis not present

## 2019-03-10 DIAGNOSIS — O26893 Other specified pregnancy related conditions, third trimester: Secondary | ICD-10-CM | POA: Diagnosis not present

## 2019-03-10 DIAGNOSIS — O219 Vomiting of pregnancy, unspecified: Secondary | ICD-10-CM | POA: Diagnosis not present

## 2019-03-11 DIAGNOSIS — O26893 Other specified pregnancy related conditions, third trimester: Secondary | ICD-10-CM | POA: Diagnosis not present

## 2019-03-11 DIAGNOSIS — I499 Cardiac arrhythmia, unspecified: Secondary | ICD-10-CM | POA: Diagnosis not present

## 2019-03-11 DIAGNOSIS — R1012 Left upper quadrant pain: Secondary | ICD-10-CM | POA: Diagnosis not present

## 2019-03-11 DIAGNOSIS — Z3A34 34 weeks gestation of pregnancy: Secondary | ICD-10-CM | POA: Diagnosis not present

## 2019-03-11 DIAGNOSIS — O219 Vomiting of pregnancy, unspecified: Secondary | ICD-10-CM | POA: Diagnosis not present

## 2019-03-12 DIAGNOSIS — R1012 Left upper quadrant pain: Secondary | ICD-10-CM | POA: Diagnosis not present

## 2019-03-12 DIAGNOSIS — F319 Bipolar disorder, unspecified: Secondary | ICD-10-CM | POA: Diagnosis not present

## 2019-03-12 DIAGNOSIS — Z3A34 34 weeks gestation of pregnancy: Secondary | ICD-10-CM | POA: Diagnosis not present

## 2019-03-12 DIAGNOSIS — Z8659 Personal history of other mental and behavioral disorders: Secondary | ICD-10-CM | POA: Diagnosis not present

## 2019-03-12 DIAGNOSIS — Z331 Pregnant state, incidental: Secondary | ICD-10-CM | POA: Diagnosis not present

## 2019-03-12 DIAGNOSIS — O26893 Other specified pregnancy related conditions, third trimester: Secondary | ICD-10-CM | POA: Diagnosis not present

## 2019-03-12 DIAGNOSIS — O219 Vomiting of pregnancy, unspecified: Secondary | ICD-10-CM | POA: Diagnosis not present

## 2019-03-13 DIAGNOSIS — Z331 Pregnant state, incidental: Secondary | ICD-10-CM | POA: Diagnosis not present

## 2019-03-13 DIAGNOSIS — Z8659 Personal history of other mental and behavioral disorders: Secondary | ICD-10-CM | POA: Diagnosis not present

## 2019-03-13 DIAGNOSIS — O26893 Other specified pregnancy related conditions, third trimester: Secondary | ICD-10-CM | POA: Diagnosis not present

## 2019-03-13 DIAGNOSIS — O219 Vomiting of pregnancy, unspecified: Secondary | ICD-10-CM | POA: Diagnosis not present

## 2019-03-13 DIAGNOSIS — R1012 Left upper quadrant pain: Secondary | ICD-10-CM | POA: Diagnosis not present

## 2019-03-13 DIAGNOSIS — F319 Bipolar disorder, unspecified: Secondary | ICD-10-CM | POA: Diagnosis not present

## 2019-03-13 DIAGNOSIS — Z3A34 34 weeks gestation of pregnancy: Secondary | ICD-10-CM | POA: Diagnosis not present

## 2019-03-14 DIAGNOSIS — O210 Mild hyperemesis gravidarum: Secondary | ICD-10-CM | POA: Diagnosis not present

## 2019-03-14 DIAGNOSIS — O26893 Other specified pregnancy related conditions, third trimester: Secondary | ICD-10-CM | POA: Diagnosis not present

## 2019-03-14 DIAGNOSIS — Z3A35 35 weeks gestation of pregnancy: Secondary | ICD-10-CM | POA: Diagnosis not present

## 2019-03-14 DIAGNOSIS — R1012 Left upper quadrant pain: Secondary | ICD-10-CM | POA: Diagnosis not present

## 2019-03-15 DIAGNOSIS — Z3A35 35 weeks gestation of pregnancy: Secondary | ICD-10-CM | POA: Diagnosis not present

## 2019-03-15 DIAGNOSIS — O26893 Other specified pregnancy related conditions, third trimester: Secondary | ICD-10-CM | POA: Diagnosis not present

## 2019-03-15 DIAGNOSIS — O210 Mild hyperemesis gravidarum: Secondary | ICD-10-CM | POA: Diagnosis not present

## 2019-03-15 DIAGNOSIS — R1012 Left upper quadrant pain: Secondary | ICD-10-CM | POA: Diagnosis not present

## 2019-03-16 DIAGNOSIS — R1012 Left upper quadrant pain: Secondary | ICD-10-CM | POA: Diagnosis not present

## 2019-03-16 DIAGNOSIS — Z3A35 35 weeks gestation of pregnancy: Secondary | ICD-10-CM | POA: Diagnosis not present

## 2019-03-16 DIAGNOSIS — O210 Mild hyperemesis gravidarum: Secondary | ICD-10-CM | POA: Diagnosis not present

## 2019-03-16 DIAGNOSIS — O26893 Other specified pregnancy related conditions, third trimester: Secondary | ICD-10-CM | POA: Diagnosis not present

## 2019-03-17 DIAGNOSIS — O26893 Other specified pregnancy related conditions, third trimester: Secondary | ICD-10-CM | POA: Diagnosis not present

## 2019-03-17 DIAGNOSIS — R1012 Left upper quadrant pain: Secondary | ICD-10-CM | POA: Diagnosis not present

## 2019-03-17 DIAGNOSIS — O210 Mild hyperemesis gravidarum: Secondary | ICD-10-CM | POA: Diagnosis not present

## 2019-03-17 DIAGNOSIS — Z3A35 35 weeks gestation of pregnancy: Secondary | ICD-10-CM | POA: Diagnosis not present

## 2019-03-18 DIAGNOSIS — Z3A35 35 weeks gestation of pregnancy: Secondary | ICD-10-CM | POA: Diagnosis not present

## 2019-03-18 DIAGNOSIS — O211 Hyperemesis gravidarum with metabolic disturbance: Secondary | ICD-10-CM | POA: Diagnosis not present

## 2019-03-18 DIAGNOSIS — F603 Borderline personality disorder: Secondary | ICD-10-CM | POA: Diagnosis not present

## 2019-03-18 DIAGNOSIS — O906 Postpartum mood disturbance: Secondary | ICD-10-CM | POA: Diagnosis not present

## 2019-03-18 DIAGNOSIS — F419 Anxiety disorder, unspecified: Secondary | ICD-10-CM | POA: Diagnosis not present

## 2019-03-18 DIAGNOSIS — O26893 Other specified pregnancy related conditions, third trimester: Secondary | ICD-10-CM | POA: Diagnosis not present

## 2019-03-18 DIAGNOSIS — O210 Mild hyperemesis gravidarum: Secondary | ICD-10-CM | POA: Diagnosis not present

## 2019-03-18 DIAGNOSIS — E876 Hypokalemia: Secondary | ICD-10-CM | POA: Diagnosis not present

## 2019-03-18 DIAGNOSIS — Z8659 Personal history of other mental and behavioral disorders: Secondary | ICD-10-CM | POA: Diagnosis not present

## 2019-03-18 DIAGNOSIS — R45851 Suicidal ideations: Secondary | ICD-10-CM | POA: Diagnosis not present

## 2019-03-18 DIAGNOSIS — F319 Bipolar disorder, unspecified: Secondary | ICD-10-CM | POA: Diagnosis not present

## 2019-03-18 DIAGNOSIS — R112 Nausea with vomiting, unspecified: Secondary | ICD-10-CM | POA: Diagnosis not present

## 2019-03-18 DIAGNOSIS — O99341 Other mental disorders complicating pregnancy, first trimester: Secondary | ICD-10-CM | POA: Diagnosis not present

## 2019-03-18 DIAGNOSIS — O99281 Endocrine, nutritional and metabolic diseases complicating pregnancy, first trimester: Secondary | ICD-10-CM | POA: Diagnosis not present

## 2019-03-18 DIAGNOSIS — Z9049 Acquired absence of other specified parts of digestive tract: Secondary | ICD-10-CM | POA: Diagnosis not present

## 2019-03-18 DIAGNOSIS — R1012 Left upper quadrant pain: Secondary | ICD-10-CM | POA: Diagnosis not present

## 2019-03-19 DIAGNOSIS — R45851 Suicidal ideations: Secondary | ICD-10-CM | POA: Diagnosis not present

## 2019-03-19 DIAGNOSIS — E876 Hypokalemia: Secondary | ICD-10-CM | POA: Diagnosis not present

## 2019-03-19 DIAGNOSIS — Z3A35 35 weeks gestation of pregnancy: Secondary | ICD-10-CM | POA: Diagnosis not present

## 2019-03-19 DIAGNOSIS — F319 Bipolar disorder, unspecified: Secondary | ICD-10-CM | POA: Diagnosis not present

## 2019-03-19 DIAGNOSIS — O99343 Other mental disorders complicating pregnancy, third trimester: Secondary | ICD-10-CM | POA: Diagnosis not present

## 2019-03-19 DIAGNOSIS — F419 Anxiety disorder, unspecified: Secondary | ICD-10-CM | POA: Diagnosis not present

## 2019-03-19 DIAGNOSIS — R112 Nausea with vomiting, unspecified: Secondary | ICD-10-CM | POA: Diagnosis not present

## 2019-03-19 DIAGNOSIS — Z9049 Acquired absence of other specified parts of digestive tract: Secondary | ICD-10-CM | POA: Diagnosis not present

## 2019-03-19 DIAGNOSIS — Z6720 Type B blood, Rh positive: Secondary | ICD-10-CM | POA: Diagnosis not present

## 2019-03-19 DIAGNOSIS — O99283 Endocrine, nutritional and metabolic diseases complicating pregnancy, third trimester: Secondary | ICD-10-CM | POA: Diagnosis not present

## 2019-03-20 DIAGNOSIS — R000 Tachycardia, unspecified: Secondary | ICD-10-CM | POA: Diagnosis not present

## 2019-03-20 DIAGNOSIS — R1111 Vomiting without nausea: Secondary | ICD-10-CM | POA: Diagnosis not present

## 2019-03-20 DIAGNOSIS — F39 Unspecified mood [affective] disorder: Secondary | ICD-10-CM | POA: Diagnosis not present

## 2019-03-20 DIAGNOSIS — F419 Anxiety disorder, unspecified: Secondary | ICD-10-CM | POA: Diagnosis not present

## 2019-03-21 DIAGNOSIS — E876 Hypokalemia: Secondary | ICD-10-CM | POA: Diagnosis not present

## 2019-03-21 DIAGNOSIS — R1012 Left upper quadrant pain: Secondary | ICD-10-CM | POA: Diagnosis not present

## 2019-03-21 DIAGNOSIS — F17210 Nicotine dependence, cigarettes, uncomplicated: Secondary | ICD-10-CM | POA: Diagnosis not present

## 2019-03-21 DIAGNOSIS — F319 Bipolar disorder, unspecified: Secondary | ICD-10-CM | POA: Diagnosis not present

## 2019-03-21 DIAGNOSIS — Z9049 Acquired absence of other specified parts of digestive tract: Secondary | ICD-10-CM | POA: Diagnosis not present

## 2019-03-21 DIAGNOSIS — F603 Borderline personality disorder: Secondary | ICD-10-CM | POA: Diagnosis not present

## 2019-03-21 DIAGNOSIS — R7401 Elevation of levels of liver transaminase levels: Secondary | ICD-10-CM | POA: Diagnosis not present

## 2019-03-21 DIAGNOSIS — O210 Mild hyperemesis gravidarum: Secondary | ICD-10-CM | POA: Diagnosis not present

## 2019-03-21 DIAGNOSIS — O26893 Other specified pregnancy related conditions, third trimester: Secondary | ICD-10-CM | POA: Diagnosis not present

## 2019-03-21 DIAGNOSIS — F39 Unspecified mood [affective] disorder: Secondary | ICD-10-CM | POA: Diagnosis not present

## 2019-03-21 DIAGNOSIS — O99324 Drug use complicating childbirth: Secondary | ICD-10-CM | POA: Diagnosis not present

## 2019-03-21 DIAGNOSIS — O99334 Smoking (tobacco) complicating childbirth: Secondary | ICD-10-CM | POA: Diagnosis not present

## 2019-03-21 DIAGNOSIS — O99284 Endocrine, nutritional and metabolic diseases complicating childbirth: Secondary | ICD-10-CM | POA: Diagnosis not present

## 2019-03-21 DIAGNOSIS — O1494 Unspecified pre-eclampsia, complicating childbirth: Secondary | ICD-10-CM | POA: Diagnosis not present

## 2019-03-21 DIAGNOSIS — O99344 Other mental disorders complicating childbirth: Secondary | ICD-10-CM | POA: Diagnosis not present

## 2019-03-21 DIAGNOSIS — O1413 Severe pre-eclampsia, third trimester: Secondary | ICD-10-CM | POA: Diagnosis not present

## 2019-03-21 DIAGNOSIS — I959 Hypotension, unspecified: Secondary | ICD-10-CM | POA: Diagnosis not present

## 2019-03-21 DIAGNOSIS — Z3A36 36 weeks gestation of pregnancy: Secondary | ICD-10-CM | POA: Diagnosis not present

## 2019-03-21 DIAGNOSIS — F4321 Adjustment disorder with depressed mood: Secondary | ICD-10-CM | POA: Diagnosis not present

## 2019-03-22 DIAGNOSIS — O1413 Severe pre-eclampsia, third trimester: Secondary | ICD-10-CM | POA: Diagnosis not present

## 2019-03-22 DIAGNOSIS — O1414 Severe pre-eclampsia complicating childbirth: Secondary | ICD-10-CM | POA: Diagnosis not present

## 2019-03-22 DIAGNOSIS — Z3A36 36 weeks gestation of pregnancy: Secondary | ICD-10-CM | POA: Diagnosis not present

## 2019-03-22 DIAGNOSIS — F319 Bipolar disorder, unspecified: Secondary | ICD-10-CM | POA: Diagnosis not present

## 2019-03-22 DIAGNOSIS — O219 Vomiting of pregnancy, unspecified: Secondary | ICD-10-CM | POA: Diagnosis not present

## 2019-03-22 DIAGNOSIS — O99344 Other mental disorders complicating childbirth: Secondary | ICD-10-CM | POA: Diagnosis not present

## 2019-03-24 DIAGNOSIS — F3170 Bipolar disorder, currently in remission, most recent episode unspecified: Secondary | ICD-10-CM | POA: Diagnosis not present

## 2019-04-11 DIAGNOSIS — Z1389 Encounter for screening for other disorder: Secondary | ICD-10-CM | POA: Diagnosis not present

## 2019-04-11 DIAGNOSIS — F53 Postpartum depression: Secondary | ICD-10-CM | POA: Diagnosis not present

## 2019-05-09 DIAGNOSIS — F53 Postpartum depression: Secondary | ICD-10-CM | POA: Diagnosis not present

## 2019-05-09 DIAGNOSIS — F902 Attention-deficit hyperactivity disorder, combined type: Secondary | ICD-10-CM | POA: Diagnosis not present

## 2019-05-26 DIAGNOSIS — H02849 Edema of unspecified eye, unspecified eyelid: Secondary | ICD-10-CM | POA: Diagnosis not present

## 2019-06-07 DIAGNOSIS — M25472 Effusion, left ankle: Secondary | ICD-10-CM | POA: Diagnosis not present

## 2019-06-07 DIAGNOSIS — S99912A Unspecified injury of left ankle, initial encounter: Secondary | ICD-10-CM | POA: Diagnosis not present

## 2019-06-07 DIAGNOSIS — M7989 Other specified soft tissue disorders: Secondary | ICD-10-CM | POA: Diagnosis not present

## 2019-06-07 DIAGNOSIS — M79672 Pain in left foot: Secondary | ICD-10-CM | POA: Diagnosis not present

## 2019-06-07 DIAGNOSIS — M25572 Pain in left ankle and joints of left foot: Secondary | ICD-10-CM | POA: Diagnosis not present

## 2019-06-13 DIAGNOSIS — S93402A Sprain of unspecified ligament of left ankle, initial encounter: Secondary | ICD-10-CM | POA: Diagnosis not present

## 2019-06-27 ENCOUNTER — Other Ambulatory Visit: Payer: Self-pay

## 2019-06-27 ENCOUNTER — Ambulatory Visit: Payer: Medicaid Other | Admitting: Family Medicine

## 2019-06-27 ENCOUNTER — Encounter: Payer: Self-pay | Admitting: Family Medicine

## 2019-06-27 VITALS — BP 100/52 | HR 76 | Ht 62.5 in | Wt 219.0 lb

## 2019-06-27 DIAGNOSIS — K219 Gastro-esophageal reflux disease without esophagitis: Secondary | ICD-10-CM | POA: Diagnosis not present

## 2019-06-27 DIAGNOSIS — E663 Overweight: Secondary | ICD-10-CM | POA: Diagnosis not present

## 2019-06-27 DIAGNOSIS — Z0000 Encounter for general adult medical examination without abnormal findings: Secondary | ICD-10-CM | POA: Diagnosis not present

## 2019-06-27 MED ORDER — FAMOTIDINE 20 MG PO TABS: 20.0000 mg | ORAL_TABLET | Freq: Two times a day (BID) | ORAL | 1 refills | Status: DC

## 2019-06-27 NOTE — Progress Notes (Signed)
Date:  06/27/2019   Name:  Zoe Hunter   DOB:  09-Jan-1996   MRN:  [id number]   Chief Complaint: Establish Care (pcp moved)  Patient is a 24 year old female who presents for a comprehensive physical exam. The patient reports the following problems: none. Health maintenance has been reviewed up to date   Lab Results  Component Value Date   CREATININE 0.61 05/08/2016   BUN 11 05/08/2016   NA 138 05/08/2016   K 3.2 (L) 05/08/2016   CL 100 (L) 05/08/2016   CO2 27 05/08/2016   Lab Results  Component Value Date   CHOL 158 12/22/2011   HDL 58 12/22/2011   LDLCALC 90 12/22/2011   TRIG 49 12/22/2011   Lab Results  Component Value Date   TSH 1.27 12/22/2011   Lab Results  Component Value Date   HGBA1C 4.7 12/22/2011   Lab Results  Component Value Date   WBC 12.2 (H) 05/08/2016   HGB 13.2 05/08/2016   HCT 38.7 05/08/2016   MCV 80.2 05/08/2016   PLT 309 05/08/2016   Lab Results  Component Value Date   ALT 15 05/08/2016   AST 24 05/08/2016   ALKPHOS 84 05/08/2016   BILITOT 0.6 05/08/2016     Review of Systems  Constitutional: Negative.  Negative for chills, fatigue, fever and unexpected weight change.  HENT: Negative for congestion, ear discharge, ear pain, rhinorrhea, sinus pressure, sneezing and sore throat.   Eyes: Negative for photophobia, pain, discharge, redness and itching.  Respiratory: Negative for cough, shortness of breath, wheezing and stridor.   Cardiovascular: Negative for chest pain, palpitations and leg swelling.  Gastrointestinal: Negative for abdominal pain, blood in stool, constipation, diarrhea, nausea and vomiting.  Endocrine: Negative for cold intolerance, heat intolerance, polydipsia, polyphagia and polyuria.  Genitourinary: Negative for dysuria, flank pain, frequency, hematuria, menstrual problem, pelvic pain, urgency, vaginal bleeding and vaginal discharge.  Musculoskeletal: Negative for arthralgias, back pain and myalgias.  Skin: Negative  for rash.  Allergic/Immunologic: Negative for environmental allergies and food allergies.  Neurological: Negative for dizziness, weakness, light-headedness, numbness and headaches.  Hematological: Negative for adenopathy. Does not bruise/bleed easily.  Psychiatric/Behavioral: Negative for dysphoric mood. The patient is not nervous/anxious.     Patient Active Problem List   Diagnosis Date Noted  . ADD (attention deficit disorder) 02/19/2015  . Bipolar affective disorder (HCC) 02/19/2015  . Clinical depression 02/19/2015  . Cholelithiasis 01/28/2015    No Known Allergies  Past Surgical History:  Procedure Laterality Date  . CHOLECYSTECTOMY N/A 02/16/2015   Procedure: LAPAROSCOPIC CHOLECYSTECTOMY;  Surgeon: Natale Lay, MD;  Location: [facility] ORS;  Service: General;  Laterality: N/A;  . ESOPHAGOGASTRODUODENOSCOPY (EGD) WITH PROPOFOL N/A 05/05/2016   Procedure: ESOPHAGOGASTRODUODENOSCOPY (EGD) WITH PROPOFOL;  Surgeon: Christena Deem, MD;  Location: [facility] ENDOSCOPY;  Service: Endoscopy;  Laterality: N/A;  . TONSILLECTOMY    . WISDOM TOOTH EXTRACTION      Social History   Tobacco Use  . Smoking status: Never Smoker  . Smokeless tobacco: Never Used  . Tobacco comment: boyfriend smokes  Substance Use Topics  . Alcohol use: No  . Drug use: Yes    Types: Marijuana     Medication list has been reviewed and updated.  Current Meds  Medication Sig  . norethindrone-ethinyl estradiol (LOESTRIN FE) 1-20 MG-MCG tablet Take by mouth.    PHQ 2/9 Scores 06/27/2019  PHQ - 2 Score 0  PHQ- 9 Score 0    BP Readings  from Last 3 Encounters:  06/27/19 (!) 100/52  06/06/18 (!) 107/52  05/08/16 125/72    Physical Exam Vitals and nursing note reviewed.  Constitutional:      Appearance: She is well-developed.  HENT:     Head: Normocephalic.     Right Ear: External ear normal.     Left Ear: External ear normal.  Eyes:     General: Lids are everted, no foreign bodies appreciated. No  scleral icterus.       Left eye: No foreign body or hordeolum.     Conjunctiva/sclera: Conjunctivae normal.     Right eye: Right conjunctiva is not injected.     Left eye: Left conjunctiva is not injected.     Pupils: Pupils are equal, round, and reactive to light.  Neck:     Thyroid: No thyromegaly.     Vascular: No JVD.     Trachea: No tracheal deviation.  Cardiovascular:     Rate and Rhythm: Normal rate and regular rhythm.     Heart sounds: Normal heart sounds. No murmur. No friction rub. No gallop.   Pulmonary:     Effort: Pulmonary effort is normal. No respiratory distress.     Breath sounds: Normal breath sounds. No wheezing or rales.  Abdominal:     General: Bowel sounds are normal.     Palpations: Abdomen is soft. There is no mass.     Tenderness: There is no abdominal tenderness. There is no guarding or rebound.  Musculoskeletal:        General: No tenderness. Normal range of motion.     Cervical back: Normal range of motion and neck supple.  Lymphadenopathy:     Cervical: No cervical adenopathy.  Skin:    General: Skin is warm.     Findings: No rash.  Neurological:     Mental Status: She is alert and oriented to person, place, and time.     Cranial Nerves: No cranial nerve deficit.     Deep Tendon Reflexes: Reflexes normal.  Psychiatric:        Mood and Affect: Mood is not anxious or depressed.     Wt Readings from Last 3 Encounters:  06/27/19 219 lb (99.3 kg)  06/06/18 215 lb (97.5 kg)  05/08/16 196 lb (88.9 kg)    BP (!) 100/52   Pulse 76   Ht 5' 2.5" (1.588 m)   Wt 219 lb (99.3 kg)   LMP 05/29/2019 (Approximate)   BMI 39.42 kg/m   Assessment and Plan: 1. Encounter for medical examination to establish care Patient establishing care with new physician.  Review of chart for previous encounters, most recent labs, most recent imaging, and care everywhere.  Patient is currently in a boot for a sprained ankle and is tolerating well.  Currently in no need of  any other medications other than what is mentioned below.  2. Gastroesophageal reflux disease without esophagitis Patient still has some occasional nausea which is possibly secondary to her history of reflux disease.  Patient has done well on famotidine 20 mg in the past and we will refill that and may take on a as needed basis. - famotidine (PEPCID) 20 MG tablet; Take 1 tablet (20 mg total) by mouth 2 (two) times daily.  Dispense: 60 tablet; Refill: 1  3. Overweight Patient was noted to have some weight gain postpartum and we have encouraged to use a Mediterranean diet as guidelines for reduction of caloric count and gradual weight loss.

## 2019-06-27 NOTE — Patient Instructions (Signed)

## 2019-07-30 ENCOUNTER — Ambulatory Visit (INDEPENDENT_AMBULATORY_CARE_PROVIDER_SITE_OTHER): Payer: Medicaid Other | Admitting: Family Medicine

## 2019-07-30 ENCOUNTER — Other Ambulatory Visit: Payer: Self-pay

## 2019-07-30 ENCOUNTER — Encounter: Payer: Self-pay | Admitting: Family Medicine

## 2019-07-30 VITALS — BP 112/78 | HR 79 | Ht 62.5 in | Wt 228.0 lb

## 2019-07-30 DIAGNOSIS — H1013 Acute atopic conjunctivitis, bilateral: Secondary | ICD-10-CM

## 2019-07-30 MED ORDER — LORATADINE 10 MG PO TABS: 10.0000 mg | ORAL_TABLET | Freq: Every day | ORAL | 11 refills | Status: DC

## 2019-07-30 MED ORDER — OLOPATADINE HCL 0.1 % OP SOLN: 1.0000 [drp] | Freq: Two times a day (BID) | OPHTHALMIC | 12 refills | Status: DC

## 2019-07-30 NOTE — Progress Notes (Signed)
Date:  07/30/2019   Name:  Zoe Hunter   DOB:  08-Nov-1995   MRN:  [id number]   Chief Complaint: Swollen Left Eye (Started beginning of March. Took Amoxicillin twice daily. Started in both eyes- now its only in the left eye. Sore. Vision blurs on and off. Patient is breastfeeding her 59 month old son currently.  )  Conjunctivitis  The current episode started more than 2 weeks ago. Progression since onset: waxes and wanes. The problem is mild. Relieved by: pink eye drop/augmentin. Associated symptoms include eye itching and eye discharge. Pertinent negatives include no fever, no decreased vision, no photophobia, no abdominal pain, no constipation, no diarrhea, no nausea, no vomiting, no congestion, no ear discharge, no ear pain, no headaches, no rhinorrhea, no sore throat, no stridor, no cough, no wheezing, no rash, no eye pain and no eye redness.    Lab Results  Component Value Date   CREATININE 0.61 05/08/2016   BUN 11 05/08/2016   NA 138 05/08/2016   K 3.2 (L) 05/08/2016   CL 100 (L) 05/08/2016   CO2 27 05/08/2016   Lab Results  Component Value Date   CHOL 158 12/22/2011   HDL 58 12/22/2011   LDLCALC 90 12/22/2011   TRIG 49 12/22/2011   Lab Results  Component Value Date   TSH 1.27 12/22/2011   Lab Results  Component Value Date   HGBA1C 4.7 12/22/2011   Lab Results  Component Value Date   WBC 12.2 (H) 05/08/2016   HGB 13.2 05/08/2016   HCT 38.7 05/08/2016   MCV 80.2 05/08/2016   PLT 309 05/08/2016   Lab Results  Component Value Date   ALT 15 05/08/2016   AST 24 05/08/2016   ALKPHOS 84 05/08/2016   BILITOT 0.6 05/08/2016     Review of Systems  Constitutional: Negative.  Negative for chills, fatigue, fever and unexpected weight change.  HENT: Negative for congestion, ear discharge, ear pain, rhinorrhea, sinus pressure, sneezing and sore throat.   Eyes: Positive for discharge and itching. Negative for photophobia, pain and redness.  Respiratory: Negative for  cough, shortness of breath, wheezing and stridor.   Gastrointestinal: Negative for abdominal pain, blood in stool, constipation, diarrhea, nausea and vomiting.  Endocrine: Negative for cold intolerance, heat intolerance, polydipsia, polyphagia and polyuria.  Genitourinary: Negative for dysuria, flank pain, frequency, hematuria, menstrual problem, pelvic pain, urgency, vaginal bleeding and vaginal discharge.  Musculoskeletal: Negative for arthralgias, back pain and myalgias.  Skin: Negative for rash.  Allergic/Immunologic: Negative for environmental allergies and food allergies.  Neurological: Negative for dizziness, weakness, light-headedness, numbness and headaches.  Hematological: Negative for adenopathy. Does not bruise/bleed easily.  Psychiatric/Behavioral: Negative for dysphoric mood. The patient is not nervous/anxious.     Patient Active Problem List   Diagnosis Date Noted  . ADD (attention deficit disorder) 02/19/2015  . Bipolar affective disorder ([place]) 02/19/2015  . Clinical depression 02/19/2015  . Cholelithiasis 01/28/2015    No Known Allergies  Past Surgical History:  Procedure Laterality Date  . CHOLECYSTECTOMY N/A 02/16/2015   Procedure: LAPAROSCOPIC CHOLECYSTECTOMY;  Surgeon: Sherri Rad, MD;  Location: [facility] ORS;  Service: General;  Laterality: N/A;  . ESOPHAGOGASTRODUODENOSCOPY (EGD) WITH PROPOFOL N/A 05/05/2016   Procedure: ESOPHAGOGASTRODUODENOSCOPY (EGD) WITH PROPOFOL;  Surgeon: Lollie Sails, MD;  Location: [facility] ENDOSCOPY;  Service: Endoscopy;  Laterality: N/A;  . TONSILLECTOMY    . WISDOM TOOTH EXTRACTION      Social History   Tobacco Use  . Smoking status:  Never Smoker  . Smokeless tobacco: Never Used  . Tobacco comment: boyfriend smokes  Substance Use Topics  . Alcohol use: No  . Drug use: Yes    Types: Marijuana     Medication list has been reviewed and updated.  Current Meds  Medication Sig  . amphetamine-dextroamphetamine (ADDERALL) 30 MG  tablet Take 30 mg by mouth daily. cBC  . famotidine (PEPCID) 20 MG tablet Take 1 tablet (20 mg total) by mouth 2 (two) times daily.  . norethindrone-ethinyl estradiol (LOESTRIN FE) 1-20 MG-MCG tablet Take by mouth.    PHQ 2/9 Scores 07/30/2019 06/27/2019  PHQ - 2 Score 0 0  PHQ- 9 Score 0 0    BP Readings from Last 3 Encounters:  07/30/19 112/78  06/27/19 (!) 100/52  06/06/18 (!) 107/52    Physical Exam Vitals and nursing note reviewed.  Constitutional:      Appearance: She is well-developed.  HENT:     Head: Normocephalic.     Right Ear: Tympanic membrane, ear canal and external ear normal.     Left Ear: Tympanic membrane, ear canal and external ear normal.     Nose: Nose normal.     Mouth/Throat:     Mouth: Mucous membranes are moist.  Eyes:     General: Lids are everted, no foreign bodies appreciated. No scleral icterus.       Right eye: No foreign body or hordeolum.        Left eye: No foreign body or hordeolum.     Conjunctiva/sclera:     Right eye: Right conjunctiva is injected. No exudate.    Left eye: Left conjunctiva is injected. No exudate.    Pupils: Pupils are equal, round, and reactive to light.  Neck:     Thyroid: No thyroid mass, thyromegaly or thyroid tenderness.     Vascular: No JVD.     Trachea: No tracheal deviation.  Cardiovascular:     Rate and Rhythm: Normal rate and regular rhythm.     Heart sounds: Normal heart sounds, S1 normal and S2 normal. No murmur. No systolic murmur. No diastolic murmur. No friction rub. No gallop. No S3 or S4 sounds.   Pulmonary:     Effort: Pulmonary effort is normal. No respiratory distress.     Breath sounds: Normal breath sounds. No wheezing or rales.  Abdominal:     General: Bowel sounds are normal.     Palpations: Abdomen is soft. There is no mass.     Tenderness: There is no abdominal tenderness. There is no guarding or rebound.  Musculoskeletal:        General: No tenderness. Normal range of motion.     Cervical  back: Normal range of motion and neck supple.  Lymphadenopathy:     Cervical: No cervical adenopathy.     Right cervical: No superficial, deep or posterior cervical adenopathy.    Left cervical: No superficial, deep or posterior cervical adenopathy.  Skin:    General: Skin is warm.     Findings: No rash.  Neurological:     Mental Status: She is alert and oriented to person, place, and time.     Cranial Nerves: No cranial nerve deficit.     Deep Tendon Reflexes: Reflexes normal.  Psychiatric:        Mood and Affect: Mood is not anxious or depressed.     Wt Readings from Last 3 Encounters:  07/30/19 228 lb (103.4 kg)  06/27/19 219 lb (99.3 kg)  06/06/18 215 lb (97.5 kg)    BP 112/78   Pulse 79   Ht 5' 2.5" (1.588 m)   Wt 228 lb (103.4 kg)   LMP 07/30/2019 Comment: BREASTFEEDING  SpO2 98%   BMI 41.04 kg/m   Assessment and Plan:  1. Allergic conjunctivitis of both eyes New onset.  Waxes and wanes.  Unresolved on Augmentin.  Exam and history is consistent with allergic conjunctivitis.  We will attempt to use an antihistamine approach with loratadine 10 mg once a day and Patanol 0.1% ophthalmic drops. - olopatadine (PATANOL) 0.1 % ophthalmic solution; Place 1 drop into both eyes 2 (two) times daily.  Dispense: 5 mL; Refill: 12 - loratadine (CLARITIN) 10 MG tablet; Take 1 tablet (10 mg total) by mouth daily.  Dispense: 30 tablet; Refill: 11

## 2019-07-30 NOTE — Patient Instructions (Signed)
Allergic Conjunctivitis, Adult  Allergic conjunctivitis is inflammation of the clear membrane that covers the white part of your eye and the inner surface of your eyelid (conjunctiva). The inflammation is caused by allergies. The blood vessels in the conjunctiva become inflamed and this causes the eyes to become red or pink. The eyes often feel itchy. Allergic conjunctivitis cannot be spread from one person to another person (is not contagious). What are the causes? This condition is caused by an allergic reaction. Common causes of an allergic reaction (allergens) include:  Outdoor allergens, such as: ? Pollen. ? Grass and weeds. ? Mold spores.  Indoor allergens, such as: ? Dust. ? Smoke. ? Mold. ? Pet dander. ? Animal hair. What increases the risk? You may be more likely to develop this condition if you have a family history of allergies, such as:  Allergic rhinitis.  Bronchial asthma.  Atopic dermatitis. What are the signs or symptoms? Symptoms of this condition include eyes that are:  Itchy.  Red.  Watery.  Puffy. Your eyes may also:  Sting or burn.  Have clear drainage coming from them. How is this diagnosed? This condition may be diagnosed by medical history and physical exam. If you have drainage from your eyes, it may be tested to rule out other causes of conjunctivitis. You may also need to see a health care provider who specializes in treating allergies (allergist) or eye conditions (ophthalmologist) for tests to confirm the diagnosis. You may have:  Skin tests to see which allergens are causing your symptoms. These tests involve pricking the skin with a tiny needle and exposing the skin to small amounts of potential allergens to see if your skin reacts.  Blood tests.  Tissue scrapings from your eyelid. These will be examined under a microscope. How is this treated? Treatments for this condition may include:  Cold cloths (compresses) to soothe itching and  swelling.  Washing the face to remove allergens.  Eye drops. These may be prescription or over-the-counter. There are several different types. You may need to try different types to see which one works best for you. Your may need: ? Eye drops that block the allergic reaction (antihistamine). ? Eye drops that reduce swelling and irritation (anti-inflammatory). ? Steroid eye drops to lessen a severe reaction (vernal conjunctivitis).  Oral antihistamine medicines to reduce your allergic reaction. You may need these if eye drops do not help or are difficult to use. Follow these instructions at home:  Avoid known allergens whenever possible.  Take or apply over-the-counter and prescription medicines only as told by your health care provider. These include any eye drops.  Apply a cool, clean washcloth to your eye for 10-20 minutes, 3-4 times a day.  Do not touch or rub your eyes.  Do not wear contact lenses until the inflammation is gone. Wear glasses instead.  Do not wear eye makeup until the inflammation is gone.  Keep all follow-up visits as told by your health care provider. This is important. Contact a health care provider if:  Your symptoms get worse or do not improve with treatment.  You have mild eye pain.  You have sensitivity to light.  You have spots or blisters on your eyes.  You have pus draining from your eye.  You have a fever. Get help right away if:  You have redness, swelling, or other symptoms in only one eye.  Your vision is blurred or you have vision changes.  You have severe eye pain. This information   is not intended to replace advice given to you by your health care provider. Make sure you discuss any questions you have with your health care provider. Document Revised: 01/26/2017 Document Reviewed: 08/27/2015 Elsevier Patient Education  2020 [employer]  

## 2019-08-01 ENCOUNTER — Telehealth: Payer: Self-pay | Admitting: Family Medicine

## 2019-08-01 DIAGNOSIS — H1013 Acute atopic conjunctivitis, bilateral: Secondary | ICD-10-CM

## 2019-08-01 DIAGNOSIS — F900 Attention-deficit hyperactivity disorder, predominantly inattentive type: Secondary | ICD-10-CM | POA: Diagnosis not present

## 2019-08-01 MED ORDER — OLOPATADINE HCL 0.1 % OP SOLN: 1.0000 [drp] | Freq: Two times a day (BID) | OPHTHALMIC | 12 refills | Status: DC

## 2019-08-01 NOTE — Telephone Encounter (Signed)
Pt called in to update provider. Pt was told by pharmacy that Rx olopatadine (PATANOL) 0.1 % ophthalmic solution was received. Pt would like to have resent.   Confirmed pharmacy: [facility] [address]  [address] [place], [place] [address]

## 2019-08-04 NOTE — Telephone Encounter (Signed)
Pt will have to pay out of pocket for this med

## 2019-08-04 NOTE — Telephone Encounter (Signed)
Pt states the olopatadine (PATANOL) 0.1 % ophthalmic solution  Needs PRIOR Authorization .  Pt has medicaid. Pt states she has pink eye and needs the eye drops asap

## 2019-08-13 DIAGNOSIS — H5213 Myopia, bilateral: Secondary | ICD-10-CM | POA: Diagnosis not present

## 2019-09-17 ENCOUNTER — Ambulatory Visit: Payer: Self-pay

## 2019-09-17 NOTE — Telephone Encounter (Signed)
Noted- go to urgent care

## 2019-09-17 NOTE — Telephone Encounter (Signed)
Patient called after being seen for eye irritation and swelling several days ago at [facility].  She states that it was recommended that she use medication that did not work. Today her left eye is swollen itchy and draining yellow. She states the it is swollen.  She is able to see but cant look up because of swelling. Eye is not red. She rates the pain mild 1-2  She states it is more itchy. She did have cough but it is gone today. Per protocol patient will go to UC for evaluation of her left eye. No appointments available in office. Care advice was read to patient. She verbalized understanding. She will call for follow up with Dr Yetta Barre after UC evaluation. Reason for Disposition  Eye is very swollen (shut or almost)  Answer Assessment - Initial Assessment Questions 1. EYE DISCHARGE: "Is the discharge in one or both eyes?" "What color is it?" "How much is there?" "When did the discharge start?"      Yellow drainage 2. REDNESS OF SCLERA: "Is the redness in one or both eyes?" "When did the redness start?"     No redness left eye 3. EYELIDS: "Are the eyelids red or swollen?" If Yes, ask: "How much?"      yes 4. VISION: "Is there any difficulty seeing clearly?"     No still see  5. PAIN: "Is there any pain? If Yes, ask: "How bad is it?" (Scale 1-10; or mild, moderate, severe)    - MILD (1-3): doesn't interfere with normal activities     - MODERATE (4-7): interferes with normal activities or awakens from sleep    - SEVERE (8-10): excruciating pain, unable to do any normal activities       1-2 sore only itches 6. CONTACT LENS: "Do you wear contacts?"     No 7. OTHER SYMPTOMS: "Do you have any other symptoms?" (e.g., fever, runny nose, cough)     Coughed yesterday but not today 8. PREGNANCY: "Is there any chance you are pregnant?" "When was your last menstrual period?"    No first of July menses  Protocols used: EYE - PUS OR DISCHARGE-A-AH

## 2019-09-18 ENCOUNTER — Ambulatory Visit (INDEPENDENT_AMBULATORY_CARE_PROVIDER_SITE_OTHER): Payer: Medicaid Other | Admitting: Family Medicine

## 2019-09-18 ENCOUNTER — Other Ambulatory Visit: Payer: Self-pay

## 2019-09-18 ENCOUNTER — Encounter: Payer: Self-pay | Admitting: Family Medicine

## 2019-09-18 VITALS — BP 104/62 | HR 64 | Ht 62.5 in | Wt 231.0 lb

## 2019-09-18 DIAGNOSIS — H1013 Acute atopic conjunctivitis, bilateral: Secondary | ICD-10-CM

## 2019-09-18 MED ORDER — TOBRAMYCIN-DEXAMETHASONE 0.3-0.1 % OP SUSP: 1.0000 [drp] | OPHTHALMIC | 0 refills | Status: DC

## 2019-09-18 NOTE — Progress Notes (Signed)
Date:  09/18/2019   Name:  Zoe Hunter   DOB:  [birth year]   MRN:  [id number]   Chief Complaint: Conjunctivitis (L) eye- started last week- been using pataday eye drops- still swollen, feels like sand is in it and draining.)  Conjunctivitis  The current episode started more than 1 week ago. The onset was gradual. Progression since onset: wanes and waxes. The problem is mild. Nothing relieves the symptoms. Associated symptoms include eye itching, eye discharge and eye redness. Pertinent negatives include no fever, no photophobia, no abdominal pain, no constipation, no diarrhea, no nausea, no vomiting, no congestion, no ear discharge, no ear pain, no headaches, no rhinorrhea, no sore throat, no stridor, no cough, no wheezing, no rash and no eye pain.    Lab Results  Component Value Date   CREATININE 0.61 05/08/2016   BUN 11 05/08/2016   NA 138 05/08/2016   K 3.2 (L) 05/08/2016   CL 100 (L) 05/08/2016   CO2 27 05/08/2016   Lab Results  Component Value Date   CHOL 158 12/22/2011   HDL 58 12/22/2011   LDLCALC 90 12/22/2011   TRIG 49 12/22/2011   Lab Results  Component Value Date   TSH 1.27 12/22/2011   Lab Results  Component Value Date   HGBA1C 4.7 12/22/2011   Lab Results  Component Value Date   WBC 12.2 (H) 05/08/2016   HGB 13.2 05/08/2016   HCT 38.7 05/08/2016   MCV 80.2 05/08/2016   PLT 309 05/08/2016   Lab Results  Component Value Date   ALT 15 05/08/2016   AST 24 05/08/2016   ALKPHOS 84 05/08/2016   BILITOT 0.6 05/08/2016     Review of Systems  Constitutional: Negative.  Negative for chills, fatigue, fever and unexpected weight change.  HENT: Negative for congestion, ear discharge, ear pain, rhinorrhea, sinus pressure, sneezing and sore throat.   Eyes: Positive for discharge, redness, itching and visual disturbance. Negative for photophobia and pain.  Respiratory: Negative for cough, shortness of breath, wheezing and stridor.   Gastrointestinal: Negative  for abdominal pain, blood in stool, constipation, diarrhea, nausea and vomiting.  Endocrine: Negative for cold intolerance, heat intolerance, polydipsia, polyphagia and polyuria.  Genitourinary: Negative for dysuria, flank pain, frequency, hematuria, menstrual problem, pelvic pain, urgency, vaginal bleeding and vaginal discharge.  Musculoskeletal: Negative for arthralgias, back pain and myalgias.  Skin: Negative for rash.  Allergic/Immunologic: Negative for environmental allergies and food allergies.  Neurological: Negative for dizziness, weakness, light-headedness, numbness and headaches.  Hematological: Negative for adenopathy. Does not bruise/bleed easily.  Psychiatric/Behavioral: Negative for dysphoric mood. The patient is not nervous/anxious.     Patient Active Problem List   Diagnosis Date Noted  . ADD (attention deficit disorder) 02/19/2015  . Bipolar affective disorder (HCC) 02/19/2015  . Clinical depression 02/19/2015  . Cholelithiasis 01/28/2015    No Known Allergies  Past Surgical History:  Procedure Laterality Date  . CHOLECYSTECTOMY N/A 02/16/2015   Procedure: LAPAROSCOPIC CHOLECYSTECTOMY;  Surgeon: Natale Lay, MD;  Location: [facility] ORS;  Service: General;  Laterality: N/A;  . ESOPHAGOGASTRODUODENOSCOPY (EGD) WITH PROPOFOL N/A 05/05/2016   Procedure: ESOPHAGOGASTRODUODENOSCOPY (EGD) WITH PROPOFOL;  Surgeon: Christena Deem, MD;  Location: [facility] ENDOSCOPY;  Service: Endoscopy;  Laterality: N/A;  . TONSILLECTOMY    . WISDOM TOOTH EXTRACTION      Social History   Tobacco Use  . Smoking status: Never Smoker  . Smokeless tobacco: Never Used  . Tobacco comment: boyfriend smokes  Vaping Use  .  Vaping Use: Some days  Substance Use Topics  . Alcohol use: No  . Drug use: Yes    Types: Marijuana     Medication list has been reviewed and updated.  Current Meds  Medication Sig  . amphetamine-dextroamphetamine (ADDERALL) 30 MG tablet Take 30 mg by mouth daily. cBC  .  famotidine (PEPCID) 20 MG tablet Take 1 tablet (20 mg total) by mouth 2 (two) times daily. (Patient taking differently: Take 20 mg by mouth as needed. )  . olopatadine (PATANOL) 0.1 % ophthalmic solution Place 1 drop into both eyes 2 (two) times daily.    PHQ 2/9 Scores 09/18/2019 07/30/2019 06/27/2019  PHQ - 2 Score 0 0 0  PHQ- 9 Score 0 0 0    GAD 7 : Generalized Anxiety Score 09/18/2019 07/30/2019 06/27/2019  Nervous, Anxious, on Edge 0 0 0  Control/stop worrying 0 0 0  Worry too much - different things 0 0 0  Trouble relaxing 0 0 0  Restless 0 0 0  Easily annoyed or irritable 0 0 1  Afraid - awful might happen 0 0 2  Total GAD 7 Score 0 0 3  Anxiety Difficulty - Not difficult at all Not difficult at all    BP Readings from Last 3 Encounters:  09/18/19 (!) 104/62  07/30/19 112/78  06/27/19 (!) 100/52    Physical Exam Vitals and nursing note reviewed.  Constitutional:      General: She is not in acute distress.    Appearance: She is not diaphoretic.  HENT:     Head: Normocephalic and atraumatic.     Right Ear: Tympanic membrane and external ear normal.     Left Ear: Tympanic membrane and external ear normal.     Nose: Nose normal.  Eyes:     General: Lids are normal. Lids are everted, no foreign bodies appreciated. Gaze aligned appropriately. No allergic shiner.       Right eye: No discharge.        Left eye: No discharge.     Conjunctiva/sclera:     Right eye: Right conjunctiva is injected.     Left eye: Left conjunctiva is injected.     Pupils: Pupils are equal, round, and reactive to light.     Comments: Conjunctival cobblestone bilateral  Neck:     Thyroid: No thyromegaly.     Vascular: No JVD.  Cardiovascular:     Rate and Rhythm: Normal rate and regular rhythm.     Heart sounds: Normal heart sounds. No murmur heard.  No friction rub. No gallop.   Pulmonary:     Effort: Pulmonary effort is normal.     Breath sounds: Normal breath sounds.  Abdominal:      General: Bowel sounds are normal.     Palpations: Abdomen is soft. There is no mass.     Tenderness: There is no abdominal tenderness. There is no guarding.  Musculoskeletal:        General: Normal range of motion.     Cervical back: Normal range of motion and neck supple.  Lymphadenopathy:     Cervical: No cervical adenopathy.  Skin:    General: Skin is warm and dry.  Neurological:     Mental Status: She is alert.     Deep Tendon Reflexes: Reflexes are normal and symmetric.     Wt Readings from Last 3 Encounters:  09/18/19 (!) 231 lb (104.8 kg)  07/30/19 228 lb (103.4 kg)  06/27/19 219 lb (99.3  kg)    BP (!) 104/62   Pulse 64   Ht 5' 2.5" (1.588 m)   Wt (!) 231 lb (104.8 kg)   LMP 08/31/2019 (Approximate)   BMI 41.58 kg/m   Assessment and Plan:   1. Allergic conjunctivitis of both eyes Acute.  Persistent and recurrent.  Patient has see optometrist who suggested Pataday and I have also suggested that she continue this in addition to take over-the-counter antihistamine.  Given the nature that this is been going on for months I have given her a short.  Of some TobraDex ophthalmic solution 1 drop in each eye every 4 hours for 4 to 5 days just to get a level of calm and that the Pataday can be used afterwards.  Of also suggested that she return to her optometrist for reevaluation I saw no foreign body in this does not behave like a foreign body but to recheck. - tobramycin-dexamethasone (TOBRADEX) ophthalmic solution; Place 1 drop into both eyes every 4 (four) hours while awake.  Dispense: 5 mL; Refill: 0

## 2019-09-22 DIAGNOSIS — H10423 Simple chronic conjunctivitis, bilateral: Secondary | ICD-10-CM | POA: Diagnosis not present

## 2019-10-24 DIAGNOSIS — F900 Attention-deficit hyperactivity disorder, predominantly inattentive type: Secondary | ICD-10-CM | POA: Diagnosis not present

## 2019-10-24 DIAGNOSIS — F331 Major depressive disorder, recurrent, moderate: Secondary | ICD-10-CM | POA: Diagnosis not present

## 2019-11-05 DIAGNOSIS — H00014 Hordeolum externum left upper eyelid: Secondary | ICD-10-CM | POA: Diagnosis not present

## 2019-12-11 ENCOUNTER — Other Ambulatory Visit: Payer: Self-pay

## 2019-12-11 DIAGNOSIS — Z20822 Contact with and (suspected) exposure to covid-19: Secondary | ICD-10-CM | POA: Diagnosis not present

## 2019-12-12 LAB — SARS-COV-2, NAA 2 DAY TAT

## 2019-12-12 LAB — NOVEL CORONAVIRUS, NAA: SARS-CoV-2, NAA: NOT DETECTED

## 2019-12-15 ENCOUNTER — Other Ambulatory Visit: Payer: Medicaid Other

## 2019-12-15 ENCOUNTER — Ambulatory Visit: Payer: Self-pay

## 2019-12-15 DIAGNOSIS — Z20822 Contact with and (suspected) exposure to covid-19: Secondary | ICD-10-CM | POA: Diagnosis not present

## 2019-12-15 NOTE — Telephone Encounter (Signed)
Pt. Reports she started felling bad last Tuesday with sore throat. Then had cough, fever, body aches, loss of taste and smell. Covid 19 test is negative. Wants to know from PCP what to do now. Instructed pt. She may need to retest. Is supposed to work this week, but is still sick. Please advise pt.  Answer Assessment - Initial Assessment Questions 1. COVID-19 DIAGNOSIS: "Who made your Coronavirus (COVID-19) diagnosis?" "Was it confirmed by a positive lab test?" If not diagnosed by a HCP, ask "Are there lots of cases (community spread) where you live?" (See public health department website, if unsure)     [facility] - negative 2. COVID-19 EXPOSURE: "Was there any known exposure to COVID before the symptoms began?" CDC Definition of close contact: within 6 feet (2 meters) for a total of 15 minutes or more over a 24-hour period.      Yes 3. ONSET: "When did the COVID-19 symptoms start?"      Tuesday - sore throat 4. WORST SYMPTOM: "What is your worst symptom?" (e.g., cough, fever, shortness of breath, muscle aches)     Cough 5. COUGH: "Do you have a cough?" If Yes, ask: "How bad is the cough?"       Yes 6. FEVER: "Do you have a fever?" If Yes, ask: "What is your temperature, how was it measured, and when did it start?"     99 7. RESPIRATORY STATUS: "Describe your breathing?" (e.g., shortness of breath, wheezing, unable to speak)      No 8. BETTER-SAME-WORSE: "Are you getting better, staying the same or getting worse compared to yesterday?"  If getting worse, ask, "In what way?"     Same 9. HIGH RISK DISEASE: "Do you have any chronic medical problems?" (e.g., asthma, heart or lung disease, weak immune system, obesity, etc.)     No 10. PREGNANCY: "Is there any chance you are pregnant?" "When was your last menstrual period?"       No 11. OTHER SYMPTOMS: "Do you have any other symptoms?"  (e.g., chills, fatigue, headache, loss of smell or taste, muscle pain, sore throat; new loss of smell or taste  especially support the diagnosis of COVID-19)       Body aches, sore throat, loss of taste and smell.  Protocols used: CORONAVIRUS (COVID-19) DIAGNOSED OR SUSPECTED-A-AH

## 2019-12-15 NOTE — Telephone Encounter (Signed)
Please call her and tell her she can call [phone] and ask for a retest  of Covid due to symptoms. Or she can go to urgent care/ [facility] in [place] to be seen and possibly get tested.

## 2019-12-16 LAB — SARS-COV-2, NAA 2 DAY TAT

## 2019-12-16 LAB — NOVEL CORONAVIRUS, NAA: SARS-CoV-2, NAA: NOT DETECTED

## 2020-01-12 DIAGNOSIS — F900 Attention-deficit hyperactivity disorder, predominantly inattentive type: Secondary | ICD-10-CM | POA: Diagnosis not present

## 2020-01-12 DIAGNOSIS — Z1389 Encounter for screening for other disorder: Secondary | ICD-10-CM | POA: Diagnosis not present

## 2020-01-12 DIAGNOSIS — F331 Major depressive disorder, recurrent, moderate: Secondary | ICD-10-CM | POA: Diagnosis not present

## 2020-01-12 DIAGNOSIS — Z79899 Other long term (current) drug therapy: Secondary | ICD-10-CM | POA: Diagnosis not present

## 2020-02-26 ENCOUNTER — Encounter: Payer: Self-pay | Admitting: Family Medicine

## 2020-02-26 ENCOUNTER — Ambulatory Visit (INDEPENDENT_AMBULATORY_CARE_PROVIDER_SITE_OTHER): Payer: Medicaid Other | Admitting: Family Medicine

## 2020-02-26 ENCOUNTER — Other Ambulatory Visit: Payer: Self-pay

## 2020-02-26 VITALS — BP 112/62 | HR 64 | Ht 62.5 in | Wt 242.0 lb

## 2020-02-26 DIAGNOSIS — K219 Gastro-esophageal reflux disease without esophagitis: Secondary | ICD-10-CM | POA: Diagnosis not present

## 2020-02-26 DIAGNOSIS — G5603 Carpal tunnel syndrome, bilateral upper limbs: Secondary | ICD-10-CM

## 2020-02-26 MED ORDER — ETODOLAC 500 MG PO TABS: 500.0000 mg | ORAL_TABLET | Freq: Two times a day (BID) | ORAL | 1 refills | Status: DC

## 2020-02-26 MED ORDER — PANTOPRAZOLE SODIUM 40 MG PO TBEC: 40.0000 mg | DELAYED_RELEASE_TABLET | Freq: Every day | ORAL | 3 refills | Status: DC

## 2020-02-26 NOTE — Progress Notes (Signed)
Date:  02/26/2020   Name:  Zoe Hunter   DOB:  [birth year]   MRN:  [id number]   Chief Complaint: Wrist Pain (R) hand is worse- radiates to the hand. Type at work)  Wrist Pain  The pain is present in the right wrist and left wrist. This is a new problem. The current episode started more than 1 month ago. The problem occurs constantly. The problem has been gradually worsening. The quality of the pain is described as aching. The pain is at a severity of 4/10. The pain is moderate. Associated symptoms include numbness and tingling. Pertinent negatives include no fever, joint swelling or limited range of motion. She has tried NSAIDS for the symptoms. The treatment provided mild relief. Family history does not include gout or rheumatoid arthritis.    Lab Results  Component Value Date   CREATININE 0.61 05/08/2016   BUN 11 05/08/2016   NA 138 05/08/2016   K 3.2 (L) 05/08/2016   CL 100 (L) 05/08/2016   CO2 27 05/08/2016   Lab Results  Component Value Date   CHOL 158 12/22/2011   HDL 58 12/22/2011   LDLCALC 90 12/22/2011   TRIG 49 12/22/2011   Lab Results  Component Value Date   TSH 1.27 12/22/2011   Lab Results  Component Value Date   HGBA1C 4.7 12/22/2011   Lab Results  Component Value Date   WBC 12.2 (H) 05/08/2016   HGB 13.2 05/08/2016   HCT 38.7 05/08/2016   MCV 80.2 05/08/2016   PLT 309 05/08/2016   Lab Results  Component Value Date   ALT 15 05/08/2016   AST 24 05/08/2016   ALKPHOS 84 05/08/2016   BILITOT 0.6 05/08/2016     Review of Systems  Constitutional: Negative.  Negative for chills, fatigue, fever and unexpected weight change.  HENT: Negative for congestion, ear discharge, ear pain, rhinorrhea, sinus pressure, sneezing and sore throat.   Eyes: Negative for double vision, photophobia, pain, discharge, redness and itching.  Respiratory: Negative for cough, shortness of breath, wheezing and stridor.   Cardiovascular: Negative for chest pain, palpitations  and leg swelling.  Gastrointestinal: Negative for abdominal pain, blood in stool, constipation, diarrhea, nausea and vomiting.  Endocrine: Negative for cold intolerance, heat intolerance, polydipsia, polyphagia and polyuria.  Genitourinary: Negative for dysuria, flank pain, frequency, hematuria, menstrual problem, pelvic pain, urgency, vaginal bleeding and vaginal discharge.  Musculoskeletal: Negative for arthralgias, back pain and myalgias.  Skin: Negative for rash.  Allergic/Immunologic: Negative for environmental allergies and food allergies.  Neurological: Positive for tingling and numbness. Negative for dizziness, weakness, light-headedness and headaches.  Hematological: Negative for adenopathy. Does not bruise/bleed easily.  Psychiatric/Behavioral: Negative for dysphoric mood. The patient is not nervous/anxious.     Patient Active Problem List   Diagnosis Date Noted  . ADD (attention deficit disorder) 02/19/2015  . Bipolar affective disorder (HCC) 02/19/2015  . Clinical depression 02/19/2015  . Cholelithiasis 01/28/2015    No Known Allergies  Past Surgical History:  Procedure Laterality Date  . CHOLECYSTECTOMY N/A 02/16/2015   Procedure: LAPAROSCOPIC CHOLECYSTECTOMY;  Surgeon: Natale Lay, MD;  Location: [facility] ORS;  Service: General;  Laterality: N/A;  . ESOPHAGOGASTRODUODENOSCOPY (EGD) WITH PROPOFOL N/A 05/05/2016   Procedure: ESOPHAGOGASTRODUODENOSCOPY (EGD) WITH PROPOFOL;  Surgeon: Christena Deem, MD;  Location: [facility] ENDOSCOPY;  Service: Endoscopy;  Laterality: N/A;  . TONSILLECTOMY    . WISDOM TOOTH EXTRACTION      Social History   Tobacco Use  . Smoking status:  Never Smoker  . Smokeless tobacco: Never Used  . Tobacco comment: boyfriend smokes  Vaping Use  . Vaping Use: Some days  Substance Use Topics  . Alcohol use: No  . Drug use: Yes    Types: Marijuana     Medication list has been reviewed and updated.  Current Meds  Medication Sig  .  amphetamine-dextroamphetamine (ADDERALL) 30 MG tablet Take 30 mg by mouth daily. cBC  . famotidine (PEPCID) 20 MG tablet Take 1 tablet (20 mg total) by mouth 2 (two) times daily. (Patient taking differently: Take 20 mg by mouth as needed.)  . olopatadine (PATANOL) 0.1 % ophthalmic solution Place 1 drop into both eyes 2 (two) times daily.    PHQ 2/9 Scores 09/18/2019 07/30/2019 06/27/2019  PHQ - 2 Score 0 0 0  PHQ- 9 Score 0 0 0    GAD 7 : Generalized Anxiety Score 09/18/2019 07/30/2019 06/27/2019  Nervous, Anxious, on Edge 0 0 0  Control/stop worrying 0 0 0  Worry too much - different things 0 0 0  Trouble relaxing 0 0 0  Restless 0 0 0  Easily annoyed or irritable 0 0 1  Afraid - awful might happen 0 0 2  Total GAD 7 Score 0 0 3  Anxiety Difficulty - Not difficult at all Not difficult at all    BP Readings from Last 3 Encounters:  02/26/20 112/62  09/18/19 (!) 104/62  07/30/19 112/78    Physical Exam Vitals and nursing note reviewed.  Constitutional:      Appearance: She is well-developed and well-nourished.  HENT:     Head: Normocephalic.     Right Ear: Tympanic membrane, ear canal and external ear normal.     Left Ear: Tympanic membrane, ear canal and external ear normal.     Nose: Nose normal.     Mouth/Throat:     Mouth: Oropharynx is clear and moist. Mucous membranes are moist.  Eyes:     General: Lids are everted, no foreign bodies appreciated. No scleral icterus.       Left eye: No foreign body or hordeolum.     Extraocular Movements: EOM normal.     Conjunctiva/sclera: Conjunctivae normal.     Right eye: Right conjunctiva is not injected.     Left eye: Left conjunctiva is not injected.     Pupils: Pupils are equal, round, and reactive to light.  Neck:     Thyroid: No thyromegaly.     Vascular: No carotid bruit or JVD.     Trachea: No tracheal deviation.  Cardiovascular:     Rate and Rhythm: Normal rate and regular rhythm.     Pulses: Intact distal pulses.      Heart sounds: Normal heart sounds. No murmur heard. No friction rub. No gallop.   Pulmonary:     Effort: Pulmonary effort is normal. No respiratory distress.     Breath sounds: Normal breath sounds. No wheezing, rhonchi or rales.  Abdominal:     General: Bowel sounds are normal.     Palpations: Abdomen is soft. There is no hepatosplenomegaly or mass.     Tenderness: There is no abdominal tenderness. There is no guarding or rebound.  Musculoskeletal:        General: No edema.     Right wrist: Tenderness present. Decreased range of motion.     Left wrist: Tenderness present. Decreased range of motion.     Cervical back: Normal range of motion and neck supple. No  rigidity or tenderness.     Comments: Positive phelen/tinel  Lymphadenopathy:     Cervical: No cervical adenopathy.  Skin:    General: Skin is warm.     Findings: No bruising, erythema or rash.  Neurological:     Mental Status: She is alert and oriented to person, place, and time.     Cranial Nerves: No cranial nerve deficit.     Deep Tendon Reflexes: Strength normal. Reflexes normal.  Psychiatric:        Mood and Affect: Mood and affect normal. Mood is not anxious or depressed.     Wt Readings from Last 3 Encounters:  02/26/20 242 lb (109.8 kg)  09/18/19 (!) 231 lb (104.8 kg)  07/30/19 228 lb (103.4 kg)    BP 112/62   Pulse 64   Ht 5' 2.5" (1.588 m)   Wt 242 lb (109.8 kg)   LMP 01/30/2020 (Approximate)   BMI 43.56 kg/m   Assessment and Plan: 1. Bilateral carpal tunnel syndrome New onset.  Persistent.  Patient has a job that is repetitive in nature.  Patient has signs and symptoms suggestive of carpal tunnel.  She is positive for Phalen and Tinel's sign.  We will initiate etodolac 500 mg 1 twice a day as well as referral to orthopedic surgery for evaluation and treatment.  Patient is also been suggested to obtain a cock-up splint to be used mostly at night and when the right hand is not in use the left is not as  involved. - etodolac (LODINE) 500 MG tablet; Take 1 tablet (500 mg total) by mouth 2 (two) times daily.  Dispense: 60 tablet; Refill: 1 - Ambulatory referral to Orthopedic Surgery  2. Gastroesophageal reflux disease without esophagitis Chronic.  Partially controlled.  Stable.  Patient is presently on famotidine but is having some breakthrough gastritis-like discomfort.  I will call in some pantoprazole to have any further reduction of acid and have selected the total leg which is supposedly other than Celebrex to be less irritating to the gastric mucosa.  Patient will be calling her GI at [facility] since this is a continued problem after her pregnancy. - pantoprazole (PROTONIX) 40 MG tablet; Take 1 tablet (40 mg total) by mouth daily.  Dispense: 30 tablet; Refill: 3

## 2020-02-26 NOTE — Patient Instructions (Signed)
Carpal Tunnel Syndrome  Carpal tunnel syndrome is a condition that causes pain in your hand and arm. The carpal tunnel is a narrow area located on the palm side of your wrist. Repeated wrist motion or certain diseases may cause swelling within the tunnel. This swelling pinches the main nerve in the wrist (median nerve). What are the causes? This condition may be caused by:  Repeated wrist motions.  Wrist injuries.  Arthritis.  A cyst or tumor in the carpal tunnel.  Fluid buildup during pregnancy. Sometimes the cause of this condition is not known. What increases the risk? The following factors may make you more likely to develop this condition:  Having a job, such as being a butcher or a cashier, that requires you to repeatedly move your wrist in the same motion.  Being a woman.  Having certain conditions, such as: ? Diabetes. ? Obesity. ? An underactive thyroid (hypothyroidism). ? Kidney failure. What are the signs or symptoms? Symptoms of this condition include:  A tingling feeling in your fingers, especially in your thumb, index, and middle fingers.  Tingling or numbness in your hand.  An aching feeling in your entire arm, especially when your wrist and elbow are bent for a long time.  Wrist pain that goes up your arm to your shoulder.  Pain that goes down into your palm or fingers.  A weak feeling in your hands. You may have trouble grabbing and holding items. Your symptoms may feel worse during the night. How is this diagnosed? This condition is diagnosed with a medical history and physical exam. You may also have tests, including:  Electromyogram (EMG). This test measures electrical signals sent by your nerves into the muscles.  Nerve conduction study. This test measures how well electrical signals pass through your nerves.  Imaging tests, such as X-rays, ultrasound, and MRI. These tests check for possible causes of your condition. How is this treated? This  condition may be treated with:  Lifestyle changes. It is important to stop or change the activity that caused your condition.  Doing exercise and activities to strengthen your muscles and bones (physical therapy).  Learning how to use your hand again after diagnosis (occupational therapy).  Medicines for pain and inflammation. This may include medicine that is injected into your wrist.  A wrist splint.  Surgery. Follow these instructions at home: If you have a splint:  Wear the splint as told by your health care provider. Remove it only as told by your health care provider.  Loosen the splint if your fingers tingle, become numb, or turn cold and blue.  Keep the splint clean.  If the splint is not waterproof: ? Do not let it get wet. ? Cover it with a watertight covering when you take a bath or shower. Managing pain, stiffness, and swelling   If directed, put ice on the painful area: ? If you have a removable splint, remove it as told by your health care provider. ? Put ice in a plastic bag. ? Place a towel between your skin and the bag. ? Leave the ice on for 20 minutes, 2-3 times per day. General instructions  Take over-the-counter and prescription medicines only as told by your health care provider.  Rest your wrist from any activity that may be causing your pain. If your condition is work related, talk with your employer about changes that can be made, such as getting a wrist pad to use while typing.  Do any exercises as told   by your health care provider, physical therapist, or occupational therapist.  Keep all follow-up visits as told by your health care provider. This is important. Contact a health care provider if:  You have new symptoms.  Your pain is not controlled with medicines.  Your symptoms get worse. Get help right away if:  You have severe numbness or tingling in your wrist or hand. Summary  Carpal tunnel syndrome is a condition that causes pain in  your hand and arm.  It is usually caused by repeated wrist motions.  Lifestyle changes and medicines are used to treat carpal tunnel syndrome. Surgery may be recommended.  Follow your health care provider's instructions about wearing a splint, resting from activity, keeping follow-up visits, and calling for help. This information is not intended to replace advice given to you by your health care provider. Make sure you discuss any questions you have with your health care provider. Document Revised: 06/22/2017 Document Reviewed: 06/22/2017 Elsevier Patient Education  2020 [employer]  

## 2020-03-07 ENCOUNTER — Other Ambulatory Visit: Payer: Medicaid Other

## 2020-03-07 DIAGNOSIS — Z20822 Contact with and (suspected) exposure to covid-19: Secondary | ICD-10-CM | POA: Diagnosis not present

## 2020-03-09 LAB — NOVEL CORONAVIRUS, NAA: SARS-CoV-2, NAA: DETECTED — AB

## 2020-03-09 LAB — SARS-COV-2, NAA 2 DAY TAT

## 2020-03-17 DIAGNOSIS — G5603 Carpal tunnel syndrome, bilateral upper limbs: Secondary | ICD-10-CM | POA: Diagnosis not present

## 2020-03-17 DIAGNOSIS — G5621 Lesion of ulnar nerve, right upper limb: Secondary | ICD-10-CM | POA: Diagnosis not present

## 2020-03-19 DIAGNOSIS — R109 Unspecified abdominal pain: Secondary | ICD-10-CM | POA: Diagnosis not present

## 2020-03-19 DIAGNOSIS — R110 Nausea: Secondary | ICD-10-CM | POA: Diagnosis not present

## 2020-03-23 ENCOUNTER — Telehealth: Payer: Self-pay

## 2020-03-23 NOTE — Telephone Encounter (Signed)
We saw her for carpal tunnel, in which we sent her to orthopedics. If she is still having issues with this, she needs to call Todd back and see what next step is. If it is something different, we have not seen her for anything else nerve related. Carpal tunnel IS an orthopedic problem

## 2020-03-23 NOTE — Telephone Encounter (Signed)
Copied from CRM [id number]. Topic: Referral - Request for Referral >> Mar 23, 2020  3:47 PM Gwenlyn Fudge wrote: Has patient seen PCP for this complaint? Yes.   *If NO, is insurance requiring patient see PCP for this issue before PCP can refer them? Referral for which specialty: Neurology Preferred provider/office: [facility] Reason for referral: Pt states that she is needing to have a referral so that she can see the neurologist. Please advise.   Fax# [phone]

## 2020-03-23 NOTE — Telephone Encounter (Signed)
Pt called stating she needed a referral put in for neurology. When I asked what it was for, she stated that "orthopedics wanted me to go there." I called and spoke to [facility]'s nurse and she said the referral was put in the same day as her appt/ January 19th. She was to call [facility] office if she had not heard from the appt in a week, which would be tomorrow. The nurse told me to have her call neurology and ask to speak to [facility]. I called pt and had to leave a message on her machine stating I spoke to the nurse and she said give you "the number for neurology and ask to speak to Onalee Hua." I did leave neuro's number on machine [phone].

## 2020-03-24 NOTE — Telephone Encounter (Addendum)
Pt calling back and says when she called neurology, they again told her to call her pcp.  Pt cannot get an appt with them unless her pcp sends the referral due to her insurance.  Referral needs to come from Dr Yetta Barre.  Does pt need to make another appt with Dr Yetta Barre to get another referral to neurology?  Pt states Dr Tawanna Cooler wants her to get a test done, she is not even sure what it is. P. cb  [phone]

## 2020-03-25 ENCOUNTER — Other Ambulatory Visit: Payer: Self-pay

## 2020-03-25 DIAGNOSIS — G5603 Carpal tunnel syndrome, bilateral upper limbs: Secondary | ICD-10-CM

## 2020-03-25 NOTE — Telephone Encounter (Signed)
I placed referral after talking to neuro

## 2020-03-25 NOTE — Progress Notes (Signed)
Put in referral for neuro per [facility]

## 2020-04-05 DIAGNOSIS — F331 Major depressive disorder, recurrent, moderate: Secondary | ICD-10-CM | POA: Diagnosis not present

## 2020-04-13 DIAGNOSIS — R200 Anesthesia of skin: Secondary | ICD-10-CM | POA: Diagnosis not present

## 2020-04-30 DIAGNOSIS — R1013 Epigastric pain: Secondary | ICD-10-CM | POA: Diagnosis not present

## 2020-04-30 DIAGNOSIS — D649 Anemia, unspecified: Secondary | ICD-10-CM | POA: Diagnosis not present

## 2020-04-30 DIAGNOSIS — R748 Abnormal levels of other serum enzymes: Secondary | ICD-10-CM | POA: Diagnosis not present

## 2020-04-30 DIAGNOSIS — D509 Iron deficiency anemia, unspecified: Secondary | ICD-10-CM | POA: Diagnosis not present

## 2020-04-30 DIAGNOSIS — F319 Bipolar disorder, unspecified: Secondary | ICD-10-CM | POA: Diagnosis not present

## 2020-04-30 DIAGNOSIS — Z9049 Acquired absence of other specified parts of digestive tract: Secondary | ICD-10-CM | POA: Diagnosis not present

## 2020-04-30 DIAGNOSIS — R112 Nausea with vomiting, unspecified: Secondary | ICD-10-CM | POA: Diagnosis not present

## 2020-06-28 ENCOUNTER — Encounter: Payer: Self-pay | Admitting: Intensive Care

## 2020-06-28 ENCOUNTER — Other Ambulatory Visit: Payer: Self-pay

## 2020-06-28 ENCOUNTER — Emergency Department
Admission: EM | Admit: 2020-06-28 | Discharge: 2020-06-28 | Disposition: A | Discharge status: Home / Self Care | Payer: Medicaid Other | Attending: Emergency Medicine | Admitting: Emergency Medicine

## 2020-06-28 ENCOUNTER — Ambulatory Visit: Payer: Self-pay | Admitting: *Deleted

## 2020-06-28 DIAGNOSIS — R0789 Other chest pain: Secondary | ICD-10-CM | POA: Diagnosis not present

## 2020-06-28 DIAGNOSIS — R42 Dizziness and giddiness: Secondary | ICD-10-CM | POA: Insufficient documentation

## 2020-06-28 DIAGNOSIS — R55 Syncope and collapse: Secondary | ICD-10-CM

## 2020-06-28 DIAGNOSIS — R61 Generalized hyperhidrosis: Secondary | ICD-10-CM | POA: Diagnosis not present

## 2020-06-28 DIAGNOSIS — F331 Major depressive disorder, recurrent, moderate: Secondary | ICD-10-CM | POA: Diagnosis not present

## 2020-06-28 LAB — URINALYSIS, COMPLETE (UACMP) WITH MICROSCOPIC
Bacteria, UA: NONE SEEN
Bilirubin Urine: NEGATIVE
Glucose, UA: NEGATIVE mg/dL
Ketones, ur: NEGATIVE mg/dL
Leukocytes,Ua: NEGATIVE
Nitrite: NEGATIVE
Protein, ur: NEGATIVE mg/dL
RBC / HPF: >50 RBC/hpf — ABNORMAL HIGH (ref 0–5)
Specific Gravity, Urine: 1.017 (ref 1.005–1.030)
pH: 8 (ref 5.0–8.0)

## 2020-06-28 LAB — URINE DRUG SCREEN, QUALITATIVE (ARMC ONLY)
Amphetamines, Ur Screen: NOT DETECTED
Barbiturates, Ur Screen: NOT DETECTED
Benzodiazepine, Ur Scrn: NOT DETECTED
Cannabinoid 50 Ng, Ur ~~LOC~~: POSITIVE — AB
Cocaine Metabolite,Ur ~~LOC~~: NOT DETECTED
MDMA (Ecstasy)Ur Screen: NOT DETECTED
Methadone Scn, Ur: NOT DETECTED
Opiate, Ur Screen: NOT DETECTED
Phencyclidine (PCP) Ur S: NOT DETECTED
Tricyclic, Ur Screen: NOT DETECTED

## 2020-06-28 LAB — CBC
HCT: 38 % (ref 36.0–46.0)
Hemoglobin: 12.1 g/dL (ref 12.0–15.0)
MCH: 24.9 pg — ABNORMAL LOW (ref 26.0–34.0)
MCHC: 31.8 g/dL (ref 30.0–36.0)
MCV: 78.2 fL — ABNORMAL LOW (ref 80.0–100.0)
Platelets: 366 10*3/uL (ref 150–400)
RBC: 4.86 MIL/uL (ref 3.87–5.11)
RDW: 14.3 % (ref 11.5–15.5)
WBC: 9.3 10*3/uL (ref 4.0–10.5)
nRBC: 0 % (ref 0.0–0.2)

## 2020-06-28 LAB — BASIC METABOLIC PANEL
Anion gap: 9 (ref 5–15)
BUN: 10 mg/dL (ref 6–20)
CO2: 26 mmol/L (ref 22–32)
Calcium: 9.2 mg/dL (ref 8.9–10.3)
Chloride: 104 mmol/L (ref 98–111)
Creatinine, Ser: 0.62 mg/dL (ref 0.44–1.00)
GFR, Estimated: >60 mL/min (ref >60)
Glucose, Bld: 88 mg/dL (ref 70–99)
Potassium: 3.6 mmol/L (ref 3.5–5.1)
Sodium: 139 mmol/L (ref 135–145)

## 2020-06-28 LAB — POC URINE PREG, ED: Preg Test, Ur: NEGATIVE

## 2020-06-28 NOTE — ED Notes (Signed)
Pt has been provided with discharge instructions. Pt denies any questions or concerns at this time. Pt verbalizes understanding for follow up care and d/c.  VSS.  Pt left department with all belongings.  

## 2020-06-28 NOTE — Telephone Encounter (Signed)
1430 cooking lunch and she felt lightheaded and her vision was going in and out; this has happened before; she felt like she was going to pass out; she immediately started convulsing after that; she said her boyfriend said she was down for a 20 miinutes;the pt says she has never had a seizure before; she said she could hear her boyfriend talking but she could not respond for after a couple of minutes; she was very confused after the seizure to go away; she felt better after sleeping 1-1.5 hours; she continues to feel lightheaded;  The pt says she has passed out before but never had a seizure; the pt says she is becoming forgetful in the past couple of weeks; the pt says her boyfriend wanted to call EMS but she told him not to; recommendations made per nurse triage protocol; the pt verbalized understanding but would like to be seen in the office because she does not have anyone to watch her toddler; she is seen by Dr Elizabeth Sauer a t [facility]; spoke with [facility] and she request this info be sent to the office for provider review; they will call the pt back; pt notified and can be contacted [phone]; will route per request.  Reason for Disposition . [1] Fainted > 15 minutes ago AND [2] still feels weak or dizzy  Answer Assessment - Initial Assessment Questions 1. ONSET: "How long were you unconscious?" (minutes) "When did it happen?"     Around 20 min 2. CONTENT: "What happened during period of unconsciousness?" (e.g., seizure activity)      "convulsing" 3. MENTAL STATUS: "Alert and oriented now?" (oriented x 3 = name, month, location)     A & O x 3 4. TRIGGER: "What do you think caused the fainting?" "What were you doing just before you fainted?"  (e.g., exercise, sudden standing up, prolonged standing)     Patient was cooking 5. RECURRENT SYMPTOM: "Have you ever passed out before?" If Yes, ask: "When was the last time?" and "What happened that time?"      Passed out at work 2017; told she was  hot and hungry 6. INJURY: "Did you sustain any injury during the fall?"     no 7. CARDIAC SYMPTOMS: "Have you had any of the following symptoms: chest pain, difficulty breathing, palpitations?"     Can not remember much except  chest got tight; harder to breath 8. NEUROLOGIC SYMPTOMS: "Have you had any of the following symptoms: headache, numbness, vertigo, weakness?"    Light headed 9. GI SYMPTOMS: "Have you had any of the following symptoms: abdominal pain, vomiting, diarrhea, blood in stools?"    Pt has biogastritis; loose stools 10. OTHER SYMPTOMS: "Do you have any other symptoms?"       lightheadedness continues; forgetful of the past couple of weeks 11. PREGNANCY: "Is there any chance you are pregnant?" "When was your last menstrual period?"       Currently mensturating  Protocols used: [facility]

## 2020-06-28 NOTE — ED Triage Notes (Addendum)
Patient reports syncope yesterday witnessed by boyfriend. Patient reports seeing spots, fuzzy vision, and became diaphoretic and then she slumped in kitchen chair and started convulsing per b/f. Boyfriend laid her on the ground. Denies hitting head. Denies pain. Patient reports seeing psychiatrist today. Denies HX seizures

## 2020-06-28 NOTE — Telephone Encounter (Signed)
Go to ER for further eval and possible CT scan of head

## 2020-06-28 NOTE — Telephone Encounter (Signed)
Called and left a message.

## 2020-06-28 NOTE — ED Provider Notes (Signed)
[facility] Emergency Department Provider Note   ____________________________________________   Event Date/Time   First MD Initiated Contact with Patient 06/28/20 1824     (approximate)  I have reviewed the triage vital signs and the nursing notes.   HISTORY  Chief Complaint Loss of Consciousness    HPI Zoe Hunter is a 25 y.o. female with the below stated past medical history presents for an episode of near syncope.  Patient also reports an episode of syncope witnessed yesterday by her boyfriend.  Patient reports 2 episodes of seeing spots, diaphoresis, lightheadedness, and chest tightness.  Per significant other, patient slumped in the kitchen chair and started convulsing for approximately 30 seconds prior to regaining consciousness.  Patient states that she has had multiple episodes of this in the past including last time being 2 years ago but has never been worked up with her primary care physician.  Patient does endorse family history of arrhythmia and both grandparents.  Patient currently denies any vision changes, tinnitus, difficulty speaking, facial droop, sore throat, shortness of breath, abdominal pain, nausea/vomiting/diarrhea, dysuria, or numbness/paresthesias in any extremity         Past Medical History:  Diagnosis Date  . ADHD   . ADHD (attention deficit hyperactivity disorder)   . Anxiety   . Bipolar disorder (HCC)   . Cholelithiasis 01/2015  . Depression   . GERD (gastroesophageal reflux disease)     Patient Active Problem List   Diagnosis Date Noted  . ADD (attention deficit disorder) 02/19/2015  . Bipolar affective disorder (HCC) 02/19/2015  . Clinical depression 02/19/2015  . Cholelithiasis 01/28/2015    Past Surgical History:  Procedure Laterality Date  . CHOLECYSTECTOMY N/A 02/16/2015   Procedure: LAPAROSCOPIC CHOLECYSTECTOMY;  Surgeon: Natale Lay, MD;  Location: [facility] ORS;  Service: General;  Laterality: N/A;  .  ESOPHAGOGASTRODUODENOSCOPY (EGD) WITH PROPOFOL N/A 05/05/2016   Procedure: ESOPHAGOGASTRODUODENOSCOPY (EGD) WITH PROPOFOL;  Surgeon: Christena Deem, MD;  Location: [facility] ENDOSCOPY;  Service: Endoscopy;  Laterality: N/A;  . TONSILLECTOMY    . WISDOM TOOTH EXTRACTION      Prior to Admission medications   Medication Sig Start Date End Date Taking? Authorizing Provider  amphetamine-dextroamphetamine (ADDERALL) 30 MG tablet Take 30 mg by mouth daily. cBC    [provider]  etodolac (LODINE) 500 MG tablet Take 1 tablet (500 mg total) by mouth 2 (two) times daily. 02/26/20   [name], MD  famotidine (PEPCID) 20 MG tablet Take 1 tablet (20 mg total) by mouth 2 (two) times daily. Patient taking differently: Take 20 mg by mouth as needed. 06/27/19   [name], MD  olopatadine (PATANOL) 0.1 % ophthalmic solution Place 1 drop into both eyes 2 (two) times daily. 08/01/19   [name], MD  pantoprazole (PROTONIX) 40 MG tablet Take 1 tablet (40 mg total) by mouth daily. 02/26/20   [name], MD    Allergies Patient has no known allergies.  Family History  Problem Relation Age of Onset  . Hypertension Mother   . Cancer Mother   . Hypertension Maternal Grandmother   . Cancer Other   . Hypertension Maternal Grandfather   . Heart disease Maternal Grandfather     Social History Social History   Tobacco Use  . Smoking status: Never Smoker  . Smokeless tobacco: Never Used  . Tobacco comment: boyfriend smokes  Vaping Use  . Vaping Use: Some days  Substance Use Topics  . Alcohol use:  No  . Drug use: Yes    Types: Marijuana    Review of Systems Constitutional: No fever/chills Eyes: No visual changes. ENT: No sore throat. Cardiovascular: Endorses chest pain. Respiratory: Denies shortness of breath. Gastrointestinal: No abdominal pain.  No nausea, no vomiting.  No diarrhea. Genitourinary: Negative for dysuria. Musculoskeletal: Negative for acute  arthralgias Skin: Negative for rash. Neurological: Negative for headaches, weakness/numbness/paresthesias in any extremity Psychiatric: Negative for suicidal ideation/homicidal ideation   ____________________________________________   PHYSICAL EXAM:  VITAL SIGNS: ED Triage Vitals [06/28/20 1655]  Enc Vitals Group     BP (!) 125/93     Pulse Rate 81     Resp 16     Temp 99.2 F (37.3 C)     Temp Source Oral     SpO2 98 %     Weight 240 lb (108.9 kg)     Height 5\' 3"  (1.6 m)     Head Circumference      Peak Flow      Pain Score 0     Pain Loc      Pain Edu?      Excl. in GC?    Constitutional: Alert and oriented. Well appearing obese 25 year old Caucasian female in no acute distress. Eyes: Conjunctivae are normal. PERRL. Head: Atraumatic. Nose: No congestion/rhinnorhea. Mouth/Throat: Mucous membranes are moist. Neck: No stridor Cardiovascular: Grossly normal heart sounds.  Good peripheral circulation. Respiratory: Normal respiratory effort.  No retractions. Gastrointestinal: Soft and nontender. No distention. Musculoskeletal: No obvious deformities Neurologic:  Normal speech and language. No gross focal neurologic deficits are appreciated. Skin:  Skin is warm and dry. No rash noted. Psychiatric: Mood and affect are normal. Speech and behavior are normal.  ____________________________________________   LABS (all labs ordered are listed, but only abnormal results are displayed)  Labs Reviewed  CBC - Abnormal; Notable for the following components:      Result Value   MCV 78.2 (*)    MCH 24.9 (*)    All other components within normal limits  URINALYSIS, COMPLETE (UACMP) WITH MICROSCOPIC - Abnormal; Notable for the following components:   Color, Urine YELLOW (*)    APPearance HAZY (*)    Hgb urine dipstick LARGE (*)    RBC / HPF >50 (*)    All other components within normal limits  URINE DRUG SCREEN, QUALITATIVE ([facility] ONLY) - Abnormal; Notable for the following  components:   Cannabinoid 50 Ng, Ur [place] POSITIVE (*)    All other components within normal limits  BASIC METABOLIC PANEL  POC URINE PREG, ED   ____________________________________________  EKG  ED ECG REPORT I, 25, the attending physician, personally viewed and interpreted this ECG.  Date: 06/28/2020 EKG Time: 1700 Rate: 61 Rhythm: normal sinus rhythm QRS Axis: normal Intervals: normal ST/T Wave abnormalities: normal Narrative Interpretation: no evidence of acute ischemia  PROCEDURES  Procedure(s) performed (including Critical Care):  .1-3 Lead EKG Interpretation Performed by: 08/28/2020, MD Authorized by: Merwyn Katos, MD     Interpretation: normal     ECG rate:  64   ECG rate assessment: normal     Rhythm: sinus rhythm     Ectopy: none     Conduction: normal       ____________________________________________   INITIAL IMPRESSION / ASSESSMENT AND PLAN / ED COURSE  As part of my medical decision making, I reviewed the following data within the electronic MEDICAL RECORD NUMBER Nursing notes reviewed and incorporated, Labs reviewed, EKG  interpreted, Old chart reviewed, Radiograph reviewed and Notes from prior ED visits reviewed and incorporated        Patient presents with complaints of syncope/presyncope ED Workup:  CBC, BMP, Troponin, BNP, ECG, CXR Differential diagnosis includes HF, ICH, seizure, stroke, HOCM, ACS, aortic dissection, malignant arrhythmia, or GI bleed. Findings: No evidence of acute laboratory abnormalities.  Troponin negative x1 EKG: No e/o STEMI. No evidence of Brugadas sign, delta wave, epsilon wave, significantly prolonged QTc, or malignant arrhythmia.  Disposition: Discharge. Patient is at baseline at this time. Return precautions expressed and understood in person. Advised follow up with primary care provider or clinic physician in next 24 hours.      ____________________________________________   FINAL CLINICAL  IMPRESSION(S) / ED DIAGNOSES  Final diagnoses:  Chest tightness  Near syncope     ED Discharge Orders    None       Note:  This document was prepared using Dragon voice recognition software and may include unintentional dictation errors.   Merwyn Katos, MD 06/28/20 2102

## 2020-06-29 ENCOUNTER — Telehealth: Payer: Self-pay

## 2020-06-29 NOTE — Telephone Encounter (Signed)
Copied from CRM [id number]. Topic: General - Other >> Jun 29, 2020  1:45 PM Jaquita Rector A wrote: Reason for CRM: Patient called in to inquire of Dr Yetta Barre on her going to the ER on 06/28/20 asking if it would be ok to go to work or does she wait until after she see the Cardiologist on 07/01/20. Please call with an answer before 4 PM at  Ph# [phone]

## 2020-06-29 NOTE — Telephone Encounter (Signed)
Pt instructed to call ER because she has stated the "doctor told me not to drive" while I am feeling this way. She wanted to know if she needed to stay out of work since she is still feeling dizzy. I advised her to call ER and ask them if she needs to still stay out of work or not drive. If they take her out of work, she will need a note from the ER. We did not do the work-up for this.

## 2020-06-29 NOTE — Telephone Encounter (Signed)
This would be up to the doctor that saw her in the ER. If they did not put her out of work, then they must have felt like she is ok to go to work

## 2020-07-01 DIAGNOSIS — R0789 Other chest pain: Secondary | ICD-10-CM | POA: Diagnosis not present

## 2020-07-01 DIAGNOSIS — R569 Unspecified convulsions: Secondary | ICD-10-CM | POA: Diagnosis not present

## 2020-07-01 DIAGNOSIS — R55 Syncope and collapse: Secondary | ICD-10-CM | POA: Diagnosis not present

## 2020-07-01 DIAGNOSIS — R0602 Shortness of breath: Secondary | ICD-10-CM | POA: Diagnosis not present

## 2020-07-06 DIAGNOSIS — Z79899 Other long term (current) drug therapy: Secondary | ICD-10-CM | POA: Diagnosis not present

## 2020-07-06 DIAGNOSIS — R0789 Other chest pain: Secondary | ICD-10-CM | POA: Diagnosis not present

## 2020-07-06 DIAGNOSIS — R0602 Shortness of breath: Secondary | ICD-10-CM | POA: Diagnosis not present

## 2020-07-06 DIAGNOSIS — Z9049 Acquired absence of other specified parts of digestive tract: Secondary | ICD-10-CM | POA: Diagnosis not present

## 2020-07-06 DIAGNOSIS — Z28310 Unvaccinated for covid-19: Secondary | ICD-10-CM | POA: Diagnosis not present

## 2020-07-06 DIAGNOSIS — R918 Other nonspecific abnormal finding of lung field: Secondary | ICD-10-CM | POA: Diagnosis not present

## 2020-07-06 DIAGNOSIS — R7989 Other specified abnormal findings of blood chemistry: Secondary | ICD-10-CM | POA: Diagnosis not present

## 2020-07-06 DIAGNOSIS — R55 Syncope and collapse: Secondary | ICD-10-CM | POA: Diagnosis not present

## 2020-07-06 DIAGNOSIS — Z20822 Contact with and (suspected) exposure to covid-19: Secondary | ICD-10-CM | POA: Diagnosis not present

## 2020-07-07 DIAGNOSIS — R7989 Other specified abnormal findings of blood chemistry: Secondary | ICD-10-CM | POA: Diagnosis not present

## 2020-07-08 ENCOUNTER — Telehealth: Payer: Self-pay

## 2020-07-08 NOTE — Telephone Encounter (Signed)
Called her again and scheduled her for 07/13/20 @ 9:00, be there at [address] [facility] with Dr Sherryll Burger- pt notified

## 2020-07-08 NOTE — Telephone Encounter (Signed)
Called and left message for pt- she needs to see neurology before seeing Dr Yetta Barre. I looked in her chart and from what I can tell, she doesn't see them until June or July- Dr Sherryll Burger. I left the number to neurology on her phone and suggested if she hasn't seen them, to try and call them to get appt moved up. If she isn't successful, she can call here and I will try to call. Also, stated she should not be driving until evaluated by neurology

## 2020-07-09 ENCOUNTER — Ambulatory Visit: Payer: Medicaid Other | Admitting: Family Medicine

## 2020-07-13 ENCOUNTER — Telehealth: Payer: Self-pay

## 2020-07-13 DIAGNOSIS — E612 Magnesium deficiency: Secondary | ICD-10-CM | POA: Diagnosis not present

## 2020-07-13 DIAGNOSIS — E538 Deficiency of other specified B group vitamins: Secondary | ICD-10-CM | POA: Diagnosis not present

## 2020-07-13 DIAGNOSIS — F909 Attention-deficit hyperactivity disorder, unspecified type: Secondary | ICD-10-CM | POA: Diagnosis not present

## 2020-07-13 DIAGNOSIS — R404 Transient alteration of awareness: Secondary | ICD-10-CM | POA: Diagnosis not present

## 2020-07-13 DIAGNOSIS — F32A Depression, unspecified: Secondary | ICD-10-CM | POA: Diagnosis not present

## 2020-07-13 DIAGNOSIS — E559 Vitamin D deficiency, unspecified: Secondary | ICD-10-CM | POA: Diagnosis not present

## 2020-07-13 NOTE — Telephone Encounter (Signed)
I called pt and reminded her of her appt with Dr Sherryll Burger this morning at 9:00. I told her to be there at [phone]. She said her gpa is taking her and she verified where to go.

## 2020-07-19 ENCOUNTER — Other Ambulatory Visit: Payer: Self-pay | Admitting: Neurology

## 2020-07-19 ENCOUNTER — Other Ambulatory Visit (HOSPITAL_COMMUNITY): Payer: Self-pay | Admitting: Neurology

## 2020-07-19 DIAGNOSIS — R404 Transient alteration of awareness: Secondary | ICD-10-CM

## 2020-07-20 DIAGNOSIS — Z20822 Contact with and (suspected) exposure to covid-19: Secondary | ICD-10-CM | POA: Diagnosis not present

## 2020-07-23 ENCOUNTER — Ambulatory Visit
Admission: RE | Admit: 2020-07-23 | Discharge: 2020-07-23 | Disposition: A | Discharge status: Home / Self Care | Payer: Medicaid Other | Source: Ambulatory Visit | Attending: Neurology | Admitting: Neurology

## 2020-07-23 ENCOUNTER — Other Ambulatory Visit: Payer: Self-pay

## 2020-07-23 DIAGNOSIS — R404 Transient alteration of awareness: Secondary | ICD-10-CM | POA: Insufficient documentation

## 2020-07-23 DIAGNOSIS — J019 Acute sinusitis, unspecified: Secondary | ICD-10-CM | POA: Diagnosis not present

## 2020-07-23 MED ORDER — GADOBUTROL 1 MMOL/ML IV SOLN
10.0000 mL | Freq: Once | INTRAVENOUS | Status: AC | PRN
  Administered 2020-07-23: 10 mL via INTRAVENOUS

## 2020-08-15 DIAGNOSIS — R569 Unspecified convulsions: Secondary | ICD-10-CM | POA: Diagnosis not present

## 2020-08-17 DIAGNOSIS — R569 Unspecified convulsions: Secondary | ICD-10-CM | POA: Diagnosis not present

## 2020-08-17 DIAGNOSIS — R55 Syncope and collapse: Secondary | ICD-10-CM | POA: Insufficient documentation

## 2020-08-17 HISTORY — DX: Syncope and collapse: R55

## 2020-08-18 DIAGNOSIS — R0602 Shortness of breath: Secondary | ICD-10-CM | POA: Diagnosis not present

## 2020-08-18 DIAGNOSIS — R55 Syncope and collapse: Secondary | ICD-10-CM | POA: Diagnosis not present

## 2020-08-24 DIAGNOSIS — R55 Syncope and collapse: Secondary | ICD-10-CM | POA: Diagnosis not present

## 2020-08-31 DIAGNOSIS — R55 Syncope and collapse: Secondary | ICD-10-CM | POA: Diagnosis not present

## 2020-09-13 DIAGNOSIS — R404 Transient alteration of awareness: Secondary | ICD-10-CM | POA: Diagnosis not present

## 2020-09-22 DIAGNOSIS — F331 Major depressive disorder, recurrent, moderate: Secondary | ICD-10-CM | POA: Diagnosis not present

## 2020-09-30 DIAGNOSIS — Z124 Encounter for screening for malignant neoplasm of cervix: Secondary | ICD-10-CM | POA: Diagnosis not present

## 2020-09-30 DIAGNOSIS — F3177 Bipolar disorder, in partial remission, most recent episode mixed: Secondary | ICD-10-CM | POA: Diagnosis not present

## 2020-10-01 ENCOUNTER — Ambulatory Visit (INDEPENDENT_AMBULATORY_CARE_PROVIDER_SITE_OTHER): Payer: Medicaid Other

## 2020-10-01 ENCOUNTER — Other Ambulatory Visit: Payer: Self-pay

## 2020-10-01 ENCOUNTER — Ambulatory Visit
Admission: EM | Admit: 2020-10-01 | Discharge: 2020-10-01 | Disposition: A | Discharge status: Home / Self Care | Payer: Medicaid Other

## 2020-10-01 DIAGNOSIS — I959 Hypotension, unspecified: Secondary | ICD-10-CM | POA: Diagnosis not present

## 2020-10-01 DIAGNOSIS — R059 Cough, unspecified: Secondary | ICD-10-CM

## 2020-10-01 DIAGNOSIS — R071 Chest pain on breathing: Secondary | ICD-10-CM

## 2020-10-01 DIAGNOSIS — R0781 Pleurodynia: Secondary | ICD-10-CM | POA: Diagnosis not present

## 2020-10-01 DIAGNOSIS — R0789 Other chest pain: Secondary | ICD-10-CM | POA: Diagnosis not present

## 2020-10-01 MED ORDER — INDOMETHACIN 50 MG PO CAPS: 50.0000 mg | ORAL_CAPSULE | Freq: Three times a day (TID) | ORAL | 0 refills | Status: AC | PRN

## 2020-10-01 NOTE — ED Triage Notes (Signed)
Pt c/o chest tightness when taking a deep breath. Pt states she started coughing last night and then felt the tightness start. She reports the cough has subsided. Pt did not want to seek evaluation in ED last night. Pt denies any other symptoms.

## 2020-10-01 NOTE — ED Provider Notes (Signed)
[facility]    CSN: [id number] Arrival date & time: 10/01/20  1105      History   Chief Complaint Chief Complaint  Patient presents with   Cough   Shortness of Breath    HPI Zoe Hunter is a 25 y.o. female presenting for concerns about pleuritic chest pain.  Patient states that she was getting ready for bed last night and suddenly had a coughing fit that lasted for 2 hours.  States that she started having pain in her chest when she was coughing and pain on breathing while she was coughing.  Also admits to SOB last night but not today. Patient says she has not coughed any today but she still feels the chest pain when she takes a deep breath.  She denies any fevers, fatigue, achiness or congestion.  Patient denies any COVID exposure.  Not vaccinated for COVID-19.  She denies any leg swelling or pain.  No history of recent travel.  Patient does not take oral contraceptives.  She denies any history of blood clotting disorders.  No history of DVT/PE.  No history of cardiopulmonary disease.  No history of autoimmune disease.  She does have a history of bipolar disorder, anxiety, depression, syncope, seizure like activity, and GERD.  Patient says she feels a little better than yesterday but is still concerned about the pain on breathing. She denies smoking. No other complaints.  HPI  Past Medical History:  Diagnosis Date   ADHD    ADHD (attention deficit hyperactivity disorder)    Anxiety    Bipolar disorder (HCC)    Cholelithiasis 01/2015   Depression    GERD (gastroesophageal reflux disease)     Patient Active Problem List   Diagnosis Date Noted   ADD (attention deficit disorder) 02/19/2015   Bipolar affective disorder (HCC) 02/19/2015   Clinical depression 02/19/2015   Cholelithiasis 01/28/2015    Past Surgical History:  Procedure Laterality Date   CHOLECYSTECTOMY N/A 02/16/2015   Procedure: LAPAROSCOPIC CHOLECYSTECTOMY;  Surgeon: Natale Lay, MD;  Location: [facility]  ORS;  Service: General;  Laterality: N/A;   ESOPHAGOGASTRODUODENOSCOPY (EGD) WITH PROPOFOL N/A 05/05/2016   Procedure: ESOPHAGOGASTRODUODENOSCOPY (EGD) WITH PROPOFOL;  Surgeon: Christena Deem, MD;  Location: [facility] ENDOSCOPY;  Service: Endoscopy;  Laterality: N/A;   TONSILLECTOMY     WISDOM TOOTH EXTRACTION      OB History   No obstetric history on file.      Home Medications    Prior to Admission medications   Medication Sig Start Date End Date Taking? Authorizing Provider  amphetamine-dextroamphetamine (ADDERALL) 30 MG tablet Take 30 mg by mouth daily. cBC   Yes [provider]  ergocalciferol (VITAMIN D2) 1.25 MG (50000 UT) capsule Take 1 capsule by mouth once a week. 07/29/20  Yes [provider]  etodolac (LODINE) 500 MG tablet Take 1 tablet (500 mg total) by mouth 2 (two) times daily. 02/26/20  Yes [name], MD  famotidine (PEPCID) 20 MG tablet Take 1 tablet (20 mg total) by mouth 2 (two) times daily. Patient taking differently: Take 20 mg by mouth as needed. 06/27/19  Yes [name], MD  indomethacin (INDOCIN) 50 MG capsule Take 1 capsule (50 mg total) by mouth 3 (three) times daily as needed for up to 7 days for moderate pain. 10/01/20 10/08/20 Yes Eusebio Friendly B, PA-C  olopatadine (PATANOL) 0.1 % ophthalmic solution Place 1 drop into both eyes 2 (two) times daily. 08/01/19  Yes [name],  MD  pantoprazole (PROTONIX) 40 MG tablet Take 1 tablet (40 mg total) by mouth daily. 02/26/20  Yes [name], MD    Family History Family History  Problem Relation Age of Onset   Hypertension Mother    Cancer Mother    Hypertension Maternal Grandmother    Cancer Other    Hypertension Maternal Grandfather    Heart disease Maternal Grandfather     Social History Social History   Tobacco Use   Smoking status: Never   Smokeless tobacco: Never   Tobacco comments:    boyfriend smokes  Vaping Use   Vaping Use: Some days  Substance Use Topics    Alcohol use: No   Drug use: Yes    Types: Marijuana     Allergies   Patient has no known allergies.   Review of Systems Review of Systems  Constitutional:  Negative for chills, diaphoresis, fatigue and fever.  HENT:  Negative for congestion, ear pain, rhinorrhea, sinus pressure, sinus pain and sore throat.   Respiratory:  Positive for chest tightness. Negative for cough and shortness of breath.   Cardiovascular:  Positive for chest pain (on breathing).  Gastrointestinal:  Negative for abdominal pain, nausea and vomiting.  Musculoskeletal:  Negative for arthralgias and myalgias.  Skin:  Negative for rash.  Neurological:  Negative for weakness and headaches.  Hematological:  Negative for adenopathy.    Physical Exam Triage Vital Signs ED Triage Vitals  Enc Vitals Group     BP 10/01/20 1139 (!) 103/55     Pulse Rate 10/01/20 1139 61     Resp 10/01/20 1139 18     Temp 10/01/20 1139 98.2 F (36.8 C)     Temp Source 10/01/20 1139 Oral     SpO2 10/01/20 1139 100 %     Weight 10/01/20 1137 230 lb (104.3 kg)     Height 10/01/20 1137 5\' 3"  (1.6 m)     Head Circumference --      Peak Flow --      Pain Score 10/01/20 1137 1     Pain Loc --      Pain Edu? --      Excl. in GC? --    No data found.  Updated Vital Signs BP (!) 97/46 (BP Location: Left Arm)   Pulse 61   Temp 98.2 F (36.8 C) (Oral)   Resp 18   Ht 5\' 3"  (1.6 m)   Wt 230 lb (104.3 kg)   LMP 09/18/2020   SpO2 100%   BMI 40.74 kg/m      Physical Exam Vitals and nursing note reviewed.  Constitutional:      General: She is not in acute distress.    Appearance: Normal appearance. She is not ill-appearing or toxic-appearing.  HENT:     Head: Normocephalic and atraumatic.     Nose: Nose normal.     Mouth/Throat:     Mouth: Mucous membranes are dry.     Pharynx: Oropharynx is clear.  Eyes:     General: No scleral icterus.       Right eye: No discharge.        Left eye: No discharge.      Conjunctiva/sclera: Conjunctivae normal.  Cardiovascular:     Rate and Rhythm: Normal rate and regular rhythm.     Heart sounds: Normal heart sounds.  Pulmonary:     Effort: Pulmonary effort is normal. No respiratory distress.     Breath sounds: Normal breath  sounds. No wheezing, rhonchi or rales.  Musculoskeletal:     Cervical back: Neck supple.     Right lower leg: No edema.     Left lower leg: No edema.  Skin:    General: Skin is dry.  Neurological:     General: No focal deficit present.     Mental Status: She is alert. Mental status is at baseline.     Motor: No weakness.     Gait: Gait normal.  Psychiatric:        Mood and Affect: Mood normal.        Behavior: Behavior normal.        Thought Content: Thought content normal.     UC Treatments / Results  Labs (all labs ordered are listed, but only abnormal results are displayed) Labs Reviewed - No data to display  EKG   Radiology DG Chest 2 View  Result Date: 10/01/2020 CLINICAL DATA:  chest tightness, pain with deep breathing EXAM: CHEST - 2 VIEW COMPARISON:  The heart size and mediastinal contours are within normal limits.No focal airspace disease. No pleural effusion or pneumothorax.No acute osseous abnormality. FINDINGS: The heart size and mediastinal contours are within normal limits. Both lungs are clear. The visualized skeletal structures are unremarkable. IMPRESSION: No evidence of acute cardiopulmonary disease. Electronically Signed   By: Caprice Renshaw   On: 10/01/2020 12:32    Procedures ED EKG  Date/Time: 10/01/2020 12:38 PM Performed by: Shirlee Latch, PA-C Authorized by: Shirlee Latch, PA-C   ECG reviewed by ED Physician in the absence of a cardiologist: yes   Previous ECG:    Previous ECG:  Unavailable Interpretation:    Interpretation: normal   Rate:    ECG rate assessment: normal   Rhythm:    Rhythm: sinus rhythm   Ectopy:    Ectopy: none   QRS:    QRS axis:  Normal   QRS intervals:   Normal   QRS conduction: normal   ST segments:    ST segments:  Normal T waves:    T waves: normal   Comments:     NSR, regular rate at 61 bpm (including critical care time)  Medications Ordered in UC Medications - No data to display  Initial Impression / Assessment and Plan / UC Course  I have reviewed the triage vital signs and the nursing notes.  Pertinent labs & imaging results that were available during my care of the patient were reviewed by me and considered in my medical decision making (see chart for details).  25 year old female presenting for pleuritic chest pain since last night.  Pain started with excessive coughing fit.  Denies choking at that time.  Patient currently denies any shortness of breath or weakness.  No fevers.  Cough is no longer present.  No COVID exposure and declines COVID testing.  BP a little low at 103/55.  The remainder vital signs are stable.  Patient is overall well-appearing.  Exam is benign.  Chest is clear to auscultation heart regular rate and rhythm.  No leg swelling or calf tenderness.  Chest x-ray obtained today as well as EKG.  Normal EKG and chest x-ray.  Reviewed this with patient.  I did advise patient that her blood pressure was low at 103/55.  Recheck is 97/46.  Patient says that she does have low blood pressure but she is never seen at this low.  I did offer to perform lab work-up and give her IV fluids but  she declines at this time.  States she would like to go home and eat something and push fluids.  Advised patient that if she is feeling weak or not improving she may need to go to the emergency department for evaluation.  Treating patient's pleuritic pain at this time with indomethacin.  Thoroughly reviewed ED red flag signs and symptoms related to chest pain and hypotension.   Final Clinical Impressions(s) / UC Diagnoses   Final diagnoses:  Pleuritic pain  Cough     Discharge Instructions      Your EKG is normal and so is  your chest x-ray. You likely are having chest pain/tightness secondary to the forceful coughing you did last night. I have sent an anti-inflammatory medication. Increase rest and fluids. If you have more coughing, take Dayquil/Nyquil. Call 911 or go to ED if you have worsening pain, trouble breathing, racing heart, feel faint or pass out.  Consider COVID testing if you find out you have been exposed, have worsening cough, or fever     ED Prescriptions     Medication Sig Dispense Auth. Provider   indomethacin (INDOCIN) 50 MG capsule Take 1 capsule (50 mg total) by mouth 3 (three) times daily as needed for up to 7 days for moderate pain. 21 capsule Shirlee LatchEaves, Valentina Alcoser B, PA-C      PDMP not reviewed this encounter.   Shirlee Latchaves, Danile Trier B, PA-C 10/01/20 1313

## 2020-10-01 NOTE — Discharge Instructions (Addendum)
Your EKG is normal and so is your chest x-ray. You likely are having chest pain/tightness secondary to the forceful coughing you did last night. I have sent an anti-inflammatory medication. Increase rest and fluids. If you have more coughing, take Dayquil/Nyquil. Call 911 or go to ED if you have worsening pain, trouble breathing, racing heart, feel faint or pass out.  Consider COVID testing if you find out you have been exposed, have worsening cough, or fever

## 2020-10-28 DIAGNOSIS — R569 Unspecified convulsions: Secondary | ICD-10-CM | POA: Diagnosis not present

## 2020-11-08 ENCOUNTER — Telehealth: Payer: Self-pay

## 2020-11-08 DIAGNOSIS — R112 Nausea with vomiting, unspecified: Secondary | ICD-10-CM | POA: Diagnosis not present

## 2020-11-08 NOTE — Telephone Encounter (Signed)
Copied from CRM [id number]. Topic: General - Other >> Nov 08, 2020  1:39 PM Jaquita Rector A wrote: Reason for CRM: Patient  called in to inform Dr Yetta Barre that she will be needing an extension on her FMLA paperwork and needing that done ASAP and faxed to Fax# [phone] any question please call Ph# [phone]

## 2020-11-08 NOTE — Telephone Encounter (Signed)
Called pt and explained to her we can't keep an open FMLA for her to call out whenever she isn't feeling well. This will have to be done by a specialist

## 2020-11-17 DIAGNOSIS — K30 Functional dyspepsia: Secondary | ICD-10-CM | POA: Diagnosis not present

## 2020-11-17 DIAGNOSIS — R112 Nausea with vomiting, unspecified: Secondary | ICD-10-CM | POA: Diagnosis not present

## 2020-11-30 DIAGNOSIS — R55 Syncope and collapse: Secondary | ICD-10-CM | POA: Diagnosis not present

## 2020-12-22 DIAGNOSIS — F331 Major depressive disorder, recurrent, moderate: Secondary | ICD-10-CM | POA: Diagnosis not present

## 2021-01-12 DIAGNOSIS — R079 Chest pain, unspecified: Secondary | ICD-10-CM | POA: Diagnosis not present

## 2021-01-12 DIAGNOSIS — R55 Syncope and collapse: Secondary | ICD-10-CM | POA: Diagnosis not present

## 2021-01-12 DIAGNOSIS — R569 Unspecified convulsions: Secondary | ICD-10-CM | POA: Diagnosis not present

## 2021-01-12 DIAGNOSIS — R0602 Shortness of breath: Secondary | ICD-10-CM | POA: Diagnosis not present

## 2021-01-14 ENCOUNTER — Telehealth: Payer: Self-pay | Admitting: Family Medicine

## 2021-01-14 ENCOUNTER — Other Ambulatory Visit: Payer: Self-pay

## 2021-01-14 ENCOUNTER — Encounter: Payer: Self-pay | Admitting: Family Medicine

## 2021-01-14 ENCOUNTER — Ambulatory Visit (INDEPENDENT_AMBULATORY_CARE_PROVIDER_SITE_OTHER): Payer: Medicaid Other | Admitting: Family Medicine

## 2021-01-14 VITALS — BP 104/80 | HR 85 | Ht 63.0 in | Wt 240.0 lb

## 2021-01-14 DIAGNOSIS — H00022 Hordeolum internum right lower eyelid: Secondary | ICD-10-CM

## 2021-01-14 DIAGNOSIS — H1031 Unspecified acute conjunctivitis, right eye: Secondary | ICD-10-CM | POA: Diagnosis not present

## 2021-01-14 DIAGNOSIS — J0100 Acute maxillary sinusitis, unspecified: Secondary | ICD-10-CM | POA: Diagnosis not present

## 2021-01-14 MED ORDER — CEPHALEXIN 500 MG PO CAPS
500.0000 mg | ORAL_CAPSULE | Freq: Two times a day (BID) | ORAL | 1 refills | Status: DC
Start: 2021-01-14 — End: 2021-01-17

## 2021-01-14 MED ORDER — TOBRAMYCIN-DEXAMETHASONE 0.3-0.1 % OP SUSP: 1.0000 [drp] | OPHTHALMIC | 0 refills | Status: DC

## 2021-01-14 MED ORDER — NEOMYCIN-POLYMYXIN-HC 3.5-10000-1 OP SUSP: 3.0000 [drp] | Freq: Four times a day (QID) | OPHTHALMIC | 0 refills | Status: DC

## 2021-01-14 NOTE — Telephone Encounter (Signed)
Sent in Cortisporin instead.

## 2021-01-14 NOTE — Telephone Encounter (Signed)
Pt calling back, stated [facility] still has not received anything, please advise.

## 2021-01-14 NOTE — Progress Notes (Addendum)
Date:  01/14/2021   Name:  Zoe Hunter   DOB:  1995/03/12   MRN:  [id number]   Chief Complaint: No chief complaint on file.  Conjunctivitis  The current episode started today (history of styes). The onset was sudden. The problem has been gradually worsening. The problem is mild. Relieved by: warm compresses. The symptoms are aggravated by movement. Associated symptoms include eye redness. Pertinent negatives include no fever, no decreased vision, no double vision, no abdominal pain, no constipation, no diarrhea, no nausea, no ear discharge, no ear pain, no headaches, no sore throat, no neck pain, no cough, no wheezing, no rash and no eye discharge.   Lab Results  Component Value Date   CREATININE 0.62 06/28/2020   BUN 10 06/28/2020   NA 139 06/28/2020   K 3.6 06/28/2020   CL 104 06/28/2020   CO2 26 06/28/2020   Lab Results  Component Value Date   CHOL 158 12/22/2011   HDL 58 12/22/2011   LDLCALC 90 12/22/2011   TRIG 49 12/22/2011   Lab Results  Component Value Date   TSH 1.27 12/22/2011   Lab Results  Component Value Date   HGBA1C 4.7 12/22/2011   Lab Results  Component Value Date   WBC 9.3 06/28/2020   HGB 12.1 06/28/2020   HCT 38.0 06/28/2020   MCV 78.2 (L) 06/28/2020   PLT 366 06/28/2020   Lab Results  Component Value Date   ALT 15 05/08/2016   AST 24 05/08/2016   ALKPHOS 84 05/08/2016   BILITOT 0.6 05/08/2016   No components found for: VITD  Review of Systems  Constitutional:  Negative for chills and fever.  HENT:  Negative for drooling, ear discharge, ear pain and sore throat.   Eyes:  Positive for redness. Negative for double vision and discharge.  Respiratory:  Negative for cough, shortness of breath and wheezing.   Cardiovascular:  Negative for chest pain, palpitations and leg swelling.  Gastrointestinal:  Negative for abdominal pain, blood in stool, constipation, diarrhea and nausea.  Endocrine: Negative for polydipsia.  Genitourinary:   Negative for dysuria, frequency, hematuria and urgency.  Musculoskeletal:  Negative for back pain, myalgias and neck pain.  Skin:  Negative for rash.  Allergic/Immunologic: Negative for environmental allergies.  Neurological:  Negative for dizziness and headaches.  Hematological:  Does not bruise/bleed easily.  Psychiatric/Behavioral:  Negative for suicidal ideas. The patient is not nervous/anxious.    Patient Active Problem List   Diagnosis Date Noted   ADD (attention deficit disorder) 02/19/2015   Bipolar affective disorder (HCC) 02/19/2015   Clinical depression 02/19/2015   Cholelithiasis 01/28/2015    No Known Allergies  Past Surgical History:  Procedure Laterality Date   CHOLECYSTECTOMY N/A 02/16/2015   Procedure: LAPAROSCOPIC CHOLECYSTECTOMY;  Surgeon: Natale Lay, MD;  Location: [facility] ORS;  Service: General;  Laterality: N/A;   ESOPHAGOGASTRODUODENOSCOPY (EGD) WITH PROPOFOL N/A 05/05/2016   Procedure: ESOPHAGOGASTRODUODENOSCOPY (EGD) WITH PROPOFOL;  Surgeon: Christena Deem, MD;  Location: [facility] ENDOSCOPY;  Service: Endoscopy;  Laterality: N/A;   TONSILLECTOMY     WISDOM TOOTH EXTRACTION      Social History   Tobacco Use   Smoking status: Never   Smokeless tobacco: Never   Tobacco comments:    boyfriend smokes  Vaping Use   Vaping Use: Some days  Substance Use Topics   Alcohol use: No   Drug use: Yes    Types: Marijuana     Medication list has been reviewed and  updated.  Current Meds  Medication Sig   amphetamine-dextroamphetamine (ADDERALL) 30 MG tablet Take 30 mg by mouth daily. cBC   ergocalciferol (VITAMIN D2) 1.25 MG (50000 UT) capsule Take 1 capsule by mouth once a week.    PHQ 2/9 Scores 01/14/2021 09/18/2019 07/30/2019 06/27/2019  PHQ - 2 Score 2 0 0 0  PHQ- 9 Score 7 0 0 0    GAD 7 : Generalized Anxiety Score 01/14/2021 09/18/2019 07/30/2019 06/27/2019  Nervous, Anxious, on Edge 0 0 0 0  Control/stop worrying 1 0 0 0  Worry too much - different things 2  0 0 0  Trouble relaxing 2 0 0 0  Restless 0 0 0 0  Easily annoyed or irritable 2 0 0 1  Afraid - awful might happen 3 0 0 2  Total GAD 7 Score 10 0 0 3  Anxiety Difficulty - - Not difficult at all Not difficult at all    BP Readings from Last 3 Encounters:  01/14/21 104/80  10/01/20 (!) 97/46  06/28/20 124/68    Physical Exam Vitals and nursing note reviewed.  HENT:     Right Ear: Tympanic membrane normal.     Left Ear: Tympanic membrane normal.     Nose: No congestion or rhinorrhea.  Eyes:     General: Vision grossly intact.     Conjunctiva/sclera:     Right eye: Right conjunctiva is injected.  Cardiovascular:     Heart sounds: S1 normal and S2 normal.  No systolic murmur is present.  No diastolic murmur is present.    Wt Readings from Last 3 Encounters:  01/14/21 240 lb (108.9 kg)  10/01/20 230 lb (104.3 kg)  07/23/20 240 lb (108.9 kg)    BP 104/80   Pulse 85   Ht 5\' 3"  (1.6 m)   Wt 240 lb (108.9 kg)   LMP 01/08/2021 (Exact Date)   BMI 42.51 kg/m   Assessment and Plan:  1. Acute bacterial conjunctivitis of right eye New onset.  Recurrent.  Patient has had a history of hordeolum lower lid that is recurrent.  We will treat with TobraDex ophthalmic solution 1 drop in both eyes every 4 hours. - cephALEXin (KEFLEX) 500 MG capsule; Take 1 capsule (500 mg total) by mouth 2 (two) times daily.  Dispense: 15 capsule; Refill: 1 - tobramycin-dexamethasone (TOBRADEX) ophthalmic solution; Place 1 drop into both eyes every 4 (four) hours while awake.  Dispense: 5 mL; Refill: 0  2. Hordeolum internum of right lower eyelid Noted on an infrequent basis but is recurrent this is how it sometimes will occur as a area in the lateral aspect of the lower lid of the right eye.  We will treat with cephalexin 500 mg twice a day as well as TobraDex ophthalmic drops. - cephALEXin (KEFLEX) 500 MG capsule; Take 1 capsule (500 mg total) by mouth 2 (two) times daily.  Dispense: 15 capsule;  Refill: 1 - tobramycin-dexamethasone (TOBRADEX) ophthalmic solution; Place 1 drop into both eyes every 4 (four) hours while awake.  Dispense: 5 mL; Refill: 0  3. Acute maxillary sinusitis, recurrence not specified New onset.  Persistent.  Stable.  Consistent with a sinus infection for which patient will be on cephalexin 500 mg twice a day given the possibility of a blepharitis circumstance.  This should be sufficient to cover sinus infection.

## 2021-01-14 NOTE — Telephone Encounter (Signed)
Pt called back stating that a PA was needed for the cortiporin at [facility]. Pt was frustrated that she was not able to receive her medication. Pt was offered to contact the pharmacist as to an otc option for eye drops. Pt was also advised of goodrx. Pt declined paying $20 for her medication.

## 2021-01-14 NOTE — Telephone Encounter (Signed)
Pt is calling to report to the pharmacy contacted [facility] contacted her regarding tobramycin-dexamethasone [facility]) opthalmic solution [[id number]] not being covered under her insurance. The medication is $100. Pt would like to know if there is an alternative medication that can be sent to [facility] that is covered under her insurance. Please advise Cb- [phone]

## 2021-01-14 NOTE — Telephone Encounter (Signed)
Patient called I asking for  Cortisporin medication to be sent to [facility], [address], [place], [place] [address]. [phone]

## 2021-01-14 NOTE — Telephone Encounter (Signed)
Verbal order provided to [facility], [place]  [phone] Neomycin-polymyxin-hydrocortisone (cortisporin) 3.5-10000-1 Ophthalmic suspension 3 drops into right eye 4 (four) times daily. Dispense 7.5 ml.  No length of time on original order by Dr. Yetta Barre.

## 2021-01-17 ENCOUNTER — Ambulatory Visit: Payer: Self-pay | Admitting: *Deleted

## 2021-01-17 ENCOUNTER — Encounter: Payer: Self-pay | Admitting: Family Medicine

## 2021-01-17 ENCOUNTER — Other Ambulatory Visit: Payer: Self-pay

## 2021-01-17 ENCOUNTER — Ambulatory Visit (INDEPENDENT_AMBULATORY_CARE_PROVIDER_SITE_OTHER): Payer: Medicaid Other | Admitting: Family Medicine

## 2021-01-17 VITALS — BP 102/64 | HR 56 | Temp 98.6°F | Resp 18 | Ht 63.0 in | Wt 237.4 lb

## 2021-01-17 DIAGNOSIS — H00022 Hordeolum internum right lower eyelid: Secondary | ICD-10-CM

## 2021-01-17 DIAGNOSIS — H66011 Acute suppurative otitis media with spontaneous rupture of ear drum, right ear: Secondary | ICD-10-CM | POA: Diagnosis not present

## 2021-01-17 DIAGNOSIS — H1031 Unspecified acute conjunctivitis, right eye: Secondary | ICD-10-CM

## 2021-01-17 MED ORDER — NEOMYCIN-POLYMYXIN-HC 3.5-10000-1 OP SUSP
3.0000 [drp] | Freq: Four times a day (QID) | OPHTHALMIC | 0 refills | Status: DC
Start: 2021-01-17 — End: 2021-01-17

## 2021-01-17 MED ORDER — CIPROFLOXACIN-DEXAMETHASONE 0.3-0.1 % OT SUSP: 4.0000 [drp] | Freq: Two times a day (BID) | OTIC | 0 refills | Status: DC

## 2021-01-17 MED ORDER — CIPROFLOXACIN-HYDROCORTISONE 0.2-1 % OT SUSP: 3.0000 [drp] | Freq: Two times a day (BID) | OTIC | 0 refills | Status: DC

## 2021-01-17 MED ORDER — NEOMYCIN-POLYMYXIN-DEXAMETH 3.5-10000-0.1 OP SUSP: 2.0000 [drp] | Freq: Four times a day (QID) | OPHTHALMIC | 0 refills | Status: DC

## 2021-01-17 MED ORDER — AMOXICILLIN-POT CLAVULANATE 875-125 MG PO TABS: 1.0000 | ORAL_TABLET | Freq: Two times a day (BID) | ORAL | 0 refills | Status: DC

## 2021-01-17 MED ORDER — CEPHALEXIN 500 MG PO CAPS: 500.0000 mg | ORAL_CAPSULE | Freq: Two times a day (BID) | ORAL | 1 refills | Status: DC

## 2021-01-17 NOTE — Telephone Encounter (Signed)
Reason for Disposition  [1] SEVERE pain AND [2] not improved 2 hours after taking analgesic medication (e.g., ibuprofen or acetaminophen)  Answer Assessment - Initial Assessment Questions 1. LOCATION: "Which ear is involved?"     Pt calling in.  She saw Dr. Yetta Barre Friday.   She needs a PA for the eye drops so has not picked them up.  Even with [facility] it's over $100 . I'm sure my ear drum is ruptured.   I'm taking the antibiotic given to me on Friday. 2. ONSET: "When did the ear start hurting"      Friday about 7:00 PM my right ear started hurting.  I can't hear from it.   It's draining.   I can feel it popping. 3. SEVERITY: "How bad is the pain?"  (Scale 1-10; mild, moderate or severe)   - MILD (1-3): doesn't interfere with normal activities    - MODERATE (4-7): interferes with normal activities or awakens from sleep    - SEVERE (8-10): excruciating pain, unable to do any normal activities      6-7/10 4. URI SYMPTOMS: "Do you have a runny nose or cough?"     Sore throat still there same as Friday.  I have a runny nose.   I'm coughing up yellow mucus. 5. FEVER: "Do you have a fever?" If Yes, ask: "What is your temperature, how was it measured, and when did it start?"     I don't think so.    6. CAUSE: "Have you been swimming recently?", "How often do you use Q-TIPS?", "Have you had any recent air travel or scuba diving?"     No swimming.  I don't use Q tips, no air travel or scuba diving. 7. OTHER SYMPTOMS: "Do you have any other symptoms?" (e.g., headache, stiff neck, dizziness, vomiting, runny nose, decreased hearing)     Can't hear from much from right ear, it's muffled.  Headaches too.   No diarrhea or vomiting. 8. PREGNANCY: "Is there any chance you are pregnant?" "When was your last menstrual period?"     No!   I just finished my period and not been sexual active.  Protocols used: Davina Poke

## 2021-01-17 NOTE — Telephone Encounter (Signed)
Pt called in c/o her right ear drum possibly being ruptured.   She was seen by Dr. Elizabeth Sauer on Friday for URI symptoms.   Given an antibiotic and eye drops.   She hasn't been able to get the eye drops because her insurance requires a prior authorization.  It's over $100 even with [facility] so she can't afford it.   She is taking the antibiotic but she's not feeling better.   Her right ear was not bothering her until 7:30PM approximately on Friday night it suddenly started hurting real bad.   It's draining and her hearing is muffled.  See triage notes.  I made her an appt per the protocol to be seen within 4 hours for this morning at 10:40 in office visit.    Notes sent to [facility].

## 2021-01-17 NOTE — Progress Notes (Addendum)
Date:  01/17/2021   Name:  Zoe Hunter   DOB:  12/18/1995   MRN:  [id number]   Chief Complaint: Ear Pain (The pt comes in with Rt ear pain. She describe it as a constant throbbing pain with drainage with a yellowish color. X 4 days. /), Conjunctivitis (Bilateral eyes x 4 days. The pt was seen in the office Friday and  treated with a eye drop that she could not get filled because it was not covered by her insurance. ), and Sore Throat (Sore throat, difficulty swallowing, coughing, productive cough yellowish nasal drainage and headache x 4 days )  Conjunctivitis  The current episode started 5 to 7 days ago. The problem has been gradually worsening. Associated symptoms include ear pain, headaches, rhinorrhea, sore throat and cough. Pertinent negatives include no fever, no abdominal pain, no constipation, no diarrhea, no nausea, no ear discharge, no neck pain, no wheezing and no rash.  Sore Throat  This is a new problem. The problem has been waxing and waning. There has been no fever. Associated symptoms include coughing, ear pain and headaches. Pertinent negatives include no abdominal pain, diarrhea, drooling, ear discharge, neck pain or shortness of breath.  Otalgia  There is pain in the right ear. The current episode started in the past 7 days. The problem has been gradually worsening. There has been no fever. Associated symptoms include coughing, headaches, rhinorrhea and a sore throat. Pertinent negatives include no abdominal pain, diarrhea, ear discharge, neck pain or rash. She has tried antibiotics for the symptoms. The treatment provided no relief.   Lab Results  Component Value Date   CREATININE 0.62 06/28/2020   BUN 10 06/28/2020   NA 139 06/28/2020   K 3.6 06/28/2020   CL 104 06/28/2020   CO2 26 06/28/2020   Lab Results  Component Value Date   CHOL 158 12/22/2011   HDL 58 12/22/2011   LDLCALC 90 12/22/2011   TRIG 49 12/22/2011   Lab Results  Component Value Date   TSH 1.27  12/22/2011   Lab Results  Component Value Date   HGBA1C 4.7 12/22/2011   Lab Results  Component Value Date   WBC 9.3 06/28/2020   HGB 12.1 06/28/2020   HCT 38.0 06/28/2020   MCV 78.2 (L) 06/28/2020   PLT 366 06/28/2020   Lab Results  Component Value Date   ALT 15 05/08/2016   AST 24 05/08/2016   ALKPHOS 84 05/08/2016   BILITOT 0.6 05/08/2016   No components found for: VITD  Review of Systems  Constitutional:  Negative for chills and fever.  HENT:  Positive for ear pain, rhinorrhea and sore throat. Negative for drooling and ear discharge.   Respiratory:  Positive for cough. Negative for shortness of breath and wheezing.   Cardiovascular:  Negative for chest pain, palpitations and leg swelling.  Gastrointestinal:  Negative for abdominal pain, blood in stool, constipation, diarrhea and nausea.  Endocrine: Negative for polydipsia.  Genitourinary:  Negative for dysuria, frequency, hematuria and urgency.  Musculoskeletal:  Negative for back pain, myalgias and neck pain.  Skin:  Negative for rash.  Allergic/Immunologic: Negative for environmental allergies.  Neurological:  Positive for headaches. Negative for dizziness.  Hematological:  Does not bruise/bleed easily.  Psychiatric/Behavioral:  Negative for suicidal ideas. The patient is not nervous/anxious.    Patient Active Problem List   Diagnosis Date Noted   ADD (attention deficit disorder) 02/19/2015   Bipolar affective disorder (HCC) 02/19/2015   Clinical depression  02/19/2015   Cholelithiasis 01/28/2015    No Known Allergies  Past Surgical History:  Procedure Laterality Date   CHOLECYSTECTOMY N/A 02/16/2015   Procedure: LAPAROSCOPIC CHOLECYSTECTOMY;  Surgeon: Sherri Rad, MD;  Location: [facility] ORS;  Service: General;  Laterality: N/A;   ESOPHAGOGASTRODUODENOSCOPY (EGD) WITH PROPOFOL N/A 05/05/2016   Procedure: ESOPHAGOGASTRODUODENOSCOPY (EGD) WITH PROPOFOL;  Surgeon: Lollie Sails, MD;  Location: [facility] ENDOSCOPY;   Service: Endoscopy;  Laterality: N/A;   TONSILLECTOMY     WISDOM TOOTH EXTRACTION      Social History   Tobacco Use   Smoking status: Never   Smokeless tobacco: Never   Tobacco comments:    boyfriend smokes  Vaping Use   Vaping Use: Some days  Substance Use Topics   Alcohol use: No   Drug use: Yes    Types: Marijuana     Medication list has been reviewed and updated.  Current Meds  Medication Sig   amphetamine-dextroamphetamine (ADDERALL) 30 MG tablet Take 30 mg by mouth daily. cBC   cephALEXin (KEFLEX) 500 MG capsule Take 1 capsule (500 mg total) by mouth 2 (two) times daily.   DM-Phenylephrine-Acetaminophen (COLD/FLU DAYTIME RELIEF PO) Take by mouth.   ergocalciferol (VITAMIN D2) 1.25 MG (50000 UT) capsule Take 1 capsule by mouth once a week.    PHQ 2/9 Scores 01/14/2021 09/18/2019 07/30/2019 06/27/2019  PHQ - 2 Score 2 0 0 0  PHQ- 9 Score 7 0 0 0    GAD 7 : Generalized Anxiety Score 01/14/2021 09/18/2019 07/30/2019 06/27/2019  Nervous, Anxious, on Edge 0 0 0 0  Control/stop worrying 1 0 0 0  Worry too much - different things 2 0 0 0  Trouble relaxing 2 0 0 0  Restless 0 0 0 0  Easily annoyed or irritable 2 0 0 1  Afraid - awful might happen 3 0 0 2  Total GAD 7 Score 10 0 0 3  Anxiety Difficulty - - Not difficult at all Not difficult at all    BP Readings from Last 3 Encounters:  01/17/21 102/64  01/14/21 104/80  10/01/20 (!) 97/46    Physical Exam Constitutional:      Appearance: She is well-developed.  HENT:     Head: Normocephalic.     Right Ear: External ear normal. Tympanic membrane is erythematous.     Left Ear: External ear normal. Tympanic membrane is retracted.  Eyes:     General: Lids are everted, no foreign bodies appreciated. No scleral icterus.       Left eye: No foreign body or hordeolum.     Conjunctiva/sclera: Conjunctivae normal.     Right eye: Right conjunctiva is not injected.     Left eye: Left conjunctiva is not injected.     Pupils:  Pupils are equal, round, and reactive to light.  Neck:     Thyroid: No thyromegaly.     Vascular: No JVD.     Trachea: No tracheal deviation.  Cardiovascular:     Rate and Rhythm: Normal rate and regular rhythm.     Heart sounds: Normal heart sounds. No murmur heard.   No friction rub. No gallop.  Pulmonary:     Effort: Pulmonary effort is normal. No respiratory distress.     Breath sounds: Normal breath sounds. No wheezing or rales.  Abdominal:     General: Bowel sounds are normal.     Palpations: Abdomen is soft. There is no mass.     Tenderness: There is no abdominal  tenderness. There is no guarding or rebound.  Musculoskeletal:        General: No tenderness. Normal range of motion.     Cervical back: Normal range of motion and neck supple.  Lymphadenopathy:     Cervical: No cervical adenopathy.  Skin:    General: Skin is warm.     Findings: No rash.  Neurological:     Mental Status: She is alert and oriented to person, place, and time.     Cranial Nerves: No cranial nerve deficit.     Deep Tendon Reflexes: Reflexes normal.  Psychiatric:        Mood and Affect: Mood is not anxious or depressed.    Wt Readings from Last 3 Encounters:  01/17/21 237 lb 6.4 oz (107.7 kg)  01/14/21 240 lb (108.9 kg)  10/01/20 230 lb (104.3 kg)    BP 102/64 (BP Location: Left Arm, Patient Position: Sitting, Cuff Size: Large)   Pulse (!) 56   Resp 18   Ht 5\' 3"  (1.6 m)   Wt 237 lb 6.4 oz (107.7 kg)   LMP 01/08/2021 (Exact Date)   SpO2 99%   BMI 42.05 kg/m   Assessment and Plan:  1. Non-recurrent acute suppurative otitis media of right ear with spontaneous rupture of tympanic membrane New onset.  Persistent.  Stable.  Over the course of the weekend patient probably had a spontaneous perforation of the right eardrum.  Although she taking cephalexin patient continues to have discomfort of her right ear post drainage.  Likely this is otitis media and we will treat with Augmentin 875 mg  twice a day as well as ciprofloxacin eardrops. - amoxicillin-clavulanate (AUGMENTIN) 875-125 MG tablet; Take 1 tablet by mouth 2 (two) times daily.  Dispense: 20 tablet; Refill: 0  2. Acute bacterial conjunctivitis of right eye Chronic.  Persistent.  Patient was unable to afford either the neomycin or the TobraDex eyedrops.  We will switch to the neomycin with dexamethasone while on hydrocortisone and this was acceptable to her insurance and patient will take as directed.

## 2021-01-17 NOTE — Telephone Encounter (Signed)
Patient called in to inform Dr Yetta Barre that she have not gotten the eye drops and went the whole weekend without. Say that with [facility] its still $100 and she does not have that please advise  [phone]

## 2021-01-17 NOTE — Telephone Encounter (Signed)
Patient has appt today. Will discuss at todays appt.

## 2021-01-19 ENCOUNTER — Telehealth: Payer: Self-pay

## 2021-01-19 NOTE — Telephone Encounter (Signed)
Copied from CRM [id number]. Topic: General - Other >> Jan 19, 2021  9:01 AM Gaetana Michaelis A wrote: Reason for CRM: The patient would like to know when they may notice improvement in their hearing after the rupture of their ear drum   The patient shares that they are hearing a slight popping sound and feeling the sensation of fluid running   The patient declined to schedule an additional appointment at the time of call and would like to be contacted by a member of clinical staff

## 2021-01-19 NOTE — Telephone Encounter (Signed)
Spoke with patient and advised her per Dr. Yetta Barre that popping is normal as ears are trying to return to equilibrium.  Patient concerned about hearing loss and longevity.  Also advised patient that we can place a referral to ENT to further evaluation.  Patient requests to hold off on referral to see if symptoms resolve and will let our office know if she wants the referral.  Notified patient to continue full course of antibiotic ear drops as prescribed.  Verbalized understanding.

## 2021-01-27 DIAGNOSIS — Z419 Encounter for procedure for purposes other than remedying health state, unspecified: Secondary | ICD-10-CM | POA: Diagnosis not present

## 2021-01-31 DIAGNOSIS — R55 Syncope and collapse: Secondary | ICD-10-CM | POA: Diagnosis not present

## 2021-01-31 DIAGNOSIS — R0602 Shortness of breath: Secondary | ICD-10-CM | POA: Diagnosis not present

## 2021-01-31 DIAGNOSIS — R079 Chest pain, unspecified: Secondary | ICD-10-CM | POA: Diagnosis not present

## 2021-02-17 ENCOUNTER — Ambulatory Visit (INDEPENDENT_AMBULATORY_CARE_PROVIDER_SITE_OTHER): Payer: Medicaid Other | Admitting: Family Medicine

## 2021-02-17 ENCOUNTER — Encounter: Payer: Self-pay | Admitting: Family Medicine

## 2021-02-17 ENCOUNTER — Other Ambulatory Visit: Payer: Self-pay

## 2021-02-17 VITALS — BP 94/58 | HR 89 | Ht 63.0 in | Wt 242.0 lb

## 2021-02-17 DIAGNOSIS — N6322 Unspecified lump in the left breast, upper inner quadrant: Secondary | ICD-10-CM

## 2021-02-17 NOTE — Progress Notes (Signed)
Date:  02/17/2021   Name:  Zoe Hunter   DOB:  18-Oct-1995   MRN:  [id number]   Chief Complaint: Breast Problem (Was doing a breast exam and felt something around 12:00 in the L) breast. Some pain and soreness in that area. )  Patient is a 25 year old female who presents for  evaluation ofleft breast possible mass. The patient reports the following problems: "lump". Health maintenance has been reviewed up to date.     Lab Results  Component Value Date   NA 139 06/28/2020   K 3.6 06/28/2020   CO2 26 06/28/2020   GLUCOSE 88 06/28/2020   BUN 10 06/28/2020   CREATININE 0.62 06/28/2020   CALCIUM 9.2 06/28/2020   GFRNONAA >60 06/28/2020   Lab Results  Component Value Date   CHOL 158 12/22/2011   HDL 58 12/22/2011   LDLCALC 90 12/22/2011   TRIG 49 12/22/2011   Lab Results  Component Value Date   TSH 1.27 12/22/2011   Lab Results  Component Value Date   HGBA1C 4.7 12/22/2011   Lab Results  Component Value Date   WBC 9.3 06/28/2020   HGB 12.1 06/28/2020   HCT 38.0 06/28/2020   MCV 78.2 (L) 06/28/2020   PLT 366 06/28/2020   Lab Results  Component Value Date   ALT 15 05/08/2016   AST 24 05/08/2016   ALKPHOS 84 05/08/2016   BILITOT 0.6 05/08/2016   No results found for: 25OHVITD2, 25OHVITD3, VD25OH   Review of Systems  Constitutional:  Negative for chills and fever.  HENT:  Negative for drooling, ear discharge, ear pain and sore throat.   Respiratory:  Negative for cough, shortness of breath and wheezing.   Cardiovascular:  Negative for chest pain, palpitations and leg swelling.  Gastrointestinal:  Negative for abdominal pain, blood in stool, constipation, diarrhea and nausea.  Endocrine: Negative for polydipsia.  Genitourinary:  Negative for dysuria, frequency, hematuria and urgency.  Musculoskeletal:  Negative for back pain, myalgias and neck pain.  Skin:  Negative for rash.  Allergic/Immunologic: Negative for environmental allergies.  Neurological:   Negative for dizziness and headaches.  Hematological:  Does not bruise/bleed easily.  Psychiatric/Behavioral:  Negative for suicidal ideas. The patient is not nervous/anxious.    Patient Active Problem List   Diagnosis Date Noted   ADD (attention deficit disorder) 02/19/2015   Bipolar affective disorder (HCC) 02/19/2015   Clinical depression 02/19/2015   Cholelithiasis 01/28/2015    No Known Allergies  Past Surgical History:  Procedure Laterality Date   CHOLECYSTECTOMY N/A 02/16/2015   Procedure: LAPAROSCOPIC CHOLECYSTECTOMY;  Surgeon: Natale Lay, MD;  Location: [facility] ORS;  Service: General;  Laterality: N/A;   ESOPHAGOGASTRODUODENOSCOPY (EGD) WITH PROPOFOL N/A 05/05/2016   Procedure: ESOPHAGOGASTRODUODENOSCOPY (EGD) WITH PROPOFOL;  Surgeon: Christena Deem, MD;  Location: [facility] ENDOSCOPY;  Service: Endoscopy;  Laterality: N/A;   TONSILLECTOMY     WISDOM TOOTH EXTRACTION      Social History   Tobacco Use   Smoking status: Never   Smokeless tobacco: Never   Tobacco comments:    boyfriend smokes  Vaping Use   Vaping Use: Some days  Substance Use Topics   Alcohol use: No   Drug use: Yes    Types: Marijuana     Medication list has been reviewed and updated.  Current Meds  Medication Sig   amphetamine-dextroamphetamine (ADDERALL) 30 MG tablet Take 30 mg by mouth daily. cBC   ergocalciferol (VITAMIN D2) 1.25 MG (50000 UT)  capsule Take 1 capsule by mouth once a week.   [DISCONTINUED] amoxicillin-clavulanate (AUGMENTIN) 875-125 MG tablet Take 1 tablet by mouth 2 (two) times daily.   [DISCONTINUED] DM-Phenylephrine-Acetaminophen (COLD/FLU DAYTIME RELIEF PO) Take by mouth.    PHQ 2/9 Scores 01/14/2021 09/18/2019 07/30/2019 06/27/2019  PHQ - 2 Score 2 0 0 0  PHQ- 9 Score 7 0 0 0    GAD 7 : Generalized Anxiety Score 01/14/2021 09/18/2019 07/30/2019 06/27/2019  Nervous, Anxious, on Edge 0 0 0 0  Control/stop worrying 1 0 0 0  Worry too much - different things 2 0 0 0  Trouble  relaxing 2 0 0 0  Restless 0 0 0 0  Easily annoyed or irritable 2 0 0 1  Afraid - awful might happen 3 0 0 2  Total GAD 7 Score 10 0 0 3  Anxiety Difficulty - - Not difficult at all Not difficult at all    BP Readings from Last 3 Encounters:  02/17/21 (!) 94/58  01/17/21 102/64  01/14/21 104/80    Physical Exam Vitals and nursing note reviewed.  Constitutional:      General: She is not in acute distress.    Appearance: She is not diaphoretic.  Neck:     Thyroid: No thyromegaly.     Vascular: No JVD.  Cardiovascular:     Heart sounds: No murmur heard.   No gallop.  Pulmonary:     Breath sounds: No wheezing, rhonchi or rales.  Chest:  Breasts:    Right: Normal. No swelling, bleeding, inverted nipple, mass, nipple discharge, skin change or tenderness.     Left: Mass present. No swelling, bleeding, inverted nipple, nipple discharge, skin change or tenderness.       Comments: 10 o'clock 4 inches from nipple Lymphadenopathy:     Upper Body:     Right upper body: No axillary adenopathy.     Left upper body: No axillary adenopathy.  Neurological:     Deep Tendon Reflexes: Reflexes are normal and symmetric.    Wt Readings from Last 3 Encounters:  02/17/21 242 lb (109.8 kg)  01/17/21 237 lb 6.4 oz (107.7 kg)  01/14/21 240 lb (108.9 kg)    BP (!) 94/58    Pulse 89    Ht 5\' 3"  (1.6 m)    Wt 242 lb (109.8 kg)    LMP 01/24/2021 (Approximate)    SpO2 98%    BMI 42.87 kg/m   Assessment and Plan:  1. Mass of upper inner quadrant of left breast New onset.  Persistent.  Stable.  Patient palpated a small nodular area of about 1 cm in the left breast 10:00 area approximately 4 inches from the nipple.  We will begin evaluation with ultrasound unless we are directed otherwise. - US BREAST LTD UNI LEFT INC AXILLA

## 2021-02-27 DIAGNOSIS — Z419 Encounter for procedure for purposes other than remedying health state, unspecified: Secondary | ICD-10-CM | POA: Diagnosis not present

## 2021-03-03 ENCOUNTER — Other Ambulatory Visit: Payer: Self-pay

## 2021-03-03 ENCOUNTER — Ambulatory Visit
Admission: RE | Admit: 2021-03-03 | Discharge: 2021-03-03 | Disposition: A | Discharge status: Home / Self Care | Payer: Medicaid Other | Source: Ambulatory Visit | Attending: Family Medicine | Admitting: Family Medicine

## 2021-03-03 DIAGNOSIS — N644 Mastodynia: Secondary | ICD-10-CM | POA: Diagnosis not present

## 2021-03-03 DIAGNOSIS — N6322 Unspecified lump in the left breast, upper inner quadrant: Secondary | ICD-10-CM | POA: Insufficient documentation

## 2021-03-09 DIAGNOSIS — R569 Unspecified convulsions: Secondary | ICD-10-CM | POA: Diagnosis not present

## 2021-03-09 DIAGNOSIS — R0602 Shortness of breath: Secondary | ICD-10-CM | POA: Diagnosis not present

## 2021-03-09 DIAGNOSIS — R55 Syncope and collapse: Secondary | ICD-10-CM | POA: Diagnosis not present

## 2021-03-16 DIAGNOSIS — F331 Major depressive disorder, recurrent, moderate: Secondary | ICD-10-CM | POA: Diagnosis not present

## 2021-03-30 DIAGNOSIS — Z419 Encounter for procedure for purposes other than remedying health state, unspecified: Secondary | ICD-10-CM | POA: Diagnosis not present

## 2021-04-13 DIAGNOSIS — F331 Major depressive disorder, recurrent, moderate: Secondary | ICD-10-CM | POA: Diagnosis not present

## 2021-04-27 DIAGNOSIS — Z419 Encounter for procedure for purposes other than remedying health state, unspecified: Secondary | ICD-10-CM | POA: Diagnosis not present

## 2021-05-11 DIAGNOSIS — F900 Attention-deficit hyperactivity disorder, predominantly inattentive type: Secondary | ICD-10-CM | POA: Diagnosis not present

## 2021-05-11 DIAGNOSIS — F331 Major depressive disorder, recurrent, moderate: Secondary | ICD-10-CM | POA: Diagnosis not present

## 2021-05-28 DIAGNOSIS — Z419 Encounter for procedure for purposes other than remedying health state, unspecified: Secondary | ICD-10-CM | POA: Diagnosis not present

## 2021-06-02 ENCOUNTER — Other Ambulatory Visit: Payer: Self-pay

## 2021-06-02 DIAGNOSIS — K219 Gastro-esophageal reflux disease without esophagitis: Secondary | ICD-10-CM | POA: Insufficient documentation

## 2021-06-07 DIAGNOSIS — F411 Generalized anxiety disorder: Secondary | ICD-10-CM | POA: Diagnosis not present

## 2021-06-27 DIAGNOSIS — Z419 Encounter for procedure for purposes other than remedying health state, unspecified: Secondary | ICD-10-CM | POA: Diagnosis not present

## 2021-07-07 DIAGNOSIS — R55 Syncope and collapse: Secondary | ICD-10-CM | POA: Diagnosis not present

## 2021-07-08 DIAGNOSIS — H5213 Myopia, bilateral: Secondary | ICD-10-CM | POA: Diagnosis not present

## 2021-07-11 DIAGNOSIS — F411 Generalized anxiety disorder: Secondary | ICD-10-CM | POA: Diagnosis not present

## 2021-07-19 DIAGNOSIS — F411 Generalized anxiety disorder: Secondary | ICD-10-CM | POA: Diagnosis not present

## 2021-07-26 DIAGNOSIS — F411 Generalized anxiety disorder: Secondary | ICD-10-CM | POA: Diagnosis not present

## 2021-07-28 DIAGNOSIS — Z419 Encounter for procedure for purposes other than remedying health state, unspecified: Secondary | ICD-10-CM | POA: Diagnosis not present

## 2021-08-02 DIAGNOSIS — F411 Generalized anxiety disorder: Secondary | ICD-10-CM | POA: Diagnosis not present

## 2021-08-09 DIAGNOSIS — F411 Generalized anxiety disorder: Secondary | ICD-10-CM | POA: Diagnosis not present

## 2021-08-16 DIAGNOSIS — F411 Generalized anxiety disorder: Secondary | ICD-10-CM | POA: Diagnosis not present

## 2021-08-23 DIAGNOSIS — L03032 Cellulitis of left toe: Secondary | ICD-10-CM | POA: Diagnosis not present

## 2021-08-23 DIAGNOSIS — F411 Generalized anxiety disorder: Secondary | ICD-10-CM | POA: Diagnosis not present

## 2021-08-27 DIAGNOSIS — Z419 Encounter for procedure for purposes other than remedying health state, unspecified: Secondary | ICD-10-CM | POA: Diagnosis not present

## 2021-08-31 DIAGNOSIS — F900 Attention-deficit hyperactivity disorder, predominantly inattentive type: Secondary | ICD-10-CM | POA: Diagnosis not present

## 2021-09-01 DIAGNOSIS — F411 Generalized anxiety disorder: Secondary | ICD-10-CM | POA: Diagnosis not present

## 2021-09-06 DIAGNOSIS — F411 Generalized anxiety disorder: Secondary | ICD-10-CM | POA: Diagnosis not present

## 2021-09-13 DIAGNOSIS — F411 Generalized anxiety disorder: Secondary | ICD-10-CM | POA: Diagnosis not present

## 2021-09-15 DIAGNOSIS — F411 Generalized anxiety disorder: Secondary | ICD-10-CM | POA: Diagnosis not present

## 2021-09-20 DIAGNOSIS — F411 Generalized anxiety disorder: Secondary | ICD-10-CM | POA: Diagnosis not present

## 2021-09-27 DIAGNOSIS — Z419 Encounter for procedure for purposes other than remedying health state, unspecified: Secondary | ICD-10-CM | POA: Diagnosis not present

## 2021-09-27 DIAGNOSIS — F411 Generalized anxiety disorder: Secondary | ICD-10-CM | POA: Diagnosis not present

## 2021-09-29 DIAGNOSIS — F411 Generalized anxiety disorder: Secondary | ICD-10-CM | POA: Diagnosis not present

## 2021-10-04 DIAGNOSIS — F411 Generalized anxiety disorder: Secondary | ICD-10-CM | POA: Diagnosis not present

## 2021-10-11 DIAGNOSIS — F411 Generalized anxiety disorder: Secondary | ICD-10-CM | POA: Diagnosis not present

## 2021-10-13 DIAGNOSIS — F411 Generalized anxiety disorder: Secondary | ICD-10-CM | POA: Diagnosis not present

## 2021-10-18 DIAGNOSIS — F411 Generalized anxiety disorder: Secondary | ICD-10-CM | POA: Diagnosis not present

## 2021-10-25 DIAGNOSIS — F411 Generalized anxiety disorder: Secondary | ICD-10-CM | POA: Diagnosis not present

## 2021-10-28 DIAGNOSIS — F411 Generalized anxiety disorder: Secondary | ICD-10-CM | POA: Diagnosis not present

## 2021-10-28 DIAGNOSIS — Z419 Encounter for procedure for purposes other than remedying health state, unspecified: Secondary | ICD-10-CM | POA: Diagnosis not present

## 2021-11-10 DIAGNOSIS — F411 Generalized anxiety disorder: Secondary | ICD-10-CM | POA: Diagnosis not present

## 2021-11-15 DIAGNOSIS — F411 Generalized anxiety disorder: Secondary | ICD-10-CM | POA: Diagnosis not present

## 2021-11-22 ENCOUNTER — Telehealth: Payer: Self-pay | Admitting: Family Medicine

## 2021-11-22 ENCOUNTER — Ambulatory Visit: Payer: Self-pay

## 2021-11-22 DIAGNOSIS — F411 Generalized anxiety disorder: Secondary | ICD-10-CM | POA: Diagnosis not present

## 2021-11-22 NOTE — Telephone Encounter (Signed)
Summary: Stye in left eye   Has had a stye in her left eye for over 6 months, wants a new referral for and advice for what to do. She leaves on vacation in two weeks.   Best contact: [phone]       2nd call attempted to contact patient to review sx of "stye" on eye. See Dr. Ronnald Ramp recommendation for appt or to call [facility] and see if they will see her without a referral. If referral needed come in. No answer, LVMTCB [phone].

## 2021-11-22 NOTE — Telephone Encounter (Signed)
  Chief Complaint: stye Symptoms: stye L eye, redness, swelling, irritation, itching Frequency: ongoing since May Pertinent Negatives: NA Disposition: [] ED /[] Urgent Care (no appt availability in office) / [x] Appointment(In office/virtual)/ []  [facility]/ [] Home Care/ [] Refused Recommended Disposition /[] [facility]/ []  Follow-up with PCP Additional Notes: advised pt of recommendation of Dr. Ronnald Ramp wanting her to see [facility]. Pt states she went there. They gave her an eye ointment and it helped but didn't need her to fu. Pt states if Dr. Ronnald Ramp can prescribe that would be great but since she thinks she may have conjunctivitis as well recommended scheduling appt. Pt scheduled appt for tomorrow morning at 0820. Pt states she has been doing heat compresses and lubricants and nothing has helped.   Reason for Disposition  [1] After 5 days of treatment per [facility] Advice AND [2] not better  Answer Assessment - Initial Assessment Questions 1. LOCATION: "Which eye has the sty?" "Upper or lower eyelid?"     L eye, upper lid 2. SIZE: "How big is it?" (Note: standard pencil eraser is 6 mm)     1/2 size of penny 3. EYELID: "Is the eyelid swollen?" If Yes, ask: "How much?"     yes 4. REDNESS: "Has the redness spread onto the eyelid?"     yes 5. ONSET: "When did you notice the sty?"     6 months 7. PAIN: "Is it painful?" If Yes, ask: "How bad is the pain?"  (Scale 1-10; or mild, moderate, severe)     2-3 8. CONTACTS: "Do you wear contacts?"     no 9. OTHER SYMPTOMS: "Do you have any other symptoms?" (e.g., fever)     Itching, inside of eye is pink as well  Protocols used: Sty-A-AH

## 2021-11-22 NOTE — Telephone Encounter (Signed)
Copied from [place] [id number]. Topic: Referral - Request for Referral >> Nov 22, 2021  3:10 PM Oley Balm E wrote: Has patient seen PCP for this complaint? Yes.   *If NO, is insurance requiring patient see PCP for this issue before PCP can refer them? Referral for which specialty: Eye Preferred provider/office: [facility] Re[facility] Reason for referral: Has had a stye for over 6 months

## 2021-11-22 NOTE — Telephone Encounter (Signed)
Patient called, left VM to return the call to the office to discuss symptoms with a nurse.  Summary: Stye in left eye   Has had a stye in her left eye for over 6 months, wants a new referral for and advice for what to do. She leaves on vacation in two weeks.   Best contact: [phone]

## 2021-11-23 ENCOUNTER — Telehealth: Payer: Self-pay | Admitting: Family Medicine

## 2021-11-23 ENCOUNTER — Ambulatory Visit (INDEPENDENT_AMBULATORY_CARE_PROVIDER_SITE_OTHER): Payer: Medicaid Other | Admitting: Family Medicine

## 2021-11-23 ENCOUNTER — Encounter: Payer: Self-pay | Admitting: Family Medicine

## 2021-11-23 VITALS — BP 122/80 | HR 76 | Ht 63.0 in | Wt 237.0 lb

## 2021-11-23 DIAGNOSIS — Z8759 Personal history of other complications of pregnancy, childbirth and the puerperium: Secondary | ICD-10-CM | POA: Diagnosis not present

## 2021-11-23 DIAGNOSIS — Z1389 Encounter for screening for other disorder: Secondary | ICD-10-CM | POA: Diagnosis not present

## 2021-11-23 DIAGNOSIS — F331 Major depressive disorder, recurrent, moderate: Secondary | ICD-10-CM | POA: Diagnosis not present

## 2021-11-23 DIAGNOSIS — F900 Attention-deficit hyperactivity disorder, predominantly inattentive type: Secondary | ICD-10-CM | POA: Diagnosis not present

## 2021-11-23 DIAGNOSIS — H01004 Unspecified blepharitis left upper eyelid: Secondary | ICD-10-CM | POA: Diagnosis not present

## 2021-11-23 DIAGNOSIS — Z79899 Other long term (current) drug therapy: Secondary | ICD-10-CM | POA: Diagnosis not present

## 2021-11-23 DIAGNOSIS — F411 Generalized anxiety disorder: Secondary | ICD-10-CM | POA: Diagnosis not present

## 2021-11-23 DIAGNOSIS — N926 Irregular menstruation, unspecified: Secondary | ICD-10-CM | POA: Diagnosis not present

## 2021-11-23 MED ORDER — NEOMYCIN-POLYMYXIN-DEXAMETH 3.5-10000-0.1 OP OINT: 1.0000 | TOPICAL_OINTMENT | Freq: Three times a day (TID) | OPHTHALMIC | 0 refills | Status: DC

## 2021-11-23 MED ORDER — ERYTHROMYCIN 5 MG/GM OP OINT: 1.0000 | TOPICAL_OINTMENT | Freq: Three times a day (TID) | OPHTHALMIC | 0 refills | Status: DC

## 2021-11-23 NOTE — Progress Notes (Signed)
neomyci   Date:  11/23/2021   Name:  Zoe Hunter   DOB:  Apr 12, 1995   MRN:  [id number]   Chief Complaint: Eye Problem and Menstrual Problem  Eye Problem  The left eye is affected. This is a new problem. The current episode started 1 to 4 weeks ago. The problem occurs constantly. The problem has been gradually improving. The pain is mild. Pertinent negatives include no blurred vision, eye discharge, double vision, eye redness, fever or nausea.    Lab Results  Component Value Date   NA 139 06/28/2020   K 3.6 06/28/2020   CO2 26 06/28/2020   GLUCOSE 88 06/28/2020   BUN 10 06/28/2020   CREATININE 0.62 06/28/2020   CALCIUM 9.2 06/28/2020   GFRNONAA >60 06/28/2020   Lab Results  Component Value Date   CHOL 158 12/22/2011   HDL 58 12/22/2011   LDLCALC 90 12/22/2011   TRIG 49 12/22/2011   Lab Results  Component Value Date   TSH 1.27 12/22/2011   Lab Results  Component Value Date   HGBA1C 4.7 12/22/2011   Lab Results  Component Value Date   WBC 9.3 06/28/2020   HGB 12.1 06/28/2020   HCT 38.0 06/28/2020   MCV 78.2 (L) 06/28/2020   PLT 366 06/28/2020   Lab Results  Component Value Date   ALT 15 05/08/2016   AST 24 05/08/2016   ALKPHOS 84 05/08/2016   BILITOT 0.6 05/08/2016   No results found for: "25OHVITD2", "25OHVITD3", "VD25OH"   Review of Systems  Constitutional:  Negative for chills and fever.  HENT:  Negative for drooling, ear discharge, ear pain and sore throat.   Eyes:  Negative for blurred vision, double vision, discharge and redness.  Respiratory:  Negative for cough, shortness of breath and wheezing.   Cardiovascular:  Negative for chest pain, palpitations and leg swelling.  Gastrointestinal:  Negative for abdominal pain, blood in stool, constipation, diarrhea and nausea.  Endocrine: Negative for polydipsia.  Genitourinary:  Positive for menstrual problem. Negative for dysuria, frequency, hematuria and urgency.  Musculoskeletal:  Negative for back pain,  myalgias and neck pain.  Skin:  Negative for rash.  Allergic/Immunologic: Negative for environmental allergies.  Neurological:  Negative for dizziness and headaches.  Hematological:  Does not bruise/bleed easily.  Psychiatric/Behavioral:  Negative for suicidal ideas. The patient is not nervous/anxious.     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   Nausea/vomiting in pregnancy 08/29/2018   LGSIL on Pap smear of cervix 05/24/2017   ADD (attention deficit disorder) 02/19/2015   Bipolar affective disorder (HCC) 02/19/2015   Clinical depression 02/19/2015   Cholelithiasis 01/28/2015    No Known Allergies  Past Surgical History:  Procedure Laterality Date   CHOLECYSTECTOMY N/A 02/16/2015   Procedure: LAPAROSCOPIC CHOLECYSTECTOMY;  Surgeon: Natale Lay, MD;  Location: [facility] ORS;  Service: General;  Laterality: N/A;   ESOPHAGOGASTRODUODENOSCOPY (EGD) WITH PROPOFOL N/A 05/05/2016   Procedure: ESOPHAGOGASTRODUODENOSCOPY (EGD) WITH PROPOFOL;  Surgeon: Christena Deem, MD;  [facility]: [facility] ENDOSCOPY;  Service: Endoscopy;  Laterality: N/A;   TONSILLECTOMY     WISDOM TOOTH EXTRACTION  Social History   Tobacco Use   Smoking status: Never   Smokeless tobacco: Never   Tobacco comments:    boyfriend smokes  Vaping Use   Vaping Use: Some days  Substance Use Topics   Alcohol use: No   Drug use: Yes    Types: Marijuana     Medication list has been reviewed and updated.  Current Meds  Medication Sig   amphetamine-dextroamphetamine (ADDERALL) 30 MG tablet Take 30 mg by mouth daily. cBC   ergocalciferol  (VITAMIN D2) 1.25 MG (50000 UT) capsule Take 1 capsule by mouth once a week.       11/23/2021    8:39 AM 01/14/2021    3:31 PM 09/18/2019   10:52 AM 07/30/2019   11:25 AM  GAD 7 : Generalized Anxiety Score  Nervous, Anxious, on Edge 0 0 0 0  Control/stop worrying 0 1 0 0  Worry too much - different things 0 2 0 0  Trouble relaxing 0 2 0 0  Restless 0 0 0 0  Easily annoyed or irritable 0 2 0 0  Afraid - awful might happen 0 3 0 0  Total GAD 7 Score 0 10 0 0  Anxiety Difficulty Not difficult at all   Not difficult at all       11/23/2021    8:39 AM 01/14/2021    3:31 PM 09/18/2019   10:52 AM  Depression screen PHQ 2/9  Decreased Interest 0 1 0  Down, Depressed, Hopeless 0 1 0  PHQ - 2 Score 0 2 0  Altered sleeping 0 1 0  Tired, decreased energy 0 1 0  Change in appetite 0 2 0  Feeling bad or failure about yourself  0 0 0  Trouble concentrating 0 0 0  Moving slowly or fidgety/restless 0 1 0  Suicidal thoughts 0 0 0  PHQ-9 Score 0 7 0  Difficult doing work/chores Not difficult at all Somewhat difficult     BP Readings from Last 3 Encounters:  11/23/21 122/80  02/17/21 (!) 94/58  01/17/21 102/64    Physical Exam Vitals and nursing note reviewed. Exam conducted with a chaperone present.  Constitutional:      General: She is not in acute distress.    Appearance: She is well-groomed. She is not diaphoretic.  HENT:     Head: Normocephalic and atraumatic.     Right Ear: Tympanic membrane and external ear normal.     Left Ear: Tympanic membrane and external ear normal.     Nose: Nose normal.  Eyes:     General:        Right eye: No discharge.        Left eye: No discharge.     Conjunctiva/sclera: Conjunctivae normal.     Pupils: Pupils are equal, round, and reactive to light.  Neck:     Thyroid: No thyromegaly.     Vascular: No JVD.  Cardiovascular:     Rate and Rhythm: Normal rate and regular rhythm.     Heart sounds: Normal heart sounds. No murmur heard.    No  friction rub. No gallop.  Pulmonary:     Effort: Pulmonary effort is normal.     Breath sounds: Normal breath sounds. No wheezing, rhonchi or rales.  Abdominal:     General: Bowel sounds are normal.     Palpations: Abdomen is soft. There is no mass.     Tenderness: There is no abdominal tenderness. There is no guarding.  Musculoskeletal:        General: Normal range of motion.     Cervical back: Normal range of motion and neck supple.  Lymphadenopathy:     Cervical: No cervical adenopathy.  Skin:    General: Skin is warm and dry.  Neurological:     Mental Status: She is alert.     Deep Tendon Reflexes: Reflexes are normal and symmetric.  Psychiatric:        Behavior: Behavior is cooperative.     Wt Readings from Last 3 Encounters:  11/23/21 237 lb (107.5 kg)  02/17/21 242 lb (109.8 kg)  01/17/21 237 lb 6.4 oz (107.7 kg)    BP 122/80   Pulse 76   Ht 5\' 3"  (1.6 m)   Wt 237 lb (107.5 kg)   LMP 11/16/2021 (Exact Date)   BMI 41.98 kg/m   Assessment and Plan:  1. Blepharitis of left upper eyelid, unspecified type Chronic.  Episodic.  Presently active.  Over the course of years there is been off-and-on flareups of what were initially styes but they have become progressively more more inability to control.  Patient has noted some flaking along the eyelash and there is a presence of a stye on the underlying upper eyelid on the left.  I think this may be a combination of a stye and blepharitis with staph infections being the likely etiology.  We will treat with washing with baby shampoo and warm compresses and initiating a neomycin polymyxin dexamethasone ophthalmic preparation 3 times a day.  We will also refer to ophthalmology given the ongoing nature of these recurrences. - Ambulatory referral to Ophthalmology - neomycin-polymyxin b-dexamethasone (MAXITROL) 3.5-10000-0.1 OINT; Place 1 Application into the left eye 3 (three) times daily.  Dispense: 3.5 g; Refill: 0  2. Irregular  periods Patient had 1 episode this past month that there was what sounds like brown/dried blood that was noted and now she is presently on her.  Which is at the regular time.  Patient has concerned about this irregularity but I assured her that this may happen as a only one-time occurrence but we will be glad to refer to gynecology given that she has issues as noted below. - Ambulatory referral to Gynecology  3. History of hyperemesis gravidarum Patient has a history of hyperemesis gravidarum with her last pregnancy that required significant attention.  Patient is in the process of trying to conceive again and would like to be in a OB/GYN circumstance prior to pregnancy so that she will have this in place in the future. - Ambulatory referral to Gynecology    Otilio Miu, MD

## 2021-11-23 NOTE — Telephone Encounter (Signed)
Pt calling stating the neomycin-polymyxin b-dexamethasone (MAXITROL) 3.5-10000-0.1 OINT Requires a prior auth. Pt states if this can be resolved by 3pm, she can pick it up. But if not, please transfer to her pharmacy near her home and she will deal w/ prior auth then Send to [id number] - [place], [place]  Please call pt w/ status of Rx [phone]

## 2021-11-23 NOTE — Patient Instructions (Signed)
Blepharitis Blepharitis refers to inflammation of the eyelids. It is a common condition and can cause dryness or grittiness in the eyes. Other symptoms may include: Reddish, scaly skin around the scalp and eyebrows. Burning or itching of the eyelids. Eye discharge at night that causes the eyelashes to stick together in the morning. Eyelashes that fall out. Redness of the eyes. Sensitivity to light. Follow these instructions at home: Pay attention to any changes in how your eyes look or feel. Tell your health care provider about any changes. Follow these instructions to help with your condition. Keeping clean Wash your hands often with soap and water for at least 20 seconds. Clean your eyes and wash the edges of your eyelids with diluted baby shampoo or commercial eyelid wipes. Do this 2 or more times a day. Wash your face and eyebrows at least once a day. Use a clean towel each time you dry your eyelids. Do not use this towel to clean or dry other areas of your body. Do not share your towel with anyone. General instructions Avoid wearing makeup until you get better. Do not share makeup with anyone. Avoid rubbing your eyes. Use warm compresses on the eyes for 5-10 minutes. Do this 1 or 2 times a day, or as told by your health care provider. You can use warm water on a towel, but a microwaveable heating pad often stays warm longer. The pad should be very warm but not hot enough to burn the skin. If you were prescribed an antibiotic ointment or steroid drops, apply or use the medicine as told by your health care provider. Do not stop using the medicine even if you feel better. Keep all follow-up visits. This is important. Contact a health care provider if: Your eyelids feel hot. You have blisters or a rash on your eyelids. The inflammation gets worse or does not go away in 2-4 days. Get help right away if: You have pain or redness that gets worse or spreads to other parts of your face. Your  vision changes. You have pain when looking at lights or moving objects. You have a fever. Summary Blepharitis refers to inflammation of the eyelids. It can cause dryness and grittiness in the eyes. Pay attention to any changes in how your eyes look or feel. Tell your health care provider about any changes. Follow home care instructions as told by your health care provider. Wash your hands often with soap and water for at least 20 seconds. Avoid wearing makeup. Do not rub your eyes. If you were prescribed an antibiotic ointment or steroid drops, apply or use the medicine as told by your health care provider. Get help right away if you have a fever, vision changes, pain or redness that gets worse or spreads to other parts of your face, or pain when looking at lights or moving objects. This information is not intended to replace advice given to you by your health care provider. Make sure you discuss any questions you have with your health care provider. Document Revised: 03/17/2020 Document Reviewed: 03/17/2020 Elsevier Patient Education  2023 [employer]vier Inc.  

## 2021-11-23 NOTE — Addendum Note (Signed)
Addended by: Fredderick Severance on: 11/23/2021 01:28 PM   Modules accepted: Orders

## 2021-11-27 DIAGNOSIS — Z419 Encounter for procedure for purposes other than remedying health state, unspecified: Secondary | ICD-10-CM | POA: Diagnosis not present

## 2021-11-29 DIAGNOSIS — F411 Generalized anxiety disorder: Secondary | ICD-10-CM | POA: Diagnosis not present

## 2021-12-01 DIAGNOSIS — H00024 Hordeolum internum left upper eyelid: Secondary | ICD-10-CM | POA: Diagnosis not present

## 2021-12-01 DIAGNOSIS — H0102B Squamous blepharitis left eye, upper and lower eyelids: Secondary | ICD-10-CM | POA: Diagnosis not present

## 2021-12-01 DIAGNOSIS — H0288B Meibomian gland dysfunction left eye, upper and lower eyelids: Secondary | ICD-10-CM | POA: Diagnosis not present

## 2021-12-01 DIAGNOSIS — H0102A Squamous blepharitis right eye, upper and lower eyelids: Secondary | ICD-10-CM | POA: Diagnosis not present

## 2021-12-01 DIAGNOSIS — H0288A Meibomian gland dysfunction right eye, upper and lower eyelids: Secondary | ICD-10-CM | POA: Diagnosis not present

## 2021-12-09 ENCOUNTER — Encounter: Payer: Self-pay | Admitting: Obstetrics and Gynecology

## 2021-12-09 ENCOUNTER — Telehealth: Payer: Self-pay

## 2021-12-09 NOTE — Telephone Encounter (Signed)
I called [facility] and left name and number of Sara/ [facility] for her to call on Monday to schedule her appt. They have tried "multiple times" to get her. I asked Clarise Cruz to give it til Tuesday and if no response, close referral out.

## 2021-12-12 ENCOUNTER — Telehealth: Payer: Self-pay | Admitting: Family Medicine

## 2021-12-12 NOTE — Telephone Encounter (Signed)
Copied from [place] [id number]. Topic: Referral - Request for Referral >> Dec 12, 2021  8:33 AM Everette C wrote: Has patient seen PCP for this complaint? No. *If NO, is insurance requiring patient see PCP for this issue before PCP can refer them? Referral for which specialty: OBGYN  Preferred provider/office: [place], [place] Reason for referral: women's health

## 2021-12-13 DIAGNOSIS — F411 Generalized anxiety disorder: Secondary | ICD-10-CM | POA: Diagnosis not present

## 2021-12-14 NOTE — Telephone Encounter (Signed)
Patient was returning your call in reference to scheduling incoming referral. Patient states she informed the referring physician that she lives in [place] and is not able to come to [place] for the appointment. Informed patient to contact referring physician to have the referral sent somewhere in [place]. Informed the patient of the center for women's healthcare at [place]. Patient verbalized understanding. Per Benny Lennert on 12/12/21

## 2021-12-20 DIAGNOSIS — F411 Generalized anxiety disorder: Secondary | ICD-10-CM | POA: Diagnosis not present

## 2021-12-21 DIAGNOSIS — H00024 Hordeolum internum left upper eyelid: Secondary | ICD-10-CM | POA: Diagnosis not present

## 2021-12-27 DIAGNOSIS — F411 Generalized anxiety disorder: Secondary | ICD-10-CM | POA: Diagnosis not present

## 2021-12-28 DIAGNOSIS — Z419 Encounter for procedure for purposes other than remedying health state, unspecified: Secondary | ICD-10-CM | POA: Diagnosis not present

## 2022-01-03 DIAGNOSIS — N926 Irregular menstruation, unspecified: Secondary | ICD-10-CM | POA: Diagnosis not present

## 2022-01-03 DIAGNOSIS — F411 Generalized anxiety disorder: Secondary | ICD-10-CM | POA: Diagnosis not present

## 2022-01-05 DIAGNOSIS — F411 Generalized anxiety disorder: Secondary | ICD-10-CM | POA: Diagnosis not present

## 2022-01-10 DIAGNOSIS — F411 Generalized anxiety disorder: Secondary | ICD-10-CM | POA: Diagnosis not present

## 2022-01-27 DIAGNOSIS — Z419 Encounter for procedure for purposes other than remedying health state, unspecified: Secondary | ICD-10-CM | POA: Diagnosis not present

## 2022-02-15 DIAGNOSIS — F900 Attention-deficit hyperactivity disorder, predominantly inattentive type: Secondary | ICD-10-CM | POA: Diagnosis not present

## 2022-02-15 DIAGNOSIS — F411 Generalized anxiety disorder: Secondary | ICD-10-CM | POA: Diagnosis not present

## 2022-02-27 DIAGNOSIS — Z419 Encounter for procedure for purposes other than remedying health state, unspecified: Secondary | ICD-10-CM | POA: Diagnosis not present

## 2022-02-28 DIAGNOSIS — F411 Generalized anxiety disorder: Secondary | ICD-10-CM | POA: Diagnosis not present

## 2022-03-01 DIAGNOSIS — S61211A Laceration without foreign body of left index finger without damage to nail, initial encounter: Secondary | ICD-10-CM | POA: Diagnosis not present

## 2022-03-01 DIAGNOSIS — Z23 Encounter for immunization: Secondary | ICD-10-CM | POA: Diagnosis not present

## 2022-03-07 DIAGNOSIS — F411 Generalized anxiety disorder: Secondary | ICD-10-CM | POA: Diagnosis not present

## 2022-03-08 DIAGNOSIS — F411 Generalized anxiety disorder: Secondary | ICD-10-CM | POA: Diagnosis not present

## 2022-03-10 ENCOUNTER — Telehealth: Payer: Self-pay | Admitting: Family Medicine

## 2022-03-10 NOTE — Telephone Encounter (Signed)
Referral Request - Has patient seen PCP for this complaint? yes *If NO, is insurance requiring patient see PCP for this issue before PCP can refer them? Referral for which specialty: gynocologist/she states she had one that can't see her again for a year and doesn't want to wait that long. Preferred provider/office: ? Reason for referral: trying to get pregnant

## 2022-03-14 DIAGNOSIS — F411 Generalized anxiety disorder: Secondary | ICD-10-CM | POA: Diagnosis not present

## 2022-03-21 DIAGNOSIS — F411 Generalized anxiety disorder: Secondary | ICD-10-CM | POA: Diagnosis not present

## 2022-03-28 DIAGNOSIS — F411 Generalized anxiety disorder: Secondary | ICD-10-CM | POA: Diagnosis not present

## 2022-03-30 DIAGNOSIS — Z419 Encounter for procedure for purposes other than remedying health state, unspecified: Secondary | ICD-10-CM | POA: Diagnosis not present

## 2022-04-04 ENCOUNTER — Telehealth: Payer: Self-pay | Admitting: Family Medicine

## 2022-04-04 NOTE — Telephone Encounter (Signed)
Copied from [place] [id number]. Topic: General - Inquiry >> Apr 04, 2022 10:41 AM Marcellus Scott wrote: Reason for CRM: Pt stated that she confirmed over the weekend that she is pregnant. She stated she needs an okay from Dr. Ronnald Ramp for x-rays for her dentist. Pt mentioned she called her OBGYN and stated they can't see her until she is 8 weeks has an appointment on 03/11.  Per pt request, I scheduled an appointment for 04/05/22 just in case she needs to see Dr.Jones.  Please advise.

## 2022-04-05 ENCOUNTER — Ambulatory Visit: Payer: Medicaid Other | Admitting: Family Medicine

## 2022-04-06 DIAGNOSIS — F411 Generalized anxiety disorder: Secondary | ICD-10-CM | POA: Diagnosis not present

## 2022-04-13 DIAGNOSIS — F411 Generalized anxiety disorder: Secondary | ICD-10-CM | POA: Diagnosis not present

## 2022-04-20 DIAGNOSIS — F411 Generalized anxiety disorder: Secondary | ICD-10-CM | POA: Diagnosis not present

## 2022-04-23 DIAGNOSIS — O219 Vomiting of pregnancy, unspecified: Secondary | ICD-10-CM | POA: Diagnosis not present

## 2022-04-23 DIAGNOSIS — Z3A00 Weeks of gestation of pregnancy not specified: Secondary | ICD-10-CM | POA: Diagnosis not present

## 2022-04-23 DIAGNOSIS — Z3201 Encounter for pregnancy test, result positive: Secondary | ICD-10-CM | POA: Diagnosis not present

## 2022-04-27 DIAGNOSIS — F902 Attention-deficit hyperactivity disorder, combined type: Secondary | ICD-10-CM | POA: Diagnosis not present

## 2022-04-27 DIAGNOSIS — F411 Generalized anxiety disorder: Secondary | ICD-10-CM | POA: Diagnosis not present

## 2022-04-27 DIAGNOSIS — F331 Major depressive disorder, recurrent, moderate: Secondary | ICD-10-CM | POA: Diagnosis not present

## 2022-04-28 DIAGNOSIS — Z419 Encounter for procedure for purposes other than remedying health state, unspecified: Secondary | ICD-10-CM | POA: Diagnosis not present

## 2022-05-04 DIAGNOSIS — F411 Generalized anxiety disorder: Secondary | ICD-10-CM | POA: Diagnosis not present

## 2022-05-04 DIAGNOSIS — F331 Major depressive disorder, recurrent, moderate: Secondary | ICD-10-CM | POA: Diagnosis not present

## 2022-05-04 DIAGNOSIS — F902 Attention-deficit hyperactivity disorder, combined type: Secondary | ICD-10-CM | POA: Diagnosis not present

## 2022-05-08 DIAGNOSIS — O219 Vomiting of pregnancy, unspecified: Secondary | ICD-10-CM | POA: Diagnosis not present

## 2022-05-08 DIAGNOSIS — Z3689 Encounter for other specified antenatal screening: Secondary | ICD-10-CM | POA: Diagnosis not present

## 2022-05-08 DIAGNOSIS — O09299 Supervision of pregnancy with other poor reproductive or obstetric history, unspecified trimester: Secondary | ICD-10-CM | POA: Diagnosis not present

## 2022-05-08 DIAGNOSIS — Z3201 Encounter for pregnancy test, result positive: Secondary | ICD-10-CM | POA: Diagnosis not present

## 2022-05-08 DIAGNOSIS — O99210 Obesity complicating pregnancy, unspecified trimester: Secondary | ICD-10-CM | POA: Diagnosis not present

## 2022-05-08 DIAGNOSIS — Z3A00 Weeks of gestation of pregnancy not specified: Secondary | ICD-10-CM | POA: Diagnosis not present

## 2022-05-08 DIAGNOSIS — N912 Amenorrhea, unspecified: Secondary | ICD-10-CM | POA: Diagnosis not present

## 2022-05-08 DIAGNOSIS — E669 Obesity, unspecified: Secondary | ICD-10-CM | POA: Diagnosis not present

## 2022-05-08 DIAGNOSIS — Z8759 Personal history of other complications of pregnancy, childbirth and the puerperium: Secondary | ICD-10-CM | POA: Diagnosis not present

## 2022-05-08 DIAGNOSIS — O99211 Obesity complicating pregnancy, first trimester: Secondary | ICD-10-CM | POA: Diagnosis not present

## 2022-05-08 DIAGNOSIS — Z3A08 8 weeks gestation of pregnancy: Secondary | ICD-10-CM | POA: Diagnosis not present

## 2022-05-08 DIAGNOSIS — O3680X0 Pregnancy with inconclusive fetal viability, not applicable or unspecified: Secondary | ICD-10-CM | POA: Diagnosis not present

## 2022-05-11 DIAGNOSIS — F902 Attention-deficit hyperactivity disorder, combined type: Secondary | ICD-10-CM | POA: Diagnosis not present

## 2022-05-11 DIAGNOSIS — F900 Attention-deficit hyperactivity disorder, predominantly inattentive type: Secondary | ICD-10-CM | POA: Diagnosis not present

## 2022-05-11 DIAGNOSIS — F331 Major depressive disorder, recurrent, moderate: Secondary | ICD-10-CM | POA: Diagnosis not present

## 2022-05-11 DIAGNOSIS — F411 Generalized anxiety disorder: Secondary | ICD-10-CM | POA: Diagnosis not present

## 2022-05-18 DIAGNOSIS — F902 Attention-deficit hyperactivity disorder, combined type: Secondary | ICD-10-CM | POA: Diagnosis not present

## 2022-05-18 DIAGNOSIS — F331 Major depressive disorder, recurrent, moderate: Secondary | ICD-10-CM | POA: Diagnosis not present

## 2022-05-18 DIAGNOSIS — F411 Generalized anxiety disorder: Secondary | ICD-10-CM | POA: Diagnosis not present

## 2022-05-19 DIAGNOSIS — O219 Vomiting of pregnancy, unspecified: Secondary | ICD-10-CM | POA: Diagnosis not present

## 2022-05-19 DIAGNOSIS — Z3A10 10 weeks gestation of pregnancy: Secondary | ICD-10-CM | POA: Diagnosis not present

## 2022-05-19 DIAGNOSIS — L309 Dermatitis, unspecified: Secondary | ICD-10-CM | POA: Diagnosis not present

## 2022-05-19 DIAGNOSIS — F32A Depression, unspecified: Secondary | ICD-10-CM | POA: Diagnosis not present

## 2022-05-19 DIAGNOSIS — F909 Attention-deficit hyperactivity disorder, unspecified type: Secondary | ICD-10-CM | POA: Diagnosis not present

## 2022-05-19 DIAGNOSIS — F419 Anxiety disorder, unspecified: Secondary | ICD-10-CM | POA: Diagnosis not present

## 2022-05-19 DIAGNOSIS — O99711 Diseases of the skin and subcutaneous tissue complicating pregnancy, first trimester: Secondary | ICD-10-CM | POA: Diagnosis not present

## 2022-05-19 DIAGNOSIS — O99341 Other mental disorders complicating pregnancy, first trimester: Secondary | ICD-10-CM | POA: Diagnosis not present

## 2022-05-19 DIAGNOSIS — R072 Precordial pain: Secondary | ICD-10-CM | POA: Diagnosis not present

## 2022-05-22 ENCOUNTER — Telehealth: Payer: Self-pay

## 2022-05-22 NOTE — Transitions of Care (Post Inpatient/ED Visit) (Signed)
   05/22/2022  Name: Zoe Hunter MRN: [id number] DOB: 05-16-1995  Today's TOC FU Call Status: Today's TOC FU Call Status:: Unsuccessul Call (1st Attempt) Unsuccessful Call (1st Attempt) Date: 05/22/22  Attempted to reach the patient regarding the most recent Inpatient/ED visit.  Follow Up Plan: Additional outreach attempts will be made to reach the patient to complete the Transitions of Care (Post Inpatient/ED visit) call.   Signature Berton Mount

## 2022-05-22 NOTE — Transitions of Care (Post Inpatient/ED Visit) (Signed)
   05/22/2022  Name: Zoe Hunter MRN: [id number] DOB: 1995-06-10  Today's TOC FU Call Status: Today's TOC FU Call Status:: Successful TOC FU Call Competed TOC FU Call Complete Date: 05/22/22  Transition Care Management Follow-up Telephone Call Date of Discharge: 05/19/22 Type of Discharge: Emergency Department Reason for ED Visit: Other: (N/V) How have you been since you were released from the hospital?: Same Any questions or concerns?: Yes Patient Questions/Concerns:: Can't eat Patient Questions/Concerns Addressed: Other: (advised to call her OBGYN- Dr Adah Perl)  Items Reviewed: Did you receive and understand the discharge instructions provided?: Yes Medications obtained and verified?: Yes (Medications Reviewed) Any new allergies since your discharge?: No Dietary orders reviewed?: NA Do you have support at home?: Yes People in Home: significant other Name of Support/Comfort Primary Source: [facility] and Equipment/Supplies: [place] Ordered?: NA Any new equipment or medical supplies ordered?: NA  Functional Questionnaire: Do you need assistance with bathing/showering or dressing?: No Do you need assistance with meal preparation?: No Do you need assistance with eating?: No Do you have difficulty maintaining continence: No Do you need assistance with getting out of bed/getting out of a chair/moving?: No Do you have difficulty managing or taking your medications?: No  Follow up appointments reviewed: PCP Follow-up appointment confirmed?: NA (follow up with GYN d/t preg related N/V) [facility] Follow-up appointment confirmed?: No Reason Specialist Follow-Up Not Confirmed: Patient has Specialist Provider Number and will Call for Appointment Do you need transportation to your follow-up appointment?: No Do you understand care options if your condition(s) worsen?: Yes-patient verbalized understanding    SIGNATURE Berton Mount

## 2022-05-25 DIAGNOSIS — F902 Attention-deficit hyperactivity disorder, combined type: Secondary | ICD-10-CM | POA: Diagnosis not present

## 2022-05-25 DIAGNOSIS — F411 Generalized anxiety disorder: Secondary | ICD-10-CM | POA: Diagnosis not present

## 2022-05-27 ENCOUNTER — Observation Stay
Admission: EM | Admit: 2022-05-27 | Discharge: 2022-05-28 | Disposition: A | Discharge status: Home / Self Care | Payer: Medicaid Other | Attending: Obstetrics | Admitting: Obstetrics

## 2022-05-27 ENCOUNTER — Other Ambulatory Visit: Payer: Self-pay

## 2022-05-27 ENCOUNTER — Encounter: Payer: Self-pay | Admitting: Intensive Care

## 2022-05-27 ENCOUNTER — Emergency Department: Payer: Medicaid Other

## 2022-05-27 DIAGNOSIS — O26891 Other specified pregnancy related conditions, first trimester: Secondary | ICD-10-CM

## 2022-05-27 DIAGNOSIS — M545 Low back pain, unspecified: Secondary | ICD-10-CM | POA: Diagnosis present

## 2022-05-27 DIAGNOSIS — O211 Hyperemesis gravidarum with metabolic disturbance: Secondary | ICD-10-CM | POA: Diagnosis not present

## 2022-05-27 DIAGNOSIS — R102 Pelvic and perineal pain: Secondary | ICD-10-CM | POA: Diagnosis not present

## 2022-05-27 DIAGNOSIS — O210 Mild hyperemesis gravidarum: Secondary | ICD-10-CM | POA: Diagnosis not present

## 2022-05-27 DIAGNOSIS — Z3A12 12 weeks gestation of pregnancy: Secondary | ICD-10-CM | POA: Diagnosis not present

## 2022-05-27 DIAGNOSIS — Z3A11 11 weeks gestation of pregnancy: Secondary | ICD-10-CM | POA: Diagnosis not present

## 2022-05-27 DIAGNOSIS — R109 Unspecified abdominal pain: Secondary | ICD-10-CM | POA: Diagnosis not present

## 2022-05-27 DIAGNOSIS — M25559 Pain in unspecified hip: Secondary | ICD-10-CM | POA: Diagnosis not present

## 2022-05-27 DIAGNOSIS — O26899 Other specified pregnancy related conditions, unspecified trimester: Secondary | ICD-10-CM

## 2022-05-27 DIAGNOSIS — R100 Acute abdomen: Secondary | ICD-10-CM | POA: Diagnosis not present

## 2022-05-27 DIAGNOSIS — R1030 Lower abdominal pain, unspecified: Secondary | ICD-10-CM | POA: Diagnosis not present

## 2022-05-27 LAB — URINALYSIS, W/ REFLEX TO CULTURE (INFECTION SUSPECTED)
Bilirubin Urine: NEGATIVE
Glucose, UA: NEGATIVE mg/dL
Hgb urine dipstick: NEGATIVE
Ketones, ur: 20 mg/dL — AB
Nitrite: NEGATIVE
Protein, ur: NEGATIVE mg/dL
Specific Gravity, Urine: 1.016 (ref 1.005–1.030)
Squamous Epithelial / HPF: NONE SEEN /HPF (ref 0–5)
WBC, UA: NONE SEEN WBC/hpf (ref 0–5)
pH: 7 (ref 5.0–8.0)

## 2022-05-27 LAB — CBC WITH DIFFERENTIAL/PLATELET
Abs Immature Granulocytes: 0.04 10*3/uL (ref 0.00–0.07)
Basophils Absolute: 0 10*3/uL (ref 0.0–0.1)
Basophils Relative: 0 %
Eosinophils Absolute: 0 10*3/uL (ref 0.0–0.5)
Eosinophils Relative: 0 %
HCT: 38.1 % (ref 36.0–46.0)
Hemoglobin: 12 g/dL (ref 12.0–15.0)
Immature Granulocytes: 0 %
Lymphocytes Relative: 15 %
Lymphs Abs: 2 10*3/uL (ref 0.7–4.0)
MCH: 24.8 pg — ABNORMAL LOW (ref 26.0–34.0)
MCHC: 31.5 g/dL (ref 30.0–36.0)
MCV: 78.9 fL — ABNORMAL LOW (ref 80.0–100.0)
Monocytes Absolute: 0.4 10*3/uL (ref 0.1–1.0)
Monocytes Relative: 3 %
Neutro Abs: 10.4 10*3/uL — ABNORMAL HIGH (ref 1.7–7.7)
Neutrophils Relative %: 82 %
Platelets: 357 10*3/uL (ref 150–400)
RBC: 4.83 MIL/uL (ref 3.87–5.11)
RDW: 14.7 % (ref 11.5–15.5)
WBC: 12.9 10*3/uL — ABNORMAL HIGH (ref 4.0–10.5)
nRBC: 0 % (ref 0.0–0.2)

## 2022-05-27 LAB — COMPREHENSIVE METABOLIC PANEL
ALT: 14 U/L (ref 0–44)
AST: 18 U/L (ref 15–41)
Albumin: 3.9 g/dL (ref 3.5–5.0)
Alkaline Phosphatase: 76 U/L (ref 38–126)
Anion gap: 13 (ref 5–15)
BUN: 6 mg/dL (ref 6–20)
CO2: 21 mmol/L — ABNORMAL LOW (ref 22–32)
Calcium: 9.5 mg/dL (ref 8.9–10.3)
Chloride: 101 mmol/L (ref 98–111)
Creatinine, Ser: 0.49 mg/dL (ref 0.44–1.00)
GFR, Estimated: >60 mL/min (ref >60)
Glucose, Bld: 99 mg/dL (ref 70–99)
Potassium: 3.3 mmol/L — ABNORMAL LOW (ref 3.5–5.1)
Sodium: 135 mmol/L (ref 135–145)
Total Bilirubin: 0.4 mg/dL (ref 0.3–1.2)
Total Protein: 8.3 g/dL — ABNORMAL HIGH (ref 6.5–8.1)

## 2022-05-27 LAB — HCG, QUANTITATIVE, PREGNANCY: hCG, Beta Chain, Quant, S: 80733 m[IU]/mL — ABNORMAL HIGH (ref <5)

## 2022-05-27 LAB — LIPASE, BLOOD: Lipase: 30 U/L (ref 11–51)

## 2022-05-27 MED ORDER — LACTATED RINGERS IV BOLUS
1000.0000 mL | Freq: Once | INTRAVENOUS | Status: AC
  Administered 2022-05-27: 1000 mL via INTRAVENOUS

## 2022-05-27 MED ORDER — SODIUM CHLORIDE 0.9 % IV BOLUS
1000.0000 mL | Freq: Once | INTRAVENOUS | Status: AC
  Administered 2022-05-27: 1000 mL via INTRAVENOUS

## 2022-05-27 MED ORDER — ONDANSETRON HCL 4 MG/2ML IJ SOLN
INTRAMUSCULAR | Status: AC
  Filled 2022-05-27: qty 2

## 2022-05-27 MED ORDER — MORPHINE SULFATE (PF) 2 MG/ML IV SOLN
2.0000 mg | Freq: Once | INTRAVENOUS | Status: AC
  Administered 2022-05-27: 2 mg via INTRAVENOUS
  Filled 2022-05-27: qty 1

## 2022-05-27 MED ORDER — SODIUM CHLORIDE 0.9 % IV SOLN
1.0000 g | Freq: Once | INTRAVENOUS | Status: AC
  Administered 2022-05-28: 1 g via INTRAVENOUS
  Filled 2022-05-27: qty 10

## 2022-05-27 MED ORDER — METOCLOPRAMIDE HCL 5 MG/ML IJ SOLN
5.0000 mg | Freq: Once | INTRAMUSCULAR | Status: AC
  Administered 2022-05-27: 5 mg via INTRAVENOUS
  Filled 2022-05-27: qty 2

## 2022-05-27 MED ORDER — HYDROMORPHONE HCL 1 MG/ML IJ SOLN
0.5000 mg | Freq: Once | INTRAMUSCULAR | Status: AC
  Administered 2022-05-27: 0.5 mg via INTRAVENOUS
  Filled 2022-05-27: qty 0.5

## 2022-05-27 MED ORDER — SODIUM CHLORIDE 0.9 % IV SOLN
12.5000 mg | Freq: Once | INTRAVENOUS | Status: AC
  Administered 2022-05-27: 12.5 mg via INTRAVENOUS
  Filled 2022-05-27: qty 12.5

## 2022-05-27 MED ORDER — ONDANSETRON 4 MG PO TBDP
4.0000 mg | ORAL_TABLET | Freq: Once | ORAL | Status: DC
  Filled 2022-05-27: qty 1

## 2022-05-27 MED ORDER — MORPHINE SULFATE (PF) 4 MG/ML IV SOLN
4.0000 mg | Freq: Once | INTRAVENOUS | Status: AC
  Administered 2022-05-27: 4 mg via INTRAVENOUS
  Filled 2022-05-27: qty 1

## 2022-05-27 NOTE — ED Notes (Signed)
Patient states she is still vomiting. Patient states she is unable to void on the bedpan. Dr. Charna Archer aware.

## 2022-05-27 NOTE — ED Provider Notes (Signed)
-----------------------------------------   7:48 PM on 05/27/2022 -----------------------------------------  Blood pressure 125/77, pulse 65, temperature 99.1 F (37.3 C), temperature source Oral, resp. rate 16, height 5\' 3"  (1.6 m), weight 108.9 kg, last menstrual period 11/16/2021, SpO2 100 %.  Assuming care from Dr. Charna Archer.  In short, Zoe Hunter is a 27 y.o. female with a chief complaint of Pelvic Pain and Back Pain .  Refer to the original H&P for additional details.  The current plan of care is to await MRI of her abdomen pelvis and hip.  Patient presented to the emergency department pregnant, complaining of severe pelvic and hip pain.  Patient has reassuring labs, elevated hCG consistent with pregnancy history.  Ultrasounds are reassuring without acute finding.  However patient still endorses severe pain, ongoing nausea and vomiting.  Awaiting MRI results.     ----------------------------------------- 11:32 PM on 05/27/2022 ----------------------------------------- Patient continues to have ongoing severe pain.  Has required multiple rounds of pain medication and antiemetics.  Patient's pain and nausea will settle down for a time after medications then return.  Patient was seen initially with lower pelvic pain not really localized, now more localized on the left side.  Patient has had no associated GI symptoms such as diarrhea or constipation.  No vaginal bleeding or discharge.  No dysuria, polyuria, hematuria.  She has had some nausea vomiting which she stated started after the pain.  At this time patient has had labs with a slight elevation in her white blood cell count, asymptomatic bacteriuria on urinalysis, OB and renal ultrasounds were reassuring.  Given the amount of pain wanted to ensure no other intra-abdominal findings and MRI of the abdomen pelvis was performed without acute finding.  At this time patient continues to have severe symptoms and I will reach out to OB/GYN service for  admission.  I discussed the patient with OB/GYN, as of this time we will admit for pain control and further evaluation though there is not a well-defined reason for the patient's abdominal pain.  Patient care will be transferred to the OB/GYN service at this time.   ED diagnosis:  Abdominal pain in pregnancy Pelvic pain in pregnancy   Brynda Peon 05/27/22 [id number]    Blake Divine, MD 05/28/22 [id number]

## 2022-05-27 NOTE — ED Notes (Signed)
Lab called twice to add-on hcg.  Appropriate tube is in lab.

## 2022-05-27 NOTE — ED Notes (Signed)
Ultrasound at bedside

## 2022-05-27 NOTE — H&P (Signed)
HISTORY AND PHYSICAL NOTE  History of Present Illness: Zoe Hunter  is a 27 y.o. G2P1001at [redacted]w[redacted]d admitted for intractable abdominal and hip pain x24hrs, nausea and vomiting in early pregnancy.  She presented to the ER with complaints of ongoing severe pain, relieved with iv pain meds only but returning. She has been in the ER for most of the day without an obvious etiology found.  She has no associated GI symptoms such as diarrhea or constipation. No vaginal bleeding or discharge. No dysuria, polyuria, hematuria. She has had some nausea vomiting which she stated started after the pain. At this time patient has had labs with a slight elevation in her white blood cell count, asymptomatic bacteriuria on urinalysis, OB and renal ultrasounds were reassuring. MRI orders without acute findings, but no appendicitis. She has already had a cholecystectomy.  She has tried unisom and zofran as an outpatient to help with N/V but has not been effective.  She denies cramping, vaginal bleeding, or LOF.  Has not yet been able to feel fetal movement yet.   Pregnancy care at [facility] in [place].  Factors complicating pregnancy:  -History of preeclampsia -Obesity in pregnancy, pregravid BMI 43 -History of hyperemesis gravidarum, she states she is having some nausea but not severe. She is currently using B6 50 mg twice a day and Unisom 25 mg at night. She does request to have Zofran on hand if her symptoms were to worsen. -She does have a history of depression, anxiety she has a history of depression, anxiety, bipolar disorder, PTSD.She is not currently on any medication but is working with her therapist weekly which she finds very beneficial.   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   Nausea/vomiting in pregnancy 08/29/2018   LGSIL on Pap smear of cervix 05/24/2017   ADD (attention deficit disorder) 02/19/2015   Bipolar affective disorder ([place]) 02/19/2015   Clinical depression 02/19/2015   Cholelithiasis 01/28/2015    @PROB @ Past Medical History:  Diagnosis Date   ADHD    ADHD (attention deficit hyperactivity disorder)    Anxiety    Bipolar disorder ([place])    Cholelithiasis 01/2015   Depression    GERD (gastroesophageal reflux disease)     Past Medical History:  Diagnosis Date   ADHD    ADHD (attention deficit hyperactivity disorder)    Anxiety    Bipolar disorder ([place])    Cholelithiasis 01/2015   Depression    GERD (gastroesophageal reflux disease)       Past Surgical History:  Procedure Laterality Date   CHOLECYSTECTOMY N/A 02/16/2015   Procedure: LAPAROSCOPIC CHOLECYSTECTOMY;  Surgeon: Sherri Rad, MD;  Location: [facility] ORS;  Service: General;  Laterality: N/A;   ESOPHAGOGASTRODUODENOSCOPY (EGD) WITH PROPOFOL N/A 05/05/2016   Procedure: ESOPHAGOGASTRODUODENOSCOPY (EGD) WITH PROPOFOL;  Surgeon: Lollie Sails, MD;  Location: [facility] ENDOSCOPY;  Service: Endoscopy;  Laterality: N/A;   TONSILLECTOMY     WISDOM TOOTH EXTRACTION       OB History  Gravida Para Term Preterm AB Living  2 1   1   1   SAB IAB  Ectopic Multiple Live Births          1    # Outcome Date GA Lbr Len/2nd Weight Sex Delivery Anes PTL Lv  2 Current           1 Preterm      Vag-Spont  N LIV     Social History:  reports that she has never smoked. She has never used smokeless tobacco. She reports current drug use. Drug: Marijuana. She reports that she does not drink alcohol.   Family History: family history includes Cancer in her mother and another family member; Heart disease in her  maternal grandfather; Hypertension in her maternal grandfather, maternal grandmother, and mother.   No Known Allergies  (Not in a hospital admission)   Review of Systems  Gastrointestinal:  Positive for nausea and vomiting.  All other systems reviewed and are negative.    Physical Examination: Vitals:  @VS @ BP 125/77 (BP Location: Left Arm)   Pulse 65   Temp 99.4 F (37.4 C) (Oral)   Resp 16   Ht 5\' 3"  (1.6 m)   Wt 108.9 kg   LMP 11/16/2021 (Exact Date)   SpO2 100%   BMI 42.51 kg/m   General: no acute distress.  HEENT: normocephalic, atraumatic Heart: regular rate & rhythm.  No murmurs/rubs/gallops Lungs: clear to auscultation bilaterally, normal respiratory effort Abdomen: soft, gravid, c/w with 11 weeks week gestation, non-tender  Pelvic: deferred  Extremities: non-tender, symmetric, no edema bilaterally.  DTRs: +2  Neurologic: Alert & oriented x 3.    FHT by doppler:  140's bpm distinguished from maternal pulse   Labs:  Recent Results (from the past 2160 hour(s))  CBC with Differential     Status: Abnormal   Collection Time: 05/27/22  1:56 PM  Result Value Ref Range   WBC 12.9 (H) 4.0 - 10.5 K/uL   RBC 4.83 3.87 - 5.11 MIL/uL   Hemoglobin 12.0 12.0 - 15.0 g/dL   HCT 38.1 36.0 - 46.0 %   MCV 78.9 (L) 80.0 - 100.0 fL   MCH 24.8 (L) 26.0 - 34.0 pg   MCHC 31.5 30.0 - 36.0 g/dL   RDW 14.7 11.5 - 15.5 %   Platelets 357 150 - 400 K/uL   nRBC 0.0 0.0 - 0.2 %   Neutrophils Relative % 82 %   Neutro Abs 10.4 (H) 1.7 - 7.7 K/uL   Lymphocytes Relative 15 %   Lymphs Abs 2.0 0.7 - 4.0 K/uL   Monocytes Relative 3 %   Monocytes Absolute 0.4 0.1 - 1.0 K/uL   Eosinophils Relative 0 %   Eosinophils Absolute 0.0 0.0 - 0.5 K/uL   Basophils Relative 0 %   Basophils Absolute 0.0 0.0 - 0.1 K/uL   Immature Granulocytes 0 %   Abs Immature Granulocytes 0.04 0.00 - 0.07 K/uL    Comment: Performed at [facility], [address] [place], [place] [address]   Comprehensive metabolic panel     Status: Abnormal   Collection Time: 05/27/22  1:56 PM  Result Value Ref Range   Sodium 135 135 - 145 mmol/L   Potassium 3.3 (L) 3.5 - 5.1 mmol/L   Chloride 101 98 - 111 mmol/L   CO2 21 (L) 22 - 32 mmol/L   Glucose, Bld 99 70 - 99 mg/dL    Comment: Glucose reference range applies only to samples taken after fasting for at least 8 hours.   BUN 6 6 - 20 mg/dL   Creatinine, Ser  0.49 0.44 - 1.00 mg/dL   Calcium 9.5 8.9 - 10.3 mg/dL   Total Protein 8.3 (H) 6.5 - 8.1 g/dL   Albumin 3.9 3.5 - 5.0 g/dL   AST 18 15 - 41 U/L   ALT 14 0 - 44 U/L   Alkaline Phosphatase 76 38 - 126 U/L   Total Bilirubin 0.4 0.3 - 1.2 mg/dL   GFR, Estimated >60 >60 mL/min    Comment: (NOTE) Calculated using the CKD-EPI Creatinine Equation (2021)    Anion gap 13 5 - 15    Comment: Performed at [facility], [address] [place], [place] [address]  hCG, quantitative, pregnancy     Status: Abnormal   Collection Time: 05/27/22  1:56 PM  Result Value Ref Range   hCG, Beta Chain, Quant, S 80,733 (H) <5 mIU/mL    Comment:          GEST. AGE      CONC.  (mIU/mL)   <=1 WEEK        5 - 50     2 WEEKS       50 - 500     3 WEEKS       100 - 10,000     4 WEEKS     1,000 - 30,000     5 WEEKS     3,500 - 115,000   6-8 WEEKS     12,000 - 270,000    12 WEEKS     15,000 - 220,000        FEMALE AND NON-PREGNANT FEMALE:     LESS THAN 5 mIU/mL Performed at [facility], [address] [place], [place] [address]   Lipase, blood     Status: None   Collection Time: 05/27/22  1:56 PM  Result Value Ref Range   Lipase 30 11 - 51 U/L    Comment: Performed at [facility], [address] [place], [place] [address]  Urinalysis, w/ Reflex to Culture (Infection Suspected) -Urine, Clean Catch     Status: Abnormal   Collection Time: 05/27/22  6:45 PM  Result Value Ref Range   Specimen Source URINE, CLEAN CATCH    Color, Urine YELLOW (A) YELLOW   APPearance  TURBID (A) CLEAR   Specific Gravity, Urine 1.016 1.005 - 1.030   pH 7.0 5.0 - 8.0   Glucose, UA NEGATIVE NEGATIVE mg/dL   Hgb urine dipstick NEGATIVE NEGATIVE   Bilirubin Urine NEGATIVE NEGATIVE   Ketones, ur 20 (A) NEGATIVE mg/dL   Protein, ur NEGATIVE NEGATIVE mg/dL   Nitrite NEGATIVE NEGATIVE   Leukocytes,Ua TRACE (A) NEGATIVE   RBC / HPF 0-5 0 - 5 RBC/hpf   WBC, UA NONE SEEN 0 - 5 WBC/hpf    Comment:        Reflex urine culture not performed if WBC <=10, OR if Squamous epithelial cells >5. If Squamous epithelial cells >5 suggest recollection.    Bacteria, UA RARE (A) NONE SEEN   Squamous Epithelial / HPF NONE SEEN 0 - 5 /HPF   Mucus PRESENT     Comment: Performed at [facility], [address] [place], [place] [address]     Imaging Studies: MR ABDOMEN WO CONTRAST  Result Date: 05/27/2022 CLINICAL DATA:  Pregnant appendicitis suspected, pelvic, lower back and side pain since last night, [redacted] weeks pregnant EXAM: MRI ABDOMEN AND PELVIS WITHOUT CONTRAST TECHNIQUE: Multiplanar multisequence MR imaging of the abdomen and pelvis was performed. No intravenous contrast was  administered. COMPARISON:  None Available. FINDINGS: COMBINED FINDINGS FOR BOTH MR ABDOMEN AND PELVIS Lower chest: No acute abnormality. Hepatobiliary: No focal liver abnormality is seen. Gallbladder is extremely contracted or surgically absent. No biliary dilatation. Pancreas: Unremarkable. No pancreatic ductal dilatation or surrounding inflammatory changes. Spleen: Normal in size without significant abnormality. Adrenals/Urinary Tract: Adrenal glands are unremarkable. Kidneys are normal, without obvious renal calculi, solid lesion, or hydronephrosis. Bladder is unremarkable. Stomach/Bowel: Stomach is within normal limits. Appendix appears normal. No evidence of bowel wall thickening, distention, or inflammatory changes. Vascular/Lymphatic: No significant vascular findings are present. No enlarged abdominal or  pelvic lymph nodes. Reproductive: Gravid uterus. Anterior placenta. No obvious abnormality of the fetus on this examination which is not tailored for the evaluation of fetal anatomy. Other: No abdominal wall hernia or abnormality. No ascites. Musculoskeletal: No acute or significant osseous findings. IMPRESSION: 1. No noncontrast MR findings explain abdominal pain. Normal appendix. 2. Gravid uterus. Anterior placenta. No obvious abnormality of the fetus on this examination which is not tailored for the evaluation of fetal anatomy. 3. Gallbladder is extremely contracted or surgically absent. No biliary dilatation. Electronically Signed   By: Delanna Ahmadi M.D.   On: 05/27/2022 22:43   MR PELVIS WO CONTRAST  Result Date: 05/27/2022 CLINICAL DATA:  Pregnant appendicitis suspected, pelvic, lower back and side pain since last night, [redacted] weeks pregnant EXAM: MRI ABDOMEN AND PELVIS WITHOUT CONTRAST TECHNIQUE: Multiplanar multisequence MR imaging of the abdomen and pelvis was performed. No intravenous contrast was administered. COMPARISON:  None Available. FINDINGS: COMBINED FINDINGS FOR BOTH MR ABDOMEN AND PELVIS Lower chest: No acute abnormality. Hepatobiliary: No focal liver abnormality is seen. Gallbladder is extremely contracted or surgically absent. No biliary dilatation. Pancreas: Unremarkable. No pancreatic ductal dilatation or surrounding inflammatory changes. Spleen: Normal in size without significant abnormality. Adrenals/Urinary Tract: Adrenal glands are unremarkable. Kidneys are normal, without obvious renal calculi, solid lesion, or hydronephrosis. Bladder is unremarkable. Stomach/Bowel: Stomach is within normal limits. Appendix appears normal. No evidence of bowel wall thickening, distention, or inflammatory changes. Vascular/Lymphatic: No significant vascular findings are present. No enlarged abdominal or pelvic lymph nodes. Reproductive: Gravid uterus. Anterior placenta. No obvious abnormality of the  fetus on this examination which is not tailored for the evaluation of fetal anatomy. Other: No abdominal wall hernia or abnormality. No ascites. Musculoskeletal: No acute or significant osseous findings. IMPRESSION: 1. No noncontrast MR findings explain abdominal pain. Normal appendix. 2. Gravid uterus. Anterior placenta. No obvious abnormality of the fetus on this examination which is not tailored for the evaluation of fetal anatomy. 3. Gallbladder is extremely contracted or surgically absent. No biliary dilatation. Electronically Signed   By: Delanna Ahmadi M.D.   On: 05/27/2022 22:43   US Renal  Result Date: 05/27/2022 CLINICAL DATA:  One day history of flank pain in pregnancy EXAM: RENAL / URINARY TRACT ULTRASOUND COMPLETE COMPARISON:  None Available. FINDINGS: Right Kidney: Length = 13.4 cm AP renal pelvis diameter = <10 mm Normal parenchymal echogenicity with preserved corticomedullary differentiation. No urinary tract dilation or shadowing calculi. The ureter is not seen. Left Kidney: Length = 10.8 cm AP renal pelvis diameter = <10 mm Normal parenchymal echogenicity with preserved corticomedullary differentiation. No urinary tract dilation or shadowing calculi. The ureter is not seen. Bladder: Appears normal for degree of bladder distention. Other: Incidentally noted gravid uterus. IMPRESSION: 1. No urinary tract dilation or shadowing calculi. 2. Renal size discrepancy may be related to technique. Electronically Signed   By: Shawn Route.D.  On: 05/27/2022 17:38   US OB Comp Less 14 Wks  Result Date: 05/27/2022 CLINICAL DATA:  Pelvic pain EXAM: OBSTETRIC <14 WK ULTRASOUND TECHNIQUE: Transabdominal ultrasound was performed for evaluation of the gestation as well as the maternal uterus and adnexal regions. COMPARISON:  None Available. FINDINGS: Intrauterine gestational sac: Single Yolk sac:  Not visualized Embryo:  Visualized Cardiac Activity: Visualized Heart Rate: 158 bpm MSD:    mm    w     d CRL:    55.5 mm   12 w 1 d                  [place] EDC: 12/08/2022 Subchorionic hemorrhage:  None visualized. Maternal uterus/adnexae: No adnexal mass or free fluid. IMPRESSION: Twelve week 1 day intrauterine pregnancy. Fetal heart rate 158 beats per minute. No acute maternal findings. Electronically Signed   By: Rolm Baptise M.D.   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 Admit to Antenatal for monitoring, pain control and hydration.   - hyperemesis protocol initiated   Medications:  2L LR over 3-5 hours Give 100mg  thiamine with initial LR bolus Give iv MVI with initial bolus, then daily Correct electrolytes if needed  IVF of D5LR with 20 mEq of KCL at 17ml/hr: titrate to 129ml of urine an hour  8mg  iv zofran q8 hrs 25mg  iv phenergen q8 hrs - NPO - advance diet as tolerated  - Maintain IV fluids and IV electrolyte replacement  - Scheduled IV/PO antiemetics ordered with PRN IV/PO/PR antiemetics for breakthrough N/V  - Daily weight  - Q4 hour intake/output   2. Routine antenatal care  - Activity as tolerated  - Bathroom privileges  - will start 81mg  ASA when tolerating po  3. Pain control: - will continue iv meds for pain control as ER - 0.5mg  iv dilaudid q2-3 hrs prn  Benjaman Kindler [id number] AM [id number]  Avelino Leeds  Certified Nurse Midwife [facility]

## 2022-05-27 NOTE — ED Provider Notes (Signed)
[facility] Emergency Department Provider Note   ____________________________________________   Event Date/Time   First MD Initiated Contact with Patient 05/27/22 1322     (approximate)  I have reviewed the triage vital signs and the nursing notes.   HISTORY  Chief Complaint Pelvic Pain and Back Pain    HPI Zoe Hunter is a 27 y.o. female presents to the emergency room for complaint of bilateral hip and pelvic pain for the past 24 hours.  Patient reports that the pain is constant and sharp/stabbing with intermittent cramping.  Patient is approximately [redacted] weeks pregnant.  She denies vaginal bleeding or leakage of fluid.  Patient does endorse that she has pain and burning with urination.  She continues to have nausea/vomiting.  She denies abdominal pain/diarrhea/body aches. Patient was seen in the emergency room at [facility] on 05/19/2022 for vomiting during pregnancy.  She was given over-the-counter Zofran upon release.  Her labs were relatively reassuring other than slight signs of dehydration.  She was to follow-up with her OB/GYN provider.  She has not established with this plan as of yet. Patient reports that she is staying adequately hydrated. Patient has not taken anything for her pain at this time.   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   GBS bacteriuria 09/10/2018   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    Nausea/vomiting in pregnancy 08/29/2018   LGSIL on Pap smear of cervix 05/24/2017   ADD (attention deficit disorder) 02/19/2015   Bipolar affective disorder ([place]) 02/19/2015   Clinical depression 02/19/2015   Cholelithiasis 01/28/2015    Past Surgical History:  Procedure Laterality Date   CHOLECYSTECTOMY N/A 02/16/2015   Procedure: LAPAROSCOPIC CHOLECYSTECTOMY;  Surgeon: Sherri Rad, MD;  Location: [facility] ORS;  Service: General;  Laterality: N/A;   ESOPHAGOGASTRODUODENOSCOPY (EGD) WITH PROPOFOL N/A 05/05/2016   Procedure: ESOPHAGOGASTRODUODENOSCOPY (EGD) WITH PROPOFOL;  Surgeon: Lollie Sails, MD;  Location: [facility] ENDOSCOPY;  Service: Endoscopy;  Laterality: N/A;   TONSILLECTOMY     WISDOM TOOTH EXTRACTION      Prior to Admission medications   Medication Sig Start Date End Date Taking? Authorizing Provider  amphetamine-dextroamphetamine (ADDERALL) 30 MG tablet Take 30 mg by mouth daily. cBC Patient not taking: Reported on 05/22/2022    [provider]  ergocalciferol (VITAMIN D2) 1.25 MG (50000 UT) capsule Take 1 capsule by mouth once a week. Patient not taking: Reported on 05/22/2022 07/29/20   [provider]  erythromycin ophthalmic ointment Place 1 Application into the left eye 3 (three) times daily. Apply to effected eye three times a day Patient not taking: Reported on 05/22/2022 11/23/21   Juline Patch, MD    Allergies Patient has no known allergies.  Family History  Problem Relation Age of Onset   Hypertension Mother    Cancer Mother    Hypertension Maternal Grandmother    Cancer Other    Hypertension Maternal Grandfather  Heart disease Maternal Grandfather     Social History Social History   Tobacco Use   Smoking status: Never   Smokeless tobacco: Never   Tobacco comments:    boyfriend smokes  Vaping Use   Vaping Use: Every day  Substance Use Topics   Alcohol use: No   Drug use: Yes    Types: Marijuana    Review of  Systems  Constitutional: No fever/chills Cardiovascular: Denies chest pain. Respiratory: Denies shortness of breath. Gastrointestinal: No abdominal pain.  No nausea, no vomiting.  No diarrhea.  No constipation. Genitourinary: Pain and burning with urination.  Bilateral hip and pelvic pain. Musculoskeletal: Negative for back pain. Skin: Negative for rash. Neurological: Negative for headaches, focal weakness or numbness.   ____________________________________________   PHYSICAL EXAM:  VITAL SIGNS: ED Triage Vitals  Enc Vitals Group     BP 05/27/22 1317 130/76     Pulse Rate 05/27/22 1317 96     Resp 05/27/22 1317 20     Temp 05/27/22 1317 99.1 F (37.3 C)     Temp Source 05/27/22 1317 Oral     SpO2 05/27/22 1317 97 %     Weight 05/27/22 1315 240 lb (108.9 kg)     Height 05/27/22 1315 5\' 3"  (1.6 m)     Head Circumference --      Peak Flow --      Pain Score 05/27/22 1315 8     Pain Loc --      Pain Edu? --      Excl. in [place]? --     Constitutional: Alert and oriented. Well appearing -patient is unable to stay still due to pain.  She is rotating position constantly in bed. Eyes: Conjunctivae are normal. PERRL. EOMI. Head: Atraumatic. Mouth/Throat: Mucous membranes are moist.   Cardiovascular: Normal rate, regular rhythm. Grossly normal heart sounds.  Good peripheral circulation. Respiratory: Normal respiratory effort.  No retractions. Lungs CTAB. Gastrointestinal: Soft and nontender. No distention. No abdominal bruits. No CVA tenderness.  No rebound.  No guarding. Musculoskeletal: Patient is tender to palpation over bilateral hips.  She has tenderness to suprapubic region.   Neurologic:  Normal speech and language. No gross focal neurologic deficits are appreciated. No gait instability. Skin:  Skin is warm, dry and intact. No rash noted. Psychiatric: Mood and affect are normal. Speech and behavior are normal.  ____________________________________________   LABS (all labs  ordered are listed, but only abnormal results are displayed)  Labs Reviewed  CBC WITH DIFFERENTIAL/PLATELET - Abnormal; Notable for the following components:      Result Value   WBC 12.9 (*)    MCV 78.9 (*)    MCH 24.8 (*)    Neutro Abs 10.4 (*)    All other components within normal limits  COMPREHENSIVE METABOLIC PANEL - Abnormal; Notable for the following components:   Potassium 3.3 (*)    CO2 21 (*)    Total Protein 8.3 (*)    All other components within normal limits  URINALYSIS, W/ REFLEX TO CULTURE (INFECTION SUSPECTED)  HCG, QUANTITATIVE, PREGNANCY   ____________________________________________  EKG   ____________________________________________  RADIOLOGY  ED MD interpretation:    Official radiology report(s): No results found.  ____________________________________________   PROCEDURES  Procedure(s) performed: None  Procedures  Critical Care performed: No  ____________________________________________   INITIAL IMPRESSION / ASSESSMENT AND PLAN / ED COURSE     Zoe Hunter is a 27 y.o. female presents to the emergency room for complaint of  bilateral hip and pelvic pain for the past 24 hours.  Patient reports that the pain is constant and sharp/stabbing with intermittent cramping.  Patient is approximately [redacted] weeks pregnant.  She denies vaginal bleeding or leakage of fluid.  Patient does endorse that she has pain and burning with urination.  She continues to have nausea/vomiting.  She denies abdominal pain/diarrhea/body aches. Patient was seen in the emergency room at [facility] on 05/19/2022 for vomiting during pregnancy.  She was given over-the-counter Zofran upon release.  Her labs were relatively reassuring other than slight signs of dehydration.  She was to follow-up with her OB/GYN provider.  She has not established with this plan as of yet. Patient reports that she is staying adequately hydrated. Patient has not taken anything for her pain at this time.    Will obtain CBC, CMP, UA Will give bolus of lactated Ringer's Will give morphine for pain Will obtain OB ultrasound  Patient is actively vomiting.  Ordered Zofran ODT.  And is unable to lay flat for ultrasound.  She is still in significant amount of pain.  I discussed her case with Dr. Charna Archer and gave care handoff.     ____________________________________________   FINAL CLINICAL IMPRESSION(S) / ED DIAGNOSES  Final diagnoses:  None     ED Discharge Orders     None        Note:  This document was prepared using Dragon voice recognition software and may include unintentional dictation errors.     Willaim Rayas, NP 05/27/22 1518    Arta Silence, MD 05/28/22 1537

## 2022-05-27 NOTE — ED Notes (Signed)
2nd bile-colored emesis noted since arrival to room.

## 2022-05-27 NOTE — ED Notes (Signed)
Patient again stated that she did not need to void. Patient had a small amount of bile-colored emesis. Dr Charna Archer aware.

## 2022-05-27 NOTE — ED Notes (Signed)
Room darkened per patient's request. 

## 2022-05-27 NOTE — ED Provider Notes (Signed)
-----------------------------------------   3:18 PM on 05/27/2022 -----------------------------------------  Blood pressure 130/76, pulse 96, temperature 99.1 F (37.3 C), temperature source Oral, resp. rate 20, height 5\' 3"  (1.6 m), weight 108.9 kg, last menstrual period 11/16/2021, SpO2 97 %.  Assuming care from NP [facility].  In short, Zoe Hunter is a 27 y.o. female with a chief complaint of Pelvic Pain and Back Pain .  Refer to the original H&P for additional details.  The current plan of care is to provide additional pain medication and reassess following [place].  ----------------------------------------- 7:36 PM on 05/27/2022 ----------------------------------------- Pelvic ultrasound is unremarkable with intrauterine pregnancy, renal ultrasound shows no evidence of hydronephrosis.  Patient continues to have significant pain and nausea, urinalysis does not appear concerning for UTI although we will send for culture given her dysuria.  We will give additional dose of pain medication and further assess with MRI of her abdomen/pelvis to rule out appendicitis or other etiology.  Patient turned over to oncoming divider pending MRI results.    Blake Divine, MD 05/27/22 [id number]

## 2022-05-27 NOTE — ED Triage Notes (Signed)
Patient presents with pelvic, lower back and side pain that started last night. Reports she is [redacted] weeks pregnant. Denies any vaginal discharge at this moment  Seen at [facility] recently for N/V and sent home with ODT Zofran

## 2022-05-27 NOTE — ED Notes (Signed)
Patient vomited approximately 127ml of bile-colored emesis. Dr. Charna Archer informed. Patient's significant other is at bedside.

## 2022-05-28 DIAGNOSIS — R100 Acute abdomen: Secondary | ICD-10-CM | POA: Diagnosis not present

## 2022-05-28 DIAGNOSIS — Z3A11 11 weeks gestation of pregnancy: Secondary | ICD-10-CM | POA: Diagnosis not present

## 2022-05-28 DIAGNOSIS — O210 Mild hyperemesis gravidarum: Secondary | ICD-10-CM | POA: Diagnosis present

## 2022-05-28 LAB — COMPREHENSIVE METABOLIC PANEL
ALT: 12 U/L (ref 0–44)
AST: 22 U/L (ref 15–41)
Albumin: 2.9 g/dL — ABNORMAL LOW (ref 3.5–5.0)
Alkaline Phosphatase: 57 U/L (ref 38–126)
Anion gap: 7 (ref 5–15)
BUN: <5 mg/dL — ABNORMAL LOW (ref 6–20)
CO2: 24 mmol/L (ref 22–32)
Calcium: 8.4 mg/dL — ABNORMAL LOW (ref 8.9–10.3)
Chloride: 105 mmol/L (ref 98–111)
Creatinine, Ser: 0.49 mg/dL (ref 0.44–1.00)
GFR, Estimated: >60 mL/min (ref >60)
Glucose, Bld: 104 mg/dL — ABNORMAL HIGH (ref 70–99)
Potassium: 3.2 mmol/L — ABNORMAL LOW (ref 3.5–5.1)
Sodium: 136 mmol/L (ref 135–145)
Total Bilirubin: 0.5 mg/dL (ref 0.3–1.2)
Total Protein: 6.3 g/dL — ABNORMAL LOW (ref 6.5–8.1)

## 2022-05-28 LAB — URINE DRUG SCREEN, QUALITATIVE (ARMC ONLY)
Amphetamines, Ur Screen: NOT DETECTED
Barbiturates, Ur Screen: NOT DETECTED
Benzodiazepine, Ur Scrn: NOT DETECTED
Cannabinoid 50 Ng, Ur ~~LOC~~: POSITIVE — AB
Cocaine Metabolite,Ur ~~LOC~~: NOT DETECTED
MDMA (Ecstasy)Ur Screen: NOT DETECTED
Methadone Scn, Ur: NOT DETECTED
Opiate, Ur Screen: POSITIVE — AB
Phencyclidine (PCP) Ur S: NOT DETECTED
Tricyclic, Ur Screen: NOT DETECTED

## 2022-05-28 MED ORDER — IBUPROFEN 800 MG PO TABS
800.0000 mg | ORAL_TABLET | Freq: Three times a day (TID) | ORAL | Status: DC | PRN
  Administered 2022-05-28: 800 mg via ORAL
  Filled 2022-05-28: qty 1

## 2022-05-28 MED ORDER — THIAMINE HCL 100 MG/ML IJ SOLN
Freq: Once | INTRAVENOUS | Status: AC
  Filled 2022-05-28: qty 1000

## 2022-05-28 MED ORDER — ONDANSETRON 4 MG PO TBDP
4.0000 mg | ORAL_TABLET | Freq: Three times a day (TID) | ORAL | Status: DC | PRN
  Filled 2022-05-28: qty 1

## 2022-05-28 MED ORDER — POTASSIUM CHLORIDE 2 MEQ/ML IV SOLN
INTRAVENOUS | Status: DC
  Filled 2022-05-28: qty 1000

## 2022-05-28 MED ORDER — KCL IN DEXTROSE-NACL 20-5-0.9 MEQ/L-%-% IV SOLN
INTRAVENOUS | Status: DC
  Filled 2022-05-28 (×5): qty 1000

## 2022-05-28 MED ORDER — SODIUM CHLORIDE 0.9 % IV SOLN: 8.0000 mg | Freq: Three times a day (TID) | INTRAVENOUS | Status: DC | PRN

## 2022-05-28 MED ORDER — KCL IN DEXTROSE-NACL 20-5-0.9 MEQ/L-%-% IV SOLN
INTRAVENOUS | Status: DC
  Filled 2022-05-28 (×2): qty 1000

## 2022-05-28 MED ORDER — ASPIRIN 81 MG PO TBEC: 81.0000 mg | DELAYED_RELEASE_TABLET | Freq: Every day | ORAL | 12 refills | Status: DC

## 2022-05-28 MED ORDER — HYDROXYZINE HCL 50 MG/ML IM SOLN
50.0000 mg | Freq: Four times a day (QID) | INTRAMUSCULAR | Status: DC | PRN
  Administered 2022-05-28: 50 mg via INTRAMUSCULAR
  Filled 2022-05-28: qty 1

## 2022-05-28 MED ORDER — KCL IN DEXTROSE-NACL 20-5-0.9 MEQ/L-%-% IV SOLN: INTRAVENOUS | Status: DC

## 2022-05-28 MED ORDER — ONDANSETRON 4 MG PO TBDP: 4.0000 mg | ORAL_TABLET | Freq: Three times a day (TID) | ORAL | 0 refills | Status: DC | PRN

## 2022-05-28 MED ORDER — SODIUM CHLORIDE 0.9 % IV SOLN
25.0000 mg | Freq: Three times a day (TID) | INTRAVENOUS | Status: DC | PRN
  Filled 2022-05-28: qty 1

## 2022-05-28 MED ORDER — SODIUM CHLORIDE 0.9 % IV SOLN
8.0000 mg | Freq: Three times a day (TID) | INTRAVENOUS | Status: DC
  Administered 2022-05-28: 8 mg via INTRAVENOUS
  Filled 2022-05-28 (×2): qty 4

## 2022-05-28 MED ORDER — PROMETHAZINE HCL 25 MG RE SUPP: 12.5000 mg | RECTAL | Status: DC | PRN

## 2022-05-28 MED ORDER — ONDANSETRON HCL 4 MG/2ML IJ SOLN: 4.0000 mg | Freq: Three times a day (TID) | INTRAMUSCULAR | Status: DC | PRN

## 2022-05-28 MED ORDER — SODIUM CHLORIDE 0.9 % IV SOLN: INTRAVENOUS | Status: DC | PRN

## 2022-05-28 MED ORDER — SODIUM CHLORIDE 0.9 % IV SOLN
8.0000 mg | Freq: Three times a day (TID) | INTRAVENOUS | Status: DC
  Filled 2022-05-28 (×2): qty 4

## 2022-05-28 MED ORDER — ONDANSETRON 4 MG PO TBDP: 4.0000 mg | ORAL_TABLET | Freq: Three times a day (TID) | ORAL | Status: DC | PRN

## 2022-05-28 MED ORDER — PROMETHAZINE HCL 25 MG PO TABS
12.5000 mg | ORAL_TABLET | ORAL | Status: DC | PRN
  Administered 2022-05-28: 25 mg via ORAL
  Filled 2022-05-28: qty 1

## 2022-05-28 MED ORDER — ONDANSETRON HCL 4 MG/2ML IJ SOLN
4.0000 mg | Freq: Three times a day (TID) | INTRAMUSCULAR | Status: DC | PRN
  Administered 2022-05-28: 4 mg via INTRAVENOUS
  Filled 2022-05-28: qty 2

## 2022-05-28 MED ORDER — ONDANSETRON 4 MG PO TBDP
4.0000 mg | ORAL_TABLET | Freq: Once | ORAL | Status: AC
  Administered 2022-05-28: 4 mg via ORAL

## 2022-05-28 MED ORDER — HYDROXYZINE HCL 25 MG PO TABS: 50.0000 mg | ORAL_TABLET | Freq: Four times a day (QID) | ORAL | Status: DC | PRN

## 2022-05-28 MED ORDER — HYDROMORPHONE HCL 1 MG/ML IJ SOLN
0.5000 mg | INTRAMUSCULAR | Status: DC | PRN
  Administered 2022-05-28: 0.5 mg via INTRAVENOUS
  Filled 2022-05-28: qty 0.5

## 2022-05-28 MED ORDER — ASPIRIN 81 MG PO TBEC
81.0000 mg | DELAYED_RELEASE_TABLET | Freq: Every day | ORAL | Status: DC
  Administered 2022-05-28: 81 mg via ORAL
  Filled 2022-05-28: qty 1

## 2022-05-28 NOTE — Discharge Summary (Signed)
Patient ID: KORISSA SWEETIN MRN: [id number] DOB/AGE: 26-Mar-1995 27 y.o.  Admit date: 05/27/2022 Discharge date: 05/28/2022  Admission Diagnoses: hyperemesis, intractable abdominal and hip pain  Discharge Diagnoses: N/V improved patient was able to advance diet without episodes of N/V  Factors complicating pregnancy: -History of preeclampsia -Obesity in pregnancy, pregravid BMI 43 -History of hyperemesis gravidarum, she states she is having some nausea but not severe. She is currently using B6 50 mg twice a day and Unisom 25 mg at night. She does request to have Zofran on hand if her symptoms were to worsen. -She does have a history of depression, anxiety she has a history of depression, anxiety, bipolar disorder, PTSD.She is not currently on any medication but is working with her therapist weekly which she finds very beneficial.   Prenatal Procedures: ultrasound  Significant Diagnostic Studies:  Results for orders placed or performed during the hospital encounter of 05/27/22 (from the past 168 hour(s))  CBC with Differential   Collection Time: 05/27/22  1:56 PM  Result Value Ref Range   WBC 12.9 (H) 4.0 - 10.5 K/uL   RBC 4.83 3.87 - 5.11 MIL/uL   Hemoglobin 12.0 12.0 - 15.0 g/dL   HCT 38.1 36.0 - 46.0 %   MCV 78.9 (L) 80.0 - 100.0 fL   MCH 24.8 (L) 26.0 - 34.0 pg   MCHC 31.5 30.0 - 36.0 g/dL   RDW 14.7 11.5 - 15.5 %   Platelets 357 150 - 400 K/uL   nRBC 0.0 0.0 - 0.2 %   Neutrophils Relative % 82 %   Neutro Abs 10.4 (H) 1.7 - 7.7 K/uL   Lymphocytes Relative 15 %   Lymphs Abs 2.0 0.7 - 4.0 K/uL   Monocytes Relative 3 %   Monocytes Absolute 0.4 0.1 - 1.0 K/uL   Eosinophils Relative 0 %   Eosinophils Absolute 0.0 0.0 - 0.5 K/uL   Basophils Relative 0 %   Basophils Absolute 0.0 0.0 - 0.1 K/uL   Immature Granulocytes 0 %   Abs Immature Granulocytes 0.04 0.00 - 0.07 K/uL  Comprehensive metabolic panel   Collection Time: 05/27/22  1:56 PM  Result Value Ref Range   Sodium 135 135 -  145 mmol/L   Potassium 3.3 (L) 3.5 - 5.1 mmol/L   Chloride 101 98 - 111 mmol/L   CO2 21 (L) 22 - 32 mmol/L   Glucose, Bld 99 70 - 99 mg/dL   BUN 6 6 - 20 mg/dL   Creatinine, Ser 0.49 0.44 - 1.00 mg/dL   Calcium 9.5 8.9 - 10.3 mg/dL   Total Protein 8.3 (H) 6.5 - 8.1 g/dL   Albumin 3.9 3.5 - 5.0 g/dL   AST 18 15 - 41 U/L   ALT 14 0 - 44 U/L   Alkaline Phosphatase 76 38 - 126 U/L   Total Bilirubin 0.4 0.3 - 1.2 mg/dL   GFR, Estimated >60 >60 mL/min   Anion gap 13 5 - 15  hCG, quantitative, pregnancy   Collection Time: 05/27/22  1:56 PM  Result Value Ref Range   hCG, Beta Chain, Quant, S 80,733 (H) <5 mIU/mL  Lipase, blood   Collection Time: 05/27/22  1:56 PM  Result Value Ref Range   Lipase 30 11 - 51 U/L  Urinalysis, w/ Reflex to Culture (Infection Suspected) -Urine, Clean Catch   Collection Time: 05/27/22  6:45 PM  Result Value Ref Range   Specimen Source URINE, CLEAN CATCH    Color, Urine YELLOW (A) YELLOW  APPearance TURBID (A) CLEAR   Specific Gravity, Urine 1.016 1.005 - 1.030   pH 7.0 5.0 - 8.0   Glucose, UA NEGATIVE NEGATIVE mg/dL   Hgb urine dipstick NEGATIVE NEGATIVE   Bilirubin Urine NEGATIVE NEGATIVE   Ketones, ur 20 (A) NEGATIVE mg/dL   Protein, ur NEGATIVE NEGATIVE mg/dL   Nitrite NEGATIVE NEGATIVE   Leukocytes,Ua TRACE (A) NEGATIVE   RBC / HPF 0-5 0 - 5 RBC/hpf   WBC, UA NONE SEEN 0 - 5 WBC/hpf   Bacteria, UA RARE (A) NONE SEEN   Squamous Epithelial / HPF NONE SEEN 0 - 5 /HPF   Mucus PRESENT   Comprehensive metabolic panel   Collection Time: 05/28/22  9:35 AM  Result Value Ref Range   Sodium 136 135 - 145 mmol/L   Potassium 3.2 (L) 3.5 - 5.1 mmol/L   Chloride 105 98 - 111 mmol/L   CO2 24 22 - 32 mmol/L   Glucose, Bld 104 (H) 70 - 99 mg/dL   BUN <5 (L) 6 - 20 mg/dL   Creatinine, Ser 0.49 0.44 - 1.00 mg/dL   Calcium 8.4 (L) 8.9 - 10.3 mg/dL   Total Protein 6.3 (L) 6.5 - 8.1 g/dL   Albumin 2.9 (L) 3.5 - 5.0 g/dL   AST 22 15 - 41 U/L   ALT 12 0 - 44  U/L   Alkaline Phosphatase 57 38 - 126 U/L   Total Bilirubin 0.5 0.3 - 1.2 mg/dL   GFR, Estimated >60 >60 mL/min   Anion gap 7 5 - 15  Urine Drug Screen, Qualitative ([facility] only)   Collection Time: 05/28/22 12:31 PM  Result Value Ref Range   Tricyclic, Ur Screen NONE DETECTED NONE DETECTED   Amphetamines, Ur Screen NONE DETECTED NONE DETECTED   MDMA (Ecstasy)Ur Screen NONE DETECTED NONE DETECTED   Cocaine Metabolite,Ur [place] NONE DETECTED NONE DETECTED   Opiate, Ur Screen POSITIVE (A) NONE DETECTED   Phencyclidine (PCP) Ur S NONE DETECTED NONE DETECTED   Cannabinoid 50 Ng, Ur [place] POSITIVE (A) NONE DETECTED   Barbiturates, Ur Screen NONE DETECTED NONE DETECTED   Benzodiazepine, Ur Scrn NONE DETECTED NONE DETECTED   Methadone Scn, Ur NONE DETECTED NONE DETECTED    Treatments: IV hydration, analgesia: Dilaudid, and IV antiemetics  Hospital Course:  RAQUITA SANT  is a 27 y.o. [id number] [redacted]w[redacted]d admitted for intractable abdominal and hip pain x24hrs, nausea and vomiting in early pregnancy.  She presented to the ER with complaints of ongoing severe pain, relieved with iv pain meds only but returning. She has been in the ER for most of the day without an obvious etiology found.   She has no associated GI symptoms such as diarrhea or constipation. No vaginal bleeding or discharge. No dysuria, polyuria, hematuria. She has had some nausea vomiting which she stated started after the pain. At this time patient has had labs with a slight elevation in her white blood cell count, asymptomatic bacteriuria on urinalysis, OB and renal ultrasounds were reassuring. MRI orders without acute findings, but no appendicitis. She has already had a cholecystectomy.   She has tried unisom and zofran as an outpatient to help with N/V but has not been effective.  She denies cramping, vaginal bleeding, or LOF.  Has not yet been able to feel fetal movement yet.    Pregnancy care at [facility] in [place].    Discharge Physical Exam:   BP 100/60 (BP Location: Left Arm)   Pulse 70   Temp 98.3  F (36.8 C) (Oral)   Resp 18   Ht 5\' 3"  (1.6 m)   Wt 110.4 kg   LMP 11/16/2021 (Exact Date)   SpO2 95%   BMI 43.12 kg/m   General: NAD CV: RRR Pulm: CTABL, nl effort ABD: s/nd/nt, gravid DVT Evaluation: LE non-ttp, no evidence of DVT on exam.   Fetal evaluation comments: 160's FHT'S        Discharge Condition: Stable  Disposition: Discharge disposition: 01-Home or Self Care     home to self care to f/u with [facility] -Ask OB provider about pelvic floor therapy referral -inquire about an orthopedic referral   Allergies as of 05/28/2022   No Known Allergies      Medication List     STOP taking these medications    ergocalciferol 1.25 MG (50000 UT) capsule Commonly known as: VITAMIN D2   erythromycin ophthalmic ointment   IRON PO   ondansetron 4 MG tablet Commonly known as: ZOFRAN       TAKE these medications    amphetamine-dextroamphetamine 30 MG tablet Commonly known as: ADDERALL Take 30 mg by mouth daily. cBC   aspirin EC 81 MG tablet Take 1 tablet (81 mg total) by mouth daily. Swallow whole. Start taking on: May 29, 2022   ondansetron 4 MG disintegrating tablet Commonly known as: ZOFRAN-ODT Take 1 tablet (4 mg total) by mouth every 8 (eight) hours as needed for nausea or vomiting.        [place] Follow up in 3 day(s).   Why: for ER follow up Contact information: [address] [place] [address] [phone]                 Signed:  Avelino Leeds, CNM 05/28/2022 4:09 PM

## 2022-05-28 NOTE — Progress Notes (Signed)
Pt discharged home.  Discharge instructions, prescriptions and follow up appointment given to and reviewed with pt.  Pt verbalized understanding.  Escorted by auxillary. 

## 2022-05-28 NOTE — Progress Notes (Signed)
ANTEPARTUM PROGRESS NOTE  Zoe Hunter is a 27 y.o. G2P0101 at [redacted]w[redacted]d who is admitted for intractable abdominal and hip pain x 24 hours and N/V in early pregnancy.   Estimated Date of Delivery: 12/12/22  Length of Stay:  0 Days. Admitted 05/27/2022  Subjective: reports she is feeling better. Still having hip pain, wants to try eat something.   She reports:  -no leakage of fluid -no vaginal bleeding -no contractions  Vitals:  BP (!) 100/55 (BP Location: Left Arm) Comment: nurse Kendra notified  Pulse 63   Temp 98.8 F (37.1 C) (Oral)   Resp 17   Ht 5\' 3"  (1.6 m)   Wt 108.9 kg   LMP 11/16/2021 (Exact Date)   SpO2 95%   BMI 42.51 kg/m   Physical Exam Constitutional:      Appearance: She is obese. She is ill-appearing.  HENT:     Head: Normocephalic and atraumatic.  Cardiovascular:     Rate and Rhythm: Normal rate.     Pulses: Normal pulses.  Pulmonary:     Effort: Pulmonary effort is normal.  Musculoskeletal:        General: Normal range of motion.     Cervical back: Normal range of motion and neck supple.  Neurological:     Mental Status: She is alert and oriented to person, place, and time.  Skin:    General: Skin is warm and dry.  Psychiatric:        Mood and Affect: Mood normal.        Behavior: Behavior normal.        Thought Content: Thought content normal.        Judgment: Judgment normal.      Fetal monitoring: FHR: 166 bpm, via doppler  Results for orders placed or performed during the hospital encounter of 05/27/22 (from the past 48 hour(s))  CBC with Differential     Status: Abnormal   Collection Time: 05/27/22  1:56 PM  Result Value Ref Range   WBC 12.9 (H) 4.0 - 10.5 K/uL   RBC 4.83 3.87 - 5.11 MIL/uL   Hemoglobin 12.0 12.0 - 15.0 g/dL   HCT 38.1 36.0 - 46.0 %   MCV 78.9 (L) 80.0 - 100.0 fL   MCH 24.8 (L) 26.0 - 34.0 pg   MCHC 31.5 30.0 - 36.0 g/dL   RDW 14.7 11.5 - 15.5 %   Platelets 357 150 - 400 K/uL   nRBC 0.0 0.0 - 0.2 %   Neutrophils  Relative % 82 %   Neutro Abs 10.4 (H) 1.7 - 7.7 K/uL   Lymphocytes Relative 15 %   Lymphs Abs 2.0 0.7 - 4.0 K/uL   Monocytes Relative 3 %   Monocytes Absolute 0.4 0.1 - 1.0 K/uL   Eosinophils Relative 0 %   Eosinophils Absolute 0.0 0.0 - 0.5 K/uL   Basophils Relative 0 %   Basophils Absolute 0.0 0.0 - 0.1 K/uL   Immature Granulocytes 0 %   Abs Immature Granulocytes 0.04 0.00 - 0.07 K/uL    Comment: Performed at [facility], [address] [place], [place] [address]  Comprehensive metabolic panel     Status: Abnormal   Collection Time: 05/27/22  1:56 PM  Result Value Ref Range   Sodium 135 135 - 145 mmol/L   Potassium 3.3 (L) 3.5 - 5.1 mmol/L   Chloride 101 98 - 111 mmol/L   CO2 21 (L) 22 - 32 mmol/L   Glucose, Bld 99 70 -  99 mg/dL    Comment: Glucose reference range applies only to samples taken after fasting for at least 8 hours.   BUN 6 6 - 20 mg/dL   Creatinine, Ser 0.49 0.44 - 1.00 mg/dL   Calcium 9.5 8.9 - 10.3 mg/dL   Total Protein 8.3 (H) 6.5 - 8.1 g/dL   Albumin 3.9 3.5 - 5.0 g/dL   AST 18 15 - 41 U/L   ALT 14 0 - 44 U/L   Alkaline Phosphatase 76 38 - 126 U/L   Total Bilirubin 0.4 0.3 - 1.2 mg/dL   GFR, Estimated >60 >60 mL/min    Comment: (NOTE) Calculated using the CKD-EPI Creatinine Equation (2021)    Anion gap 13 5 - 15    Comment: Performed at [facility], [address] [place], [place] [address]  hCG, quantitative, pregnancy     Status: Abnormal   Collection Time: 05/27/22  1:56 PM  Result Value Ref Range   hCG, Beta Chain, Quant, S 80,733 (H) <5 mIU/mL    Comment:          GEST. AGE      CONC.  (mIU/mL)   <=1 WEEK        5 - 50     2 WEEKS       50 - 500     3 WEEKS       100 - 10,000     4 WEEKS     1,000 - 30,000     5 WEEKS     3,500 - 115,000   6-8 WEEKS     12,000 - 270,000    12 WEEKS     15,000 - 220,000        FEMALE AND NON-PREGNANT FEMALE:     LESS THAN 5 mIU/mL Performed at [facility], [address] [place], [place] [address]   Lipase, blood     Status: None   Collection Time: 05/27/22  1:56 PM  Result Value Ref Range   Lipase 30 11 - 51 U/L    Comment: Performed at [facility], [address] [place], [place] [address]  Urinalysis, w/ Reflex to Culture (Infection Suspected) -Urine, Clean Catch     Status: Abnormal   Collection Time: 05/27/22  6:45 PM  Result Value Ref Range   Specimen Source URINE, CLEAN CATCH    Color, Urine YELLOW (A) YELLOW   APPearance TURBID (A) CLEAR   Specific Gravity, Urine 1.016 1.005 - 1.030   pH 7.0 5.0 - 8.0   Glucose, UA NEGATIVE NEGATIVE mg/dL   Hgb urine dipstick NEGATIVE NEGATIVE   Bilirubin Urine NEGATIVE NEGATIVE   Ketones, ur 20 (A) NEGATIVE mg/dL   Protein, ur NEGATIVE NEGATIVE mg/dL   Nitrite NEGATIVE NEGATIVE   Leukocytes,Ua TRACE (A) NEGATIVE   RBC / HPF 0-5 0 - 5 RBC/hpf   WBC, UA NONE SEEN 0 - 5 WBC/hpf    Comment:        Reflex urine culture not performed if WBC <=10, OR if Squamous epithelial cells >5. If Squamous epithelial cells >5 suggest recollection.    Bacteria, UA RARE (A) NONE SEEN   Squamous Epithelial / HPF NONE SEEN 0 - 5 /HPF   Mucus PRESENT     Comment: Performed at [facility], [address] [place], [place] [address]    MR ABDOMEN WO CONTRAST  Result Date: 05/27/2022 CLINICAL DATA:  Pregnant appendicitis suspected, pelvic, lower back and side  pain since last night, [redacted] weeks pregnant EXAM: MRI ABDOMEN AND PELVIS WITHOUT CONTRAST TECHNIQUE: Multiplanar multisequence MR imaging of the abdomen and pelvis was performed. No intravenous contrast was administered. COMPARISON:  None Available. FINDINGS: COMBINED FINDINGS FOR BOTH MR ABDOMEN AND PELVIS Lower chest: No acute abnormality. Hepatobiliary: No focal liver abnormality is seen. Gallbladder is extremely contracted or surgically absent. No biliary dilatation. Pancreas: Unremarkable. No pancreatic ductal dilatation or surrounding inflammatory  changes. Spleen: Normal in size without significant abnormality. Adrenals/Urinary Tract: Adrenal glands are unremarkable. Kidneys are normal, without obvious renal calculi, solid lesion, or hydronephrosis. Bladder is unremarkable. Stomach/Bowel: Stomach is within normal limits. Appendix appears normal. No evidence of bowel wall thickening, distention, or inflammatory changes. Vascular/Lymphatic: No significant vascular findings are present. No enlarged abdominal or pelvic lymph nodes. Reproductive: Gravid uterus. Anterior placenta. No obvious abnormality of the fetus on this examination which is not tailored for the evaluation of fetal anatomy. Other: No abdominal wall hernia or abnormality. No ascites. Musculoskeletal: No acute or significant osseous findings. IMPRESSION: 1. No noncontrast MR findings explain abdominal pain. Normal appendix. 2. Gravid uterus. Anterior placenta. No obvious abnormality of the fetus on this examination which is not tailored for the evaluation of fetal anatomy. 3. Gallbladder is extremely contracted or surgically absent. No biliary dilatation. Electronically Signed   By: Delanna Ahmadi M.D.   On: 05/27/2022 22:43   MR PELVIS WO CONTRAST  Result Date: 05/27/2022 CLINICAL DATA:  Pregnant appendicitis suspected, pelvic, lower back and side pain since last night, [redacted] weeks pregnant EXAM: MRI ABDOMEN AND PELVIS WITHOUT CONTRAST TECHNIQUE: Multiplanar multisequence MR imaging of the abdomen and pelvis was performed. No intravenous contrast was administered. COMPARISON:  None Available. FINDINGS: COMBINED FINDINGS FOR BOTH MR ABDOMEN AND PELVIS Lower chest: No acute abnormality. Hepatobiliary: No focal liver abnormality is seen. Gallbladder is extremely contracted or surgically absent. No biliary dilatation. Pancreas: Unremarkable. No pancreatic ductal dilatation or surrounding inflammatory changes. Spleen: Normal in size without significant abnormality. Adrenals/Urinary Tract: Adrenal  glands are unremarkable. Kidneys are normal, without obvious renal calculi, solid lesion, or hydronephrosis. Bladder is unremarkable. Stomach/Bowel: Stomach is within normal limits. Appendix appears normal. No evidence of bowel wall thickening, distention, or inflammatory changes. Vascular/Lymphatic: No significant vascular findings are present. No enlarged abdominal or pelvic lymph nodes. Reproductive: Gravid uterus. Anterior placenta. No obvious abnormality of the fetus on this examination which is not tailored for the evaluation of fetal anatomy. Other: No abdominal wall hernia or abnormality. No ascites. Musculoskeletal: No acute or significant osseous findings. IMPRESSION: 1. No noncontrast MR findings explain abdominal pain. Normal appendix. 2. Gravid uterus. Anterior placenta. No obvious abnormality of the fetus on this examination which is not tailored for the evaluation of fetal anatomy. 3. Gallbladder is extremely contracted or surgically absent. No biliary dilatation. Electronically Signed   By: Delanna Ahmadi M.D.   On: 05/27/2022 22:43   US Renal  Result Date: 05/27/2022 CLINICAL DATA:  One day history of flank pain in pregnancy EXAM: RENAL / URINARY TRACT ULTRASOUND COMPLETE COMPARISON:  None Available. FINDINGS: Right Kidney: Length = 13.4 cm AP renal pelvis diameter = <10 mm Normal parenchymal echogenicity with preserved corticomedullary differentiation. No urinary tract dilation or shadowing calculi. The ureter is not seen. Left Kidney: Length = 10.8 cm AP renal pelvis diameter = <10 mm Normal parenchymal echogenicity with preserved corticomedullary differentiation. No urinary tract dilation or shadowing calculi. The ureter is not seen. Bladder: Appears normal for degree of bladder distention. Other: Incidentally  noted gravid uterus. IMPRESSION: 1. No urinary tract dilation or shadowing calculi. 2. Renal size discrepancy may be related to technique. Electronically Signed   By: Darrin Nipper M.D.    On: 05/27/2022 17:38   US OB Comp Less 14 Wks  Result Date: 05/27/2022 CLINICAL DATA:  Pelvic pain EXAM: OBSTETRIC <14 WK ULTRASOUND TECHNIQUE: Transabdominal ultrasound was performed for evaluation of the gestation as well as the maternal uterus and adnexal regions. COMPARISON:  None Available. FINDINGS: Intrauterine gestational sac: Single Yolk sac:  Not visualized Embryo:  Visualized Cardiac Activity: Visualized Heart Rate: 158 bpm MSD:    mm    w     d CRL:   55.5 mm   12 w 1 d                  [place] EDC: 12/08/2022 Subchorionic hemorrhage:  None visualized. Maternal uterus/adnexae: No adnexal mass or free fluid. IMPRESSION: Twelve week 1 day intrauterine pregnancy. Fetal heart rate 158 beats per minute. No acute maternal findings. Electronically Signed   By: Rolm Baptise M.D.   On: 05/27/2022 17:33    Current scheduled medications  aspirin EC  81 mg Oral Daily    I have reviewed the patient's current medications.  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 - continue IV fluids -continue antiemetics -UDS ordered -continue PRN pain meds -advance diet as tolerated -replace electrolytes as needed  [place], CNM [id number] AM  Avelino Leeds Certified  Nurse Midwife [facility]

## 2022-05-28 NOTE — ED Notes (Signed)
Pt report attempted x 1, the assigned nurse is currently unavailable for report. The charge nurse has been made aware of the delay in patient transfer.

## 2022-05-29 ENCOUNTER — Telehealth: Payer: Self-pay

## 2022-05-29 DIAGNOSIS — F331 Major depressive disorder, recurrent, moderate: Secondary | ICD-10-CM | POA: Diagnosis not present

## 2022-05-29 DIAGNOSIS — F902 Attention-deficit hyperactivity disorder, combined type: Secondary | ICD-10-CM | POA: Diagnosis not present

## 2022-05-29 DIAGNOSIS — F411 Generalized anxiety disorder: Secondary | ICD-10-CM | POA: Diagnosis not present

## 2022-05-29 DIAGNOSIS — Z419 Encounter for procedure for purposes other than remedying health state, unspecified: Secondary | ICD-10-CM | POA: Diagnosis not present

## 2022-05-29 LAB — URINE CULTURE

## 2022-05-29 NOTE — Telephone Encounter (Signed)
Not needed- visit was preg related

## 2022-06-01 DIAGNOSIS — F902 Attention-deficit hyperactivity disorder, combined type: Secondary | ICD-10-CM | POA: Diagnosis not present

## 2022-06-01 DIAGNOSIS — F331 Major depressive disorder, recurrent, moderate: Secondary | ICD-10-CM | POA: Diagnosis not present

## 2022-06-01 DIAGNOSIS — F411 Generalized anxiety disorder: Secondary | ICD-10-CM | POA: Diagnosis not present

## 2022-06-05 ENCOUNTER — Other Ambulatory Visit: Payer: Self-pay

## 2022-06-05 ENCOUNTER — Encounter (HOSPITAL_COMMUNITY): Payer: Self-pay

## 2022-06-05 ENCOUNTER — Emergency Department (HOSPITAL_COMMUNITY)
Admission: EM | Admit: 2022-06-05 | Discharge: 2022-06-05 | Disposition: A | Discharge status: Home / Self Care | Payer: Medicaid Other | Attending: Emergency Medicine | Admitting: Emergency Medicine

## 2022-06-05 DIAGNOSIS — R112 Nausea with vomiting, unspecified: Secondary | ICD-10-CM

## 2022-06-05 DIAGNOSIS — O219 Vomiting of pregnancy, unspecified: Secondary | ICD-10-CM | POA: Diagnosis not present

## 2022-06-05 DIAGNOSIS — Z3A13 13 weeks gestation of pregnancy: Secondary | ICD-10-CM | POA: Insufficient documentation

## 2022-06-05 LAB — CBC
HCT: 35.6 % — ABNORMAL LOW (ref 36.0–46.0)
Hemoglobin: 11.5 g/dL — ABNORMAL LOW (ref 12.0–15.0)
MCH: 25.7 pg — ABNORMAL LOW (ref 26.0–34.0)
MCHC: 32.3 g/dL (ref 30.0–36.0)
MCV: 79.5 fL — ABNORMAL LOW (ref 80.0–100.0)
Platelets: 374 10*3/uL (ref 150–400)
RBC: 4.48 MIL/uL (ref 3.87–5.11)
RDW: 14.8 % (ref 11.5–15.5)
WBC: 13.5 10*3/uL — ABNORMAL HIGH (ref 4.0–10.5)
nRBC: 0 % (ref 0.0–0.2)

## 2022-06-05 LAB — COMPREHENSIVE METABOLIC PANEL
ALT: 13 U/L (ref 0–44)
AST: 15 U/L (ref 15–41)
Albumin: 3.6 g/dL (ref 3.5–5.0)
Alkaline Phosphatase: 63 U/L (ref 38–126)
Anion gap: 10 (ref 5–15)
BUN: 7 mg/dL (ref 6–20)
CO2: 24 mmol/L (ref 22–32)
Calcium: 9.1 mg/dL (ref 8.9–10.3)
Chloride: 99 mmol/L (ref 98–111)
Creatinine, Ser: 0.44 mg/dL (ref 0.44–1.00)
GFR, Estimated: >60 mL/min (ref >60)
Glucose, Bld: 88 mg/dL (ref 70–99)
Potassium: 3.4 mmol/L — ABNORMAL LOW (ref 3.5–5.1)
Sodium: 133 mmol/L — ABNORMAL LOW (ref 135–145)
Total Bilirubin: 0.6 mg/dL (ref 0.3–1.2)
Total Protein: 7.2 g/dL (ref 6.5–8.1)

## 2022-06-05 LAB — HCG, QUANTITATIVE, PREGNANCY: hCG, Beta Chain, Quant, S: 71797 m[IU]/mL — ABNORMAL HIGH (ref <5)

## 2022-06-05 LAB — LIPASE, BLOOD: Lipase: 26 U/L (ref 11–51)

## 2022-06-05 MED ORDER — DICYCLOMINE HCL 10 MG/ML IM SOLN
10.0000 mg | Freq: Once | INTRAMUSCULAR | Status: AC
  Administered 2022-06-05: 10 mg via INTRAMUSCULAR
  Filled 2022-06-05: qty 2

## 2022-06-05 MED ORDER — ACETAMINOPHEN 325 MG PO TABS
650.0000 mg | ORAL_TABLET | Freq: Once | ORAL | Status: AC
  Administered 2022-06-05: 650 mg via ORAL
  Filled 2022-06-05: qty 2

## 2022-06-05 MED ORDER — ONDANSETRON HCL 4 MG/2ML IJ SOLN: 4.0000 mg | Freq: Once | INTRAMUSCULAR | Status: DC | PRN

## 2022-06-05 MED ORDER — PROMETHAZINE HCL 12.5 MG PO TABS
12.5000 mg | ORAL_TABLET | Freq: Once | ORAL | Status: AC
  Administered 2022-06-05: 12.5 mg via ORAL
  Filled 2022-06-05: qty 1

## 2022-06-05 MED ORDER — SODIUM CHLORIDE 0.9 % IV BOLUS
1000.0000 mL | Freq: Once | INTRAVENOUS | Status: AC
  Administered 2022-06-05: 1000 mL via INTRAVENOUS

## 2022-06-05 NOTE — ED Provider Notes (Cosign Needed Addendum)
[place] EMERGENCY DEPARTMENT AT [facility] Provider Note   CSN: [id number] Arrival date & time: 06/05/22  1053     History  Chief Complaint  Patient presents with   Emesis    pregnant   HPI Zoe Hunter is a 27 y.o. female who is [redacted] weeks pregnant presenting for nausea and vomiting.  States it started about 30 hours ago.  She has been persistently vomiting until this morning was the last time she vomited.  States her previous pregnancy she had the same kind of intense nausea and vomiting in the first trimester.  States that since her vomiting started she developed some left upper quadrant pain.  It is nonradiating and sharp.  Sometimes it feels like cramping.  Denies fever at home.  States she had a normal bowel movement 2 days ago.  Was recently admitted to the hospital for hyperemesis gravidarum.  She states that the Phenergan worked the best".  Denies any abnormal vaginal discharge or bleeding.   Emesis      Home Medications Prior to Admission medications   Medication Sig Start Date End Date Taking? Authorizing Provider  amphetamine-dextroamphetamine (ADDERALL) 30 MG tablet Take 30 mg by mouth daily. cBC Patient not taking: Reported on 05/22/2022    [provider]  aspirin EC 81 MG tablet Take 1 tablet (81 mg total) by mouth daily. Swallow whole. 05/29/22   Sonny Dandy, CNM  ondansetron (ZOFRAN-ODT) 4 MG disintegrating tablet Take 1 tablet (4 mg total) by mouth every 8 (eight) hours as needed for nausea or vomiting. 05/28/22   Sonny Dandy, CNM      Allergies    Patient has no known allergies.    Review of Systems   Review of Systems  Gastrointestinal:  Positive for vomiting.    Physical Exam   Vitals:   06/05/22 1141  BP: 128/70  Pulse: 93  Resp: 20  Temp: 98.8 F (37.1 C)  SpO2: 98%    CONSTITUTIONAL:  well-appearing, anxious, NAD NEURO:  Alert and oriented x 3, CN 3-12 grossly intact EYES:  eyes equal  and reactive ENT/NECK:  Supple, no stridor  CARDIO:  regular rate and rhythm, appears well-perfused  PULM:  No respiratory distress, CTAB GI/GU:  non-distended, soft, LUQ tendernees MSK/SPINE:  No gross deformities, no edema, moves all extremities  SKIN:  no rash, atraumatic  *Additional and/or pertinent findings included in MDM below   ED Results / Procedures / Treatments   Labs (all labs ordered are listed, but only abnormal results are displayed) Labs Reviewed  COMPREHENSIVE METABOLIC PANEL - Abnormal; Notable for the following components:      Result Value   Sodium 133 (*)    Potassium 3.4 (*)    All other components within normal limits  CBC - Abnormal; Notable for the following components:   WBC 13.5 (*)    Hemoglobin 11.5 (*)    HCT 35.6 (*)    MCV 79.5 (*)    MCH 25.7 (*)    All other components within normal limits  HCG, QUANTITATIVE, PREGNANCY - Abnormal; Notable for the following components:   hCG, Beta Chain, Quant, S 71,797 (*)    All other components within normal limits  LIPASE, BLOOD  URINALYSIS, ROUTINE W REFLEX MICROSCOPIC    EKG None  Radiology No results found.  Procedures Procedures    Medications Ordered in ED Medications  ondansetron (ZOFRAN) injection 4 mg (has no administration in time range)  dicyclomine (BENTYL) injection 10 mg (has no administration in time range)  promethazine (PHENERGAN) tablet 12.5 mg (12.5 mg Oral Given 06/05/22 1335)  acetaminophen (TYLENOL) tablet 650 mg (650 mg Oral Given 06/05/22 1335)  sodium chloride 0.9 % bolus 1,000 mL (0 mLs Intravenous Stopped 06/05/22 1441)    ED Course/ Medical Decision Making/ A&P                             Medical Decision Making Amount and/or Complexity of Data Reviewed Labs: ordered.  Risk OTC drugs. Prescription drug management.   27 year old well-appearing female presenting for emesis and nausea.  Exam was unremarkable.  Patient has been observed for several hours and no  witnessed emesis during counter.  Treated with Phenergan.  Treated her upper abdominal pain with Tylenol.  Suspect abdominal pain related to what she describes as "violent vomiting".  As it started shortly after she started vomiting.  Have low suspicion for intra-abdominal infection given how well she appears, and afebrile with normal vitals.  Advised her to continue to take Zofran at home as needed for nausea vomiting.  Also advised to hydrate as much as possible.  Advised to follow-up with her OB/GYN if her nausea vomiting persist.  Fluid challenge without any complication.  Return precautions.  Vital stable at discharge.  At discharge patient requested Bentyl for her abdominal discomfort.  States she has taken it in the past with her first child in the first trimester and at work.  Gave her 1 dose of IM Bentyl.  Advised her to follow-up with her OB/GYN to discuss continuation on this medication.        Final Clinical Impression(s) / ED Diagnoses Final diagnoses:  Nausea and vomiting, unspecified vomiting type    Rx / DC Orders ED Discharge Orders     None         Gareth Eagle, PA-C 06/05/22 1451    Gareth Eagle, PA-C 06/05/22 1523    [name], MD 06/06/22 1650

## 2022-06-05 NOTE — ED Notes (Signed)
Pt requesting food.  Pt given saltines.

## 2022-06-05 NOTE — Discharge Instructions (Addendum)
Patient today was overall reassuring.  Given that he continue taking Zofran at home as needed.  Also recommending follow-up with your OB/GYN if your vomiting and nausea persist.  Overall he looks very clinically well.  Your beta hCG level is within normal limits.  If you have worsening nausea, vomiting, worsening abdominal pain or any other concerning symptom please return emerged part for evaluation.

## 2022-06-05 NOTE — ED Triage Notes (Signed)
Pt presents with persistent N/V and states she is [redacted] weeks pregnant. Pt states she is being treated for hyperemesis gravidarum, but has not kept anything down for over 30 hours. Pt also having severe upper abd cramping.

## 2022-06-05 NOTE — ED Notes (Signed)
PA is at the bedside, pt is insisting he write a Rx for bentyl.  PA has explained that bentyl is not ok for pregnancy but pt is insisting he prescribe it anyway.  PA ha advised pt to address medication with her obgyn and pt states that they will not send her in anything until she is seen but she had to cancel her appointment with them today because she was too sick to go and had to come her instead.

## 2022-06-05 NOTE — ED Notes (Signed)
Pt has tolerated a whole glass of water and saltine crackers.

## 2022-06-06 ENCOUNTER — Telehealth: Payer: Self-pay

## 2022-06-06 DIAGNOSIS — Z3689 Encounter for other specified antenatal screening: Secondary | ICD-10-CM | POA: Diagnosis not present

## 2022-06-06 DIAGNOSIS — M25559 Pain in unspecified hip: Secondary | ICD-10-CM | POA: Diagnosis not present

## 2022-06-06 DIAGNOSIS — O26891 Other specified pregnancy related conditions, first trimester: Secondary | ICD-10-CM | POA: Diagnosis not present

## 2022-06-06 DIAGNOSIS — Z3A13 13 weeks gestation of pregnancy: Secondary | ICD-10-CM | POA: Diagnosis not present

## 2022-06-06 NOTE — Transitions of Care (Post Inpatient/ED Visit) (Signed)
   06/06/2022  Name: Zoe Hunter MRN: [id number] DOB: [birth year]  Today's TOC FU Call Status: Today's TOC FU Call Status:: Unsuccessul Call (1st Attempt) Unsuccessful Call (1st Attempt) Date: 06/06/22  Attempted to reach the patient regarding the most recent Inpatient/ED visit.  Follow Up Plan: Additional outreach attempts will be made to reach the patient to complete the Transitions of Care (Post Inpatient/ED visit) call.   Agnes Lawrence, CMA (AAMA)  [facility] [phone]

## 2022-06-08 DIAGNOSIS — F411 Generalized anxiety disorder: Secondary | ICD-10-CM | POA: Diagnosis not present

## 2022-06-08 DIAGNOSIS — F331 Major depressive disorder, recurrent, moderate: Secondary | ICD-10-CM | POA: Diagnosis not present

## 2022-06-09 ENCOUNTER — Other Ambulatory Visit: Payer: Self-pay

## 2022-06-09 ENCOUNTER — Encounter (HOSPITAL_COMMUNITY): Payer: Self-pay | Admitting: Emergency Medicine

## 2022-06-09 ENCOUNTER — Emergency Department (HOSPITAL_COMMUNITY)
Admission: EM | Admit: 2022-06-09 | Discharge: 2022-06-10 | Disposition: A | Discharge status: Home / Self Care | Payer: Medicaid Other | Attending: Emergency Medicine | Admitting: Emergency Medicine

## 2022-06-09 DIAGNOSIS — Z3A14 14 weeks gestation of pregnancy: Secondary | ICD-10-CM | POA: Diagnosis not present

## 2022-06-09 DIAGNOSIS — Z7982 Long term (current) use of aspirin: Secondary | ICD-10-CM | POA: Insufficient documentation

## 2022-06-09 DIAGNOSIS — O210 Mild hyperemesis gravidarum: Secondary | ICD-10-CM | POA: Insufficient documentation

## 2022-06-09 DIAGNOSIS — O26891 Other specified pregnancy related conditions, first trimester: Secondary | ICD-10-CM | POA: Diagnosis not present

## 2022-06-09 LAB — COMPREHENSIVE METABOLIC PANEL
ALT: 13 U/L (ref 0–44)
AST: 13 U/L — ABNORMAL LOW (ref 15–41)
Albumin: 3.4 g/dL — ABNORMAL LOW (ref 3.5–5.0)
Alkaline Phosphatase: 69 U/L (ref 38–126)
Anion gap: 12 (ref 5–15)
BUN: <5 mg/dL — ABNORMAL LOW (ref 6–20)
CO2: 22 mmol/L (ref 22–32)
Calcium: 8.7 mg/dL — ABNORMAL LOW (ref 8.9–10.3)
Chloride: 101 mmol/L (ref 98–111)
Creatinine, Ser: 0.45 mg/dL (ref 0.44–1.00)
GFR, Estimated: >60 mL/min (ref >60)
Glucose, Bld: 98 mg/dL (ref 70–99)
Potassium: 3.5 mmol/L (ref 3.5–5.1)
Sodium: 135 mmol/L (ref 135–145)
Total Bilirubin: 0.3 mg/dL (ref 0.3–1.2)
Total Protein: 7.5 g/dL (ref 6.5–8.1)

## 2022-06-09 LAB — CBC
HCT: 35.7 % — ABNORMAL LOW (ref 36.0–46.0)
Hemoglobin: 11.4 g/dL — ABNORMAL LOW (ref 12.0–15.0)
MCH: 25.4 pg — ABNORMAL LOW (ref 26.0–34.0)
MCHC: 31.9 g/dL (ref 30.0–36.0)
MCV: 79.7 fL — ABNORMAL LOW (ref 80.0–100.0)
Platelets: 328 10*3/uL (ref 150–400)
RBC: 4.48 MIL/uL (ref 3.87–5.11)
RDW: 14.6 % (ref 11.5–15.5)
WBC: 15 10*3/uL — ABNORMAL HIGH (ref 4.0–10.5)
nRBC: 0 % (ref 0.0–0.2)

## 2022-06-09 LAB — LIPASE, BLOOD: Lipase: 38 U/L (ref 11–51)

## 2022-06-09 LAB — HCG, QUANTITATIVE, PREGNANCY: hCG, Beta Chain, Quant, S: 58711 m[IU]/mL — ABNORMAL HIGH (ref <5)

## 2022-06-09 MED ORDER — MORPHINE SULFATE (PF) 2 MG/ML IV SOLN
2.0000 mg | Freq: Once | INTRAVENOUS | Status: AC
  Administered 2022-06-09: 2 mg via INTRAVENOUS
  Filled 2022-06-09: qty 1

## 2022-06-09 MED ORDER — ONDANSETRON 4 MG PO TBDP: ORAL_TABLET | ORAL | 0 refills | Status: DC

## 2022-06-09 MED ORDER — ONDANSETRON HCL 4 MG/2ML IJ SOLN
4.0000 mg | Freq: Once | INTRAMUSCULAR | Status: AC
  Administered 2022-06-09: 4 mg via INTRAVENOUS
  Filled 2022-06-09: qty 2

## 2022-06-09 MED ORDER — SODIUM CHLORIDE 0.9 % IV BOLUS
2000.0000 mL | Freq: Once | INTRAVENOUS | Status: AC
  Administered 2022-06-09: 2000 mL via INTRAVENOUS

## 2022-06-09 MED ORDER — PROMETHAZINE HCL 25 MG/ML IJ SOLN
25.0000 mg | Freq: Once | INTRAMUSCULAR | Status: AC
  Administered 2022-06-10: 25 mg via INTRAMUSCULAR
  Filled 2022-06-09: qty 1

## 2022-06-09 NOTE — Discharge Instructions (Signed)
You must see your OB/GYN doctor next week to discuss your vomiting and your decreasing quantitative beta-hCG.  Also if you have any more problems you need to go to [facility] where your OB/GYN doctor is

## 2022-06-09 NOTE — ED Triage Notes (Signed)
Pt c/o severe LLQ/pelvic pain that started a few hours ago. Pt also c/o continued N/V, pt currently [redacted] weeks pregnant.

## 2022-06-09 NOTE — ED Provider Notes (Signed)
[place] EMERGENCY DEPARTMENT AT [facility] Provider Note   CSN: [id number] Arrival date & time: 06/09/22  2029     History {Add pertinent medical, surgical, social history, OB history to HPI:1} Chief Complaint  Patient presents with   Abdominal Pain    Zoe Hunter is a 27 y.o. female.  Patient is [redacted] weeks pregnant.  She has had hyperemesis gravidarum.  Patient has been evaluated with ultrasounds and an MRI of her abdomen.  She presents today with vomiting again and some mild abdominal discomfort  The history is provided by the patient and medical records. A language interpreter was used.  Abdominal Pain Pain location:  Generalized Pain quality: aching   Pain radiates to:  Does not radiate Pain severity:  Mild Onset quality:  Gradual Timing:  Intermittent Progression:  Resolved Associated symptoms: vomiting   Associated symptoms: no chest pain, no cough, no diarrhea, no fatigue and no hematuria        Home Medications Prior to Admission medications   Medication Sig Start Date End Date Taking? Authorizing Provider  ondansetron (ZOFRAN-ODT) 4 MG disintegrating tablet  ODT q4 hours prn nausea/vomit 06/09/22  Yes [name], MD  amphetamine-dextroamphetamine (ADDERALL) 30 MG tablet Take 30 mg by mouth daily. cBC Patient not taking: Reported on 05/22/2022    [provider]  aspirin EC 81 MG tablet Take 1 tablet (81 mg total) by mouth daily. Swallow whole. 05/29/22   Sonny Dandy, CNM      Allergies    Patient has no known allergies.    Review of Systems   Review of Systems  Constitutional:  Negative for appetite change and fatigue.  HENT:  Negative for congestion, ear discharge and sinus pressure.   Eyes:  Negative for discharge.  Respiratory:  Negative for cough.   Cardiovascular:  Negative for chest pain.  Gastrointestinal:  Positive for abdominal pain and vomiting. Negative for diarrhea.  Genitourinary:  Negative for  frequency and hematuria.  Musculoskeletal:  Negative for back pain.  Skin:  Negative for rash.  Neurological:  Negative for seizures and headaches.  Psychiatric/Behavioral:  Negative for hallucinations.     Physical Exam Updated Vital Signs BP (!) 141/84 (BP Location: Right Arm)   Pulse 96   Temp 99.6 F (37.6 C) (Oral)   Resp (!) 24   Ht  (1.6 m)   Wt 108.9 kg   LMP 11/16/2021 (Exact Date)   SpO2 100%   BMI 42.51 kg/m  Physical Exam Vitals and nursing note reviewed.  Constitutional:      Appearance: She is well-developed.  HENT:     Head: Normocephalic.     Mouth/Throat:     Mouth: Mucous membranes are moist.  Eyes:     General: No scleral icterus.    Conjunctiva/sclera: Conjunctivae normal.  Neck:     Thyroid: No thyromegaly.  Cardiovascular:     Rate and Rhythm: Normal rate and regular rhythm.     Heart sounds: No murmur heard.    No friction rub. No gallop.  Pulmonary:     Breath sounds: No stridor. No wheezing or rales.  Chest:     Chest wall: No tenderness.  Abdominal:     General: There is no distension.     Tenderness: There is abdominal tenderness. There is no rebound.  Musculoskeletal:        General: Normal range of motion.     Cervical back: Neck supple.  Lymphadenopathy:  Cervical: No cervical adenopathy.  Skin:    Findings: No erythema or rash.  Neurological:     Mental Status: She is alert and oriented to person, place, and time.     Motor: No abnormal muscle tone.     Coordination: Coordination normal.  Psychiatric:        Behavior: Behavior normal.     ED Results / Procedures / Treatments   Labs (all labs ordered are listed, but only abnormal results are displayed) Labs Reviewed  COMPREHENSIVE METABOLIC PANEL - Abnormal; Notable for the following components:      Result Value   BUN <5 (*)    Calcium 8.7 (*)    Albumin 3.4 (*)    AST 13 (*)    All other components within normal limits  CBC - Abnormal; Notable for the  following components:   WBC 15.0 (*)    Hemoglobin 11.4 (*)    HCT 35.7 (*)    MCV 79.7 (*)    MCH 25.4 (*)    All other components within normal limits  HCG, QUANTITATIVE, PREGNANCY - Abnormal; Notable for the following components:   hCG, Beta Chain, Quant, S 58,711 (*)    All other components within normal limits  LIPASE, BLOOD  URINALYSIS, ROUTINE W REFLEX MICROSCOPIC    EKG None  Radiology No results found.  Procedures Procedures  {Document cardiac monitor, telemetry assessment procedure when appropriate:1}  Medications Ordered in ED Medications  sodium chloride 0.9 % bolus 2,000 mL (2,000 mLs Intravenous New Bag/Given 06/09/22 2223)  ondansetron (ZOFRAN) injection 4 mg (4 mg Intravenous Given 06/09/22 2212)  morphine (PF) 2 MG/ML injection 2 mg (2 mg Intravenous Given 06/09/22 2212)    ED Course/ Medical Decision Making/ A&P   {   Click here for ABCD2, HEART and other calculatorsREFRESH Note before signing :1}                          Medical Decision Making Amount and/or Complexity of Data Reviewed Labs: ordered.  Risk Prescription drug management.   Patient with hyperemesis gravidarum.  She is sent home with Zofran and instructed to follow-up with her OB/GYN doctor next week.  She was told that her quantitative hCG was declining  {Document critical care time when appropriate:1} {Document review of labs and clinical decision tools ie heart score, Chads2Vasc2 etc:1}  {Document your independent review of radiology images, and any outside records:1} {Document your discussion with family members, caretakers, and with consultants:1} {Document social determinants of health affecting pt's care:1} {Document your decision making why or why not admission, treatments were needed:1} Final Clinical Impression(s) / ED Diagnoses Final diagnoses:  Hyperemesis gravidarum    Rx / DC Orders ED Discharge Orders          Ordered    ondansetron (ZOFRAN-ODT) 4 MG  disintegrating tablet        06/09/22 2314

## 2022-06-10 MED ORDER — ACETAMINOPHEN 500 MG PO TABS
1000.0000 mg | ORAL_TABLET | Freq: Once | ORAL | Status: DC
  Filled 2022-06-10: qty 2

## 2022-06-10 NOTE — ED Provider Notes (Signed)
Fluids have finished.  Bedside ultrasound confirms good fetal movement.  Unable to quantify heart rate.  Patient continues to complain of some abdominal pain.  Recent workup to include MRI that was negative.  I have referred her back to her OB/GYN.   Shon Baton, MD 06/10/22 [phone]

## 2022-06-12 DIAGNOSIS — M533 Sacrococcygeal disorders, not elsewhere classified: Secondary | ICD-10-CM | POA: Diagnosis not present

## 2022-06-12 DIAGNOSIS — M25551 Pain in right hip: Secondary | ICD-10-CM | POA: Diagnosis not present

## 2022-06-12 DIAGNOSIS — M25552 Pain in left hip: Secondary | ICD-10-CM | POA: Diagnosis not present

## 2022-06-12 DIAGNOSIS — O99891 Other specified diseases and conditions complicating pregnancy: Secondary | ICD-10-CM | POA: Diagnosis not present

## 2022-06-12 DIAGNOSIS — Z3A13 13 weeks gestation of pregnancy: Secondary | ICD-10-CM | POA: Diagnosis not present

## 2022-06-12 DIAGNOSIS — O26891 Other specified pregnancy related conditions, first trimester: Secondary | ICD-10-CM | POA: Diagnosis not present

## 2022-06-12 DIAGNOSIS — R102 Pelvic and perineal pain: Secondary | ICD-10-CM | POA: Diagnosis not present

## 2022-06-15 DIAGNOSIS — F331 Major depressive disorder, recurrent, moderate: Secondary | ICD-10-CM | POA: Diagnosis not present

## 2022-06-15 DIAGNOSIS — F411 Generalized anxiety disorder: Secondary | ICD-10-CM | POA: Diagnosis not present

## 2022-06-15 DIAGNOSIS — F902 Attention-deficit hyperactivity disorder, combined type: Secondary | ICD-10-CM | POA: Diagnosis not present

## 2022-06-16 DIAGNOSIS — Z3A13 13 weeks gestation of pregnancy: Secondary | ICD-10-CM | POA: Diagnosis not present

## 2022-06-16 DIAGNOSIS — O26891 Other specified pregnancy related conditions, first trimester: Secondary | ICD-10-CM | POA: Diagnosis not present

## 2022-06-16 DIAGNOSIS — M533 Sacrococcygeal disorders, not elsewhere classified: Secondary | ICD-10-CM | POA: Diagnosis not present

## 2022-06-16 DIAGNOSIS — M25552 Pain in left hip: Secondary | ICD-10-CM | POA: Diagnosis not present

## 2022-06-16 DIAGNOSIS — M25551 Pain in right hip: Secondary | ICD-10-CM | POA: Diagnosis not present

## 2022-06-16 DIAGNOSIS — R102 Pelvic and perineal pain: Secondary | ICD-10-CM | POA: Diagnosis not present

## 2022-06-16 DIAGNOSIS — O99891 Other specified diseases and conditions complicating pregnancy: Secondary | ICD-10-CM | POA: Diagnosis not present

## 2022-06-19 DIAGNOSIS — O99891 Other specified diseases and conditions complicating pregnancy: Secondary | ICD-10-CM | POA: Diagnosis not present

## 2022-06-19 DIAGNOSIS — M25551 Pain in right hip: Secondary | ICD-10-CM | POA: Diagnosis not present

## 2022-06-19 DIAGNOSIS — Z3A13 13 weeks gestation of pregnancy: Secondary | ICD-10-CM | POA: Diagnosis not present

## 2022-06-19 DIAGNOSIS — O26891 Other specified pregnancy related conditions, first trimester: Secondary | ICD-10-CM | POA: Diagnosis not present

## 2022-06-19 DIAGNOSIS — M25552 Pain in left hip: Secondary | ICD-10-CM | POA: Diagnosis not present

## 2022-06-19 DIAGNOSIS — M533 Sacrococcygeal disorders, not elsewhere classified: Secondary | ICD-10-CM | POA: Diagnosis not present

## 2022-06-19 DIAGNOSIS — R102 Pelvic and perineal pain: Secondary | ICD-10-CM | POA: Diagnosis not present

## 2022-06-20 DIAGNOSIS — F902 Attention-deficit hyperactivity disorder, combined type: Secondary | ICD-10-CM | POA: Diagnosis not present

## 2022-06-20 DIAGNOSIS — F331 Major depressive disorder, recurrent, moderate: Secondary | ICD-10-CM | POA: Diagnosis not present

## 2022-06-20 DIAGNOSIS — F411 Generalized anxiety disorder: Secondary | ICD-10-CM | POA: Diagnosis not present

## 2022-06-22 DIAGNOSIS — M533 Sacrococcygeal disorders, not elsewhere classified: Secondary | ICD-10-CM | POA: Diagnosis not present

## 2022-06-22 DIAGNOSIS — M25551 Pain in right hip: Secondary | ICD-10-CM | POA: Diagnosis not present

## 2022-06-22 DIAGNOSIS — O99891 Other specified diseases and conditions complicating pregnancy: Secondary | ICD-10-CM | POA: Diagnosis not present

## 2022-06-22 DIAGNOSIS — O26891 Other specified pregnancy related conditions, first trimester: Secondary | ICD-10-CM | POA: Diagnosis not present

## 2022-06-22 DIAGNOSIS — M25552 Pain in left hip: Secondary | ICD-10-CM | POA: Diagnosis not present

## 2022-06-22 DIAGNOSIS — R102 Pelvic and perineal pain: Secondary | ICD-10-CM | POA: Diagnosis not present

## 2022-06-22 DIAGNOSIS — Z3A13 13 weeks gestation of pregnancy: Secondary | ICD-10-CM | POA: Diagnosis not present

## 2022-06-28 DIAGNOSIS — Z419 Encounter for procedure for purposes other than remedying health state, unspecified: Secondary | ICD-10-CM | POA: Diagnosis not present

## 2022-06-28 NOTE — Transitions of Care (Post Inpatient/ED Visit) (Signed)
   06/28/2022  Name: Zoe Hunter MRN: [id number] DOB: 1996-02-28  Today's TOC FU Call Status: Today's TOC FU Call Status:: Unsuccessul Call (1st Attempt) Unsuccessful Call (1st Attempt) Date: 06/06/22  Attempted to reach the patient regarding the most recent Inpatient/ED visit.  Follow Up Plan: No further outreach attempts will be made at this time. We have been unable to contact the patient.  Signature Agnes Lawrence, CMA (AAMA)  [facility] [phone]

## 2022-06-29 DIAGNOSIS — F419 Anxiety disorder, unspecified: Secondary | ICD-10-CM | POA: Insufficient documentation

## 2022-06-29 DIAGNOSIS — O99712 Diseases of the skin and subcutaneous tissue complicating pregnancy, second trimester: Secondary | ICD-10-CM | POA: Diagnosis not present

## 2022-06-29 DIAGNOSIS — Z3689 Encounter for other specified antenatal screening: Secondary | ICD-10-CM | POA: Diagnosis not present

## 2022-06-29 DIAGNOSIS — L299 Pruritus, unspecified: Secondary | ICD-10-CM | POA: Diagnosis not present

## 2022-07-05 DIAGNOSIS — R102 Pelvic and perineal pain: Secondary | ICD-10-CM | POA: Diagnosis not present

## 2022-07-05 DIAGNOSIS — M25551 Pain in right hip: Secondary | ICD-10-CM | POA: Diagnosis not present

## 2022-07-05 DIAGNOSIS — M533 Sacrococcygeal disorders, not elsewhere classified: Secondary | ICD-10-CM | POA: Diagnosis not present

## 2022-07-05 DIAGNOSIS — Z3A13 13 weeks gestation of pregnancy: Secondary | ICD-10-CM | POA: Diagnosis not present

## 2022-07-05 DIAGNOSIS — M25552 Pain in left hip: Secondary | ICD-10-CM | POA: Diagnosis not present

## 2022-07-05 DIAGNOSIS — O26891 Other specified pregnancy related conditions, first trimester: Secondary | ICD-10-CM | POA: Diagnosis not present

## 2022-07-05 DIAGNOSIS — O99891 Other specified diseases and conditions complicating pregnancy: Secondary | ICD-10-CM | POA: Diagnosis not present

## 2022-07-06 DIAGNOSIS — F331 Major depressive disorder, recurrent, moderate: Secondary | ICD-10-CM | POA: Diagnosis not present

## 2022-07-06 DIAGNOSIS — F411 Generalized anxiety disorder: Secondary | ICD-10-CM | POA: Diagnosis not present

## 2022-07-06 DIAGNOSIS — F902 Attention-deficit hyperactivity disorder, combined type: Secondary | ICD-10-CM | POA: Diagnosis not present

## 2022-07-10 DIAGNOSIS — F902 Attention-deficit hyperactivity disorder, combined type: Secondary | ICD-10-CM | POA: Diagnosis not present

## 2022-07-10 DIAGNOSIS — F411 Generalized anxiety disorder: Secondary | ICD-10-CM | POA: Diagnosis not present

## 2022-07-10 DIAGNOSIS — F331 Major depressive disorder, recurrent, moderate: Secondary | ICD-10-CM | POA: Diagnosis not present

## 2022-07-17 DIAGNOSIS — Z3689 Encounter for other specified antenatal screening: Secondary | ICD-10-CM | POA: Diagnosis not present

## 2022-07-17 DIAGNOSIS — M549 Dorsalgia, unspecified: Secondary | ICD-10-CM | POA: Diagnosis not present

## 2022-07-17 DIAGNOSIS — K219 Gastro-esophageal reflux disease without esophagitis: Secondary | ICD-10-CM | POA: Diagnosis not present

## 2022-07-17 DIAGNOSIS — O99891 Other specified diseases and conditions complicating pregnancy: Secondary | ICD-10-CM | POA: Diagnosis not present

## 2022-07-18 ENCOUNTER — Emergency Department
Admission: EM | Admit: 2022-07-18 | Discharge: 2022-07-18 | Disposition: A | Discharge status: Home / Self Care | Payer: Medicaid Other | Attending: Emergency Medicine | Admitting: Emergency Medicine

## 2022-07-18 ENCOUNTER — Other Ambulatory Visit: Payer: Self-pay

## 2022-07-18 ENCOUNTER — Emergency Department: Payer: Medicaid Other

## 2022-07-18 ENCOUNTER — Encounter: Payer: Self-pay | Admitting: *Deleted

## 2022-07-18 DIAGNOSIS — O26892 Other specified pregnancy related conditions, second trimester: Secondary | ICD-10-CM | POA: Diagnosis not present

## 2022-07-18 DIAGNOSIS — R1012 Left upper quadrant pain: Secondary | ICD-10-CM | POA: Diagnosis not present

## 2022-07-18 DIAGNOSIS — Z3A19 19 weeks gestation of pregnancy: Secondary | ICD-10-CM | POA: Diagnosis not present

## 2022-07-18 LAB — URINALYSIS, ROUTINE W REFLEX MICROSCOPIC
Bilirubin Urine: NEGATIVE
Glucose, UA: NEGATIVE mg/dL
Hgb urine dipstick: NEGATIVE
Ketones, ur: 5 mg/dL — AB
Leukocytes,Ua: NEGATIVE
Nitrite: NEGATIVE
Protein, ur: NEGATIVE mg/dL
Specific Gravity, Urine: 1.023 (ref 1.005–1.030)
pH: 5 (ref 5.0–8.0)

## 2022-07-18 LAB — COMPREHENSIVE METABOLIC PANEL
ALT: 12 U/L (ref 0–44)
AST: 21 U/L (ref 15–41)
Albumin: 3.5 g/dL (ref 3.5–5.0)
Alkaline Phosphatase: 78 U/L (ref 38–126)
Anion gap: 10 (ref 5–15)
BUN: 6 mg/dL (ref 6–20)
CO2: 23 mmol/L (ref 22–32)
Calcium: 8.9 mg/dL (ref 8.9–10.3)
Chloride: 102 mmol/L (ref 98–111)
Creatinine, Ser: 0.54 mg/dL (ref 0.44–1.00)
GFR, Estimated: >60 mL/min (ref >60)
Glucose, Bld: 112 mg/dL — ABNORMAL HIGH (ref 70–99)
Potassium: 3.6 mmol/L (ref 3.5–5.1)
Sodium: 135 mmol/L (ref 135–145)
Total Bilirubin: 0.6 mg/dL (ref 0.3–1.2)
Total Protein: 7.4 g/dL (ref 6.5–8.1)

## 2022-07-18 LAB — CBC WITH DIFFERENTIAL/PLATELET
Abs Immature Granulocytes: 0.06 10*3/uL (ref 0.00–0.07)
Basophils Absolute: 0 10*3/uL (ref 0.0–0.1)
Basophils Relative: 0 %
Eosinophils Absolute: 0 10*3/uL (ref 0.0–0.5)
Eosinophils Relative: 0 %
HCT: 33.6 % — ABNORMAL LOW (ref 36.0–46.0)
Hemoglobin: 10.6 g/dL — ABNORMAL LOW (ref 12.0–15.0)
Immature Granulocytes: 1 %
Lymphocytes Relative: 19 %
Lymphs Abs: 2.4 10*3/uL (ref 0.7–4.0)
MCH: 25 pg — ABNORMAL LOW (ref 26.0–34.0)
MCHC: 31.5 g/dL (ref 30.0–36.0)
MCV: 79.2 fL — ABNORMAL LOW (ref 80.0–100.0)
Monocytes Absolute: 0.4 10*3/uL (ref 0.1–1.0)
Monocytes Relative: 3 %
Neutro Abs: 9.7 10*3/uL — ABNORMAL HIGH (ref 1.7–7.7)
Neutrophils Relative %: 77 %
Platelets: 381 10*3/uL (ref 150–400)
RBC: 4.24 MIL/uL (ref 3.87–5.11)
RDW: 13.8 % (ref 11.5–15.5)
WBC: 12.7 10*3/uL — ABNORMAL HIGH (ref 4.0–10.5)
nRBC: 0 % (ref 0.0–0.2)

## 2022-07-18 LAB — HCG, QUANTITATIVE, PREGNANCY: hCG, Beta Chain, Quant, S: 35845 m[IU]/mL — ABNORMAL HIGH (ref <5)

## 2022-07-18 LAB — CBG MONITORING, ED: Glucose-Capillary: 109 mg/dL — ABNORMAL HIGH (ref 70–99)

## 2022-07-18 LAB — LIPASE, BLOOD: Lipase: 27 U/L (ref 11–51)

## 2022-07-18 MED ORDER — SODIUM CHLORIDE 0.9 % IV BOLUS
1000.0000 mL | Freq: Once | INTRAVENOUS | Status: AC
  Administered 2022-07-18: 1000 mL via INTRAVENOUS

## 2022-07-18 MED ORDER — ONDANSETRON HCL 4 MG/2ML IJ SOLN
4.0000 mg | Freq: Once | INTRAMUSCULAR | Status: AC
  Administered 2022-07-18: 4 mg via INTRAVENOUS
  Filled 2022-07-18: qty 2

## 2022-07-18 MED ORDER — MORPHINE SULFATE (PF) 4 MG/ML IV SOLN
4.0000 mg | Freq: Once | INTRAVENOUS | Status: AC
  Administered 2022-07-18: 4 mg via INTRAVENOUS
  Filled 2022-07-18: qty 1

## 2022-07-18 MED ORDER — HYDROCODONE-ACETAMINOPHEN 5-325 MG PO TABS: 1.0000 | ORAL_TABLET | Freq: Four times a day (QID) | ORAL | 0 refills | Status: AC | PRN

## 2022-07-18 NOTE — ED Notes (Signed)
US at the bedside

## 2022-07-18 NOTE — ED Notes (Signed)
Pt appears more comfortable, states that the pain is reduced after pain medication

## 2022-07-18 NOTE — ED Notes (Addendum)
Pt reports that pain is sharp, it is in LUQ and left flank, no leaking of fluids from vagina

## 2022-07-18 NOTE — ED Notes (Signed)
Into b/r by w/c

## 2022-07-18 NOTE — ED Notes (Signed)
OB RN into triage room during triage. Last [place] at 10 weeks. FHT 145-150

## 2022-07-18 NOTE — Progress Notes (Signed)
Rn auscultated FHR. Regular rhythm. FHT 150. Pt in ED with RN performing triage.

## 2022-07-18 NOTE — ED Triage Notes (Addendum)
BIB family from home for abd pain and NVD associated with 19 weeks of pregnancy. Sx onset Friday 5d ago. Vx2 in last 24 hrs. Dx1 in last 24 hrs. No sick contacts, no travel or suspicious foods. Describes pain as LUQ, with contractions in lower pelvic. Mentions minuscule dot of dried pinkish-brown spot of blood. G2P1[name]. Rates pain 6/10. Mentions 1 contraction 1 hr ago.

## 2022-07-18 NOTE — ED Provider Notes (Signed)
[facility] Provider Note    Event Date/Time   First MD Initiated Contact with Patient 07/18/22 1727     (approximate)   History   Emesis During Pregnancy and Abdominal Pain   HPI  Zoe Hunter is a 27 y.o. female at [redacted] weeks gestation presenting to the emergency department for evaluation of abdominal pain and vomiting.  Several days of abdominal pain.  2 episodes of vomiting over the last 24 hours.  She saw her OB/GYN yesterday who thought it may be related to pain from an ulcer and placed her on Pepcid.  Had worsening pain today leading to ER presentation.  No recent vaginal bleeding, had prior episode of spotting that her OB/GYN is aware of.  Has not yet felt baby move.  No gush of fluid.      Physical Exam   Triage Vital Signs: ED Triage Vitals  Enc Vitals Group     BP 07/18/22 1544 116/72     Pulse Rate 07/18/22 1544 85     Resp 07/18/22 1544 18     Temp 07/18/22 1544 98.6 F (37 C)     Temp Source 07/18/22 1544 Oral     SpO2 07/18/22 1544 98 %     Weight 07/18/22 1552 249 lb (112.9 kg)     Height 07/18/22 1552 5\' 3"  (1.6 m)     Head Circumference --      Peak Flow --      Pain Score 07/18/22 1551 6     Pain Loc --      Pain Edu? --      Excl. in GC? --     Most recent vital signs: Vitals:   07/18/22 1800 07/18/22 2130  BP: (!) 105/53 103/66  Pulse: 81 80  Resp:  18  Temp:  98.5 F (36.9 C)  SpO2: 98% 99%     General: Awake, interactive  CV:  Regular rate, good peripheral perfusion.  Resp:  Lungs clear, unlabored respirations.  Abd:  Soft, nondistended, mild tenderness to palpation in the left upper quadrant without rebound or guarding.  No significant epigastric tenderness.  Negative Murphy sign, no tenderness along with Burney's point or throughout the right abdomen. Neuro:  Symmetric facial movement, fluid speech   ED Results / Procedures / Treatments   Labs (all labs ordered are listed, but only abnormal results are  displayed) Labs Reviewed  COMPREHENSIVE METABOLIC PANEL - Abnormal; Notable for the following components:      Result Value   Glucose, Bld 112 (*)    All other components within normal limits  URINALYSIS, ROUTINE W REFLEX MICROSCOPIC - Abnormal; Notable for the following components:   Color, Urine YELLOW (*)    APPearance HAZY (*)    Ketones, ur 5 (*)    All other components within normal limits  CBC WITH DIFFERENTIAL/PLATELET - Abnormal; Notable for the following components:   WBC 12.7 (*)    Hemoglobin 10.6 (*)    HCT 33.6 (*)    MCV 79.2 (*)    MCH 25.0 (*)    Neutro Abs 9.7 (*)    All other components within normal limits  HCG, QUANTITATIVE, PREGNANCY - Abnormal; Notable for the following components:   hCG, Beta Chain, Quant, S [id number] (*)    All other components within normal limits  CBG MONITORING, ED - Abnormal; Notable for the following components:   Glucose-Capillary 109 (*)    All other components within normal limits  LIPASE, BLOOD     EKG EKG independently reviewed interpreted by myself (ER attending) demonstrates:    RADIOLOGY Imaging independently reviewed and interpreted by myself demonstrates:  Renal ultrasound without evidence of hydronephrosis suggestive of renal stone  PROCEDURES:  Critical Care performed: No  Procedures   MEDICATIONS ORDERED IN ED: Medications  ondansetron (ZOFRAN) injection 4 mg (4 mg Intravenous Given 07/18/22 1809)  sodium chloride 0.9 % bolus 1,000 mL (0 mLs Intravenous Stopped 07/18/22 2126)  morphine (PF) 4 MG/ML injection 4 mg (4 mg Intravenous Given 07/18/22 1809)     IMPRESSION / MDM / ASSESSMENT AND PLAN / ED COURSE  I reviewed the triage vital signs and the nursing notes.  Differential diagnosis includes, but is not limited to, pregnancy related abdominal pain, renal stone, pancreatitis, low suspicion appendicitis given left upper quadrant location of pain.  Patient's presentation is most consistent with acute  presentation with potential threat to life or bodily function.  27 year old female presenting to the emerged department for evaluation of abdominal pain in the setting of pregnancy.  Abdominal exam is overall reassuring, but does have some left upper quadrant tenderness.  Ultrasound without collecting system dilation, lower suspicion for an obstructing stone.  After discussion of risks and benefits, patient did wish to proceed with symptomatic treatment including morphine, Zofran.  She was also given IV fluids.  On reevaluation she felt much improved.  She did feel that she could be safely discharged home with continued outpatient follow-up.  Did discuss risks of opioids in pregnancy, but patient did wish to have a short course of narcotic pain medicine available should her pain worsen.  After shared decision making, will DC with very short course of Norco.  Does have nausea medicine available.  Strict return precautions were provided.  She was discharged in stable condition.     FINAL CLINICAL IMPRESSION(S) / ED DIAGNOSES   Final diagnoses:  Left upper quadrant abdominal pain     Rx / DC Orders   ED Discharge Orders          Ordered    HYDROcodone-acetaminophen (NORCO) 5-325 MG tablet  Every 6 hours PRN        07/18/22 2104             Note:  This document was prepared using Dragon voice recognition software and may include unintentional dictation errors.   Trinna Post, MD 07/18/22 [phone]

## 2022-07-18 NOTE — Discharge Instructions (Addendum)
You were seen in the emergency department today for evaluation of your abdominal pain.  Your lab work and ultrasound of your kidneys were reassuring.  Your urine did not show signs of infection.  You can continue to take the Pepcid that you have been taking.  As we discussed, pain medicines can affect your pregnancy particularly when taken frequently, but if you are having severe pain I have sent a short course of as needed medication to your pharmacy.  Follow-up closely with your OB/GYN.  Return to the ER for any new or worsening symptoms including worsening abdominal pain, vaginal bleeding, loss of fluid, or any other new or concerning symptoms.

## 2022-07-19 ENCOUNTER — Telehealth: Payer: Self-pay

## 2022-07-19 DIAGNOSIS — R197 Diarrhea, unspecified: Secondary | ICD-10-CM | POA: Diagnosis not present

## 2022-07-19 DIAGNOSIS — O99891 Other specified diseases and conditions complicating pregnancy: Secondary | ICD-10-CM | POA: Diagnosis not present

## 2022-07-19 DIAGNOSIS — O219 Vomiting of pregnancy, unspecified: Secondary | ICD-10-CM | POA: Diagnosis not present

## 2022-07-19 DIAGNOSIS — Z79899 Other long term (current) drug therapy: Secondary | ICD-10-CM | POA: Diagnosis not present

## 2022-07-19 DIAGNOSIS — R1012 Left upper quadrant pain: Secondary | ICD-10-CM | POA: Diagnosis not present

## 2022-07-19 DIAGNOSIS — Z7982 Long term (current) use of aspirin: Secondary | ICD-10-CM | POA: Diagnosis not present

## 2022-07-19 DIAGNOSIS — R112 Nausea with vomiting, unspecified: Secondary | ICD-10-CM | POA: Diagnosis not present

## 2022-07-19 DIAGNOSIS — Z3A19 19 weeks gestation of pregnancy: Secondary | ICD-10-CM | POA: Diagnosis not present

## 2022-07-19 NOTE — Transitions of Care (Post Inpatient/ED Visit) (Signed)
   07/19/2022  Name: Zoe Hunter MRN: [id number] DOB: 05-12-1995  Today's TOC FU Call Status: Today's TOC FU Call Status:: Successful TOC FU Call Competed TOC FU Call Complete Date: 07/19/22  Transition Care Management Follow-up Telephone Call Date of Discharge: 07/18/22 Discharge Facility: [facility]) Type of Discharge: Emergency Department Reason for ED Visit: Other: (Left upper quadrant abdominal pain) How have you been since you were released from the hospital?: Worse Any questions or concerns?: Yes Patient Questions/Concerns:: pt states pain is pregnancy related and she has reached out to [facility] but has not recieved a call back yet. I advised pt to go back to ER since symptoms are worsening. pt was reluctant, not sure if she will comply. Patient Questions/Concerns Addressed: Notified Provider of Patient Questions/Concerns  Items Reviewed: Did you receive and understand the discharge instructions provided?: Yes Medications obtained,verified, and reconciled?: Yes (Medications Reviewed) Any new allergies since your discharge?: No Dietary orders reviewed?: NA Do you have support at home?: Yes  Medications Reviewed Today: Medications Reviewed Today     Reviewed by Leigh Aurora, CMA (Certified Medical Assistant) on 07/19/22 at 1425  Med List Status: <None>   Medication Order Taking? Sig Documenting Provider Last Dose Status Informant  Patient not taking:  Discontinued 07/19/22 1424 aspirin EC 81 MG tablet [id number]  Take 1 tablet (81 mg total) by mouth daily. Swallow whole. Chari Manning Rolla Plate, CNM  Active   HYDROcodone-acetaminophen [facility]) 5-325 MG tablet [id number]  Take 1 tablet by mouth every 6 (six) hours as needed for up to 3 days for moderate pain. Trinna Post, MD  Active   ondansetron (ZOFRAN-ODT) 4 MG disintegrating tablet [id number]  4mg  ODT q4 hours prn nausea/vomit [name], MD  Active             Home Care and  Equipment/Supplies: Were Home Health Services Ordered?: NA Any new equipment or medical supplies ordered?: NA  Functional Questionnaire: Do you need assistance with bathing/showering or dressing?: No Do you need assistance with meal preparation?: No Do you need assistance with eating?: No Do you have difficulty maintaining continence: No Do you need assistance with getting out of bed/getting out of a chair/moving?: No Do you have difficulty managing or taking your medications?: No  Follow up appointments reviewed: PCP Follow-up appointment confirmed?: NA Specialist Hospital Follow-up appointment confirmed?: No (pt is waiting for a call back from OBGYN) Reason Specialist Follow-Up Not Confirmed: Patient has Specialist Provider Number and will Call for Appointment Do you need transportation to your follow-up appointment?: No Do you understand care options if your condition(s) worsen?: Yes-patient verbalized understanding  Agnes Lawrence, CMA [place])  [facility]- [facility] [phone]  SIGNATURE  Agnes Lawrence, CMA (AAMA)  [facility]- [facility] [phone]

## 2022-07-20 ENCOUNTER — Telehealth: Payer: Self-pay

## 2022-07-20 ENCOUNTER — Emergency Department
Admission: EM | Admit: 2022-07-20 | Discharge: 2022-07-20 | Disposition: A | Discharge status: Home / Self Care | Payer: Medicaid Other | Attending: Emergency Medicine | Admitting: Emergency Medicine

## 2022-07-20 ENCOUNTER — Other Ambulatory Visit: Payer: Self-pay

## 2022-07-20 DIAGNOSIS — O26892 Other specified pregnancy related conditions, second trimester: Secondary | ICD-10-CM | POA: Diagnosis not present

## 2022-07-20 DIAGNOSIS — R1012 Left upper quadrant pain: Secondary | ICD-10-CM

## 2022-07-20 DIAGNOSIS — Z3A19 19 weeks gestation of pregnancy: Secondary | ICD-10-CM | POA: Insufficient documentation

## 2022-07-20 LAB — COMPREHENSIVE METABOLIC PANEL
ALT: 16 U/L (ref 0–44)
AST: 21 U/L (ref 15–41)
Albumin: 3.5 g/dL (ref 3.5–5.0)
Alkaline Phosphatase: 80 U/L (ref 38–126)
Anion gap: 10 (ref 5–15)
BUN: <5 mg/dL — ABNORMAL LOW (ref 6–20)
CO2: 22 mmol/L (ref 22–32)
Calcium: 9.1 mg/dL (ref 8.9–10.3)
Chloride: 103 mmol/L (ref 98–111)
Creatinine, Ser: 0.52 mg/dL (ref 0.44–1.00)
GFR, Estimated: >60 mL/min (ref >60)
Glucose, Bld: 92 mg/dL (ref 70–99)
Potassium: 3.6 mmol/L (ref 3.5–5.1)
Sodium: 135 mmol/L (ref 135–145)
Total Bilirubin: 0.7 mg/dL (ref 0.3–1.2)
Total Protein: 7.5 g/dL (ref 6.5–8.1)

## 2022-07-20 LAB — CBC
HCT: 34.4 % — ABNORMAL LOW (ref 36.0–46.0)
Hemoglobin: 11.1 g/dL — ABNORMAL LOW (ref 12.0–15.0)
MCH: 25.4 pg — ABNORMAL LOW (ref 26.0–34.0)
MCHC: 32.3 g/dL (ref 30.0–36.0)
MCV: 78.7 fL — ABNORMAL LOW (ref 80.0–100.0)
Platelets: 371 10*3/uL (ref 150–400)
RBC: 4.37 MIL/uL (ref 3.87–5.11)
RDW: 13.8 % (ref 11.5–15.5)
WBC: 11.3 10*3/uL — ABNORMAL HIGH (ref 4.0–10.5)
nRBC: 0 % (ref 0.0–0.2)

## 2022-07-20 LAB — LIPASE, BLOOD: Lipase: 32 U/L (ref 11–51)

## 2022-07-20 MED ORDER — DIPHENHYDRAMINE HCL 50 MG/ML IJ SOLN
25.0000 mg | Freq: Once | INTRAMUSCULAR | Status: AC
  Administered 2022-07-20: 25 mg via INTRAVENOUS
  Filled 2022-07-20: qty 1

## 2022-07-20 MED ORDER — METOCLOPRAMIDE HCL 5 MG/ML IJ SOLN
10.0000 mg | Freq: Once | INTRAMUSCULAR | Status: AC
  Administered 2022-07-20: 10 mg via INTRAVENOUS
  Filled 2022-07-20: qty 2

## 2022-07-20 MED ORDER — LACTATED RINGERS IV BOLUS
1000.0000 mL | Freq: Once | INTRAVENOUS | Status: AC
  Administered 2022-07-20: 1000 mL via INTRAVENOUS

## 2022-07-20 NOTE — ED Triage Notes (Signed)
Pt reports she is [redacted] wks pregnant. Reports severe LUQ abd pain. Seen here yesterday for same and earlier tonight at [facility] ED. Pt reports n/v and decreased oral intake x 6 days. Pt unable to sit still in triage. Noted to be stamping her feet in place, rocking back in forth, yelling, and crying. Pt alert and oriented. Ambulatory to triage. Reports during her first pregnancy she had similar problems and was hospitalized ~20 times. Pt denies vaginal bleeding or discharge. Reports she has been told that she has an ulcer and is unable to do an endoscopy to formally diagnose. Pt reports 5 lb weight loss.

## 2022-07-20 NOTE — ED Notes (Addendum)
Pt was very uncooperative.  This RN put an IV in on her L ac with good blood return and got it one stick. Pt yelling and saying that her, "IV IS BURNING" Attempts to calm pt down have been made, pt writhing around in bed, yelling, screaming and cussing at this RN while she was being told to please be still, to please stop yelling and cussing at this RN. Other nursing staff present and security is near as well. Addendum to the above Pt was asked which arm she preferred to have IV placed.  This RN and another nurse that was in the room, Sherilyn Cooter, [place] did not hear the pt answer "R arm."  The pt also had no issue nor did she verbalize anything to the contrary when the tourniquet and prep was placed/done on L arm for IV placement.

## 2022-07-20 NOTE — Transitions of Care (Post Inpatient/ED Visit) (Signed)
   07/20/2022  Name: Zoe Hunter MRN: [id number] DOB: [birth year]  Today's TOC FU Call Status: Today's TOC FU Call Status:: Successful TOC FU Call Competed TOC FU Call Complete Date: 07/20/22  Transition Care Management Follow-up Telephone Call Date of Discharge: 07/19/21 (07/19/2022) Discharge Facility: [facility]) Type of Discharge: Emergency Department Reason for ED Visit: Other: How have you been since you were released from the hospital?: Same Any questions or concerns?: No  Items Reviewed: Did you receive and understand the discharge instructions provided?: Yes Medications obtained,verified, and reconciled?: Yes (Medications Reviewed) Any new allergies since your discharge?: No Dietary orders reviewed?: NA Do you have support at home?: Yes People in Home: significant other Name of Support/Comfort Primary Source: Zoe Hunter  Medications Reviewed Today: Medications Reviewed Today     Reviewed by Leigh Aurora, CMA (Certified Medical Assistant) on 07/19/22 at 1425  Med List Status: <None>   Medication Order Taking? Sig Documenting Provider Last Dose Status Informant  Patient not taking:  Discontinued 07/19/22 1424 aspirin EC 81 MG tablet [id number]  Take 1 tablet (81 mg total) by mouth daily. Swallow whole. Chari Manning Rolla Plate, CNM  Active   HYDROcodone-acetaminophen [facility]) 5-325 MG tablet [id number]  Take 1 tablet by mouth every 6 (six) hours as needed for up to 3 days for moderate pain. Trinna Post, MD  Active   ondansetron (ZOFRAN-ODT) 4 MG disintegrating tablet [id number]  4mg  ODT q4 hours prn nausea/vomit [name], MD  Active             Home Care and Equipment/Supplies: Were Home Health Services Ordered?: NA Any new equipment or medical supplies ordered?: NA  Functional Questionnaire: Do you need assistance with bathing/showering or dressing?: No Do you need assistance with meal preparation?: No Do you need assistance  with eating?: No Do you have difficulty maintaining continence: No Do you need assistance with getting out of bed/getting out of a chair/moving?: No Do you have difficulty managing or taking your medications?: No  Follow up appointments reviewed: PCP Follow-up appointment confirmed?: NA Specialist Hospital Follow-up appointment confirmed?: Yes Date of Specialist follow-up appointment?: 07/27/22 Follow-Up Specialty Provider:: OBGYN Reason Specialist Follow-Up Not Confirmed: Patient has Specialist Provider Number and will Call for Appointment Do you need transportation to your follow-up appointment?: No Do you understand care options if your condition(s) worsen?: Yes-patient verbalized understanding    SIGNATURE Izetta Dakin

## 2022-07-20 NOTE — ED Provider Notes (Signed)
[facility] Provider Note    Event Date/Time   First MD Initiated Contact with Patient 07/20/22 [phone]     (approximate)   History   abdominal pain pregnant   HPI  Zoe Hunter is a 27 y.o. female currently about [redacted] weeks pregnant who presents because of abdominal pain.  Patient says this is been going on since Friday which has been about 6 days.  Pain is located in the left upper quadrant.  She has had this pain earlier in her pregnancy as well was seen in the ED and had MRI that was negative.  She has also had nausea and vomiting says that she vomits about every 6-12 hours.  Not able to tolerate much p.o.  Says she is lost about 10 pounds in 1 week she does endorse pain with urination but says that she has had recent urinalysis that were negative for infection.  Denies vaginal bleeding.  Patient was seen at [facility] ED earlier this morning just several hours ago.  She was given fluids Protonix morphine and Zofran and was ultimately discharged with outpatient follow-up.  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   GBS bacteriuria 09/10/2018   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      Excl. in GC?     Most recent vital signs: Vitals:   07/20/22 0459  BP: 126/85  Pulse: 99  Resp: (!) 24  Temp: 98.5 F (36.9 C)  SpO2: 97%     General: Awake, patient is yelling in pain about her IV CV:  Good peripheral perfusion.  Resp:  Normal effort.  Abd:  No distention.  Tenderness palpation epigastric region and left upper quadrant with no guarding Neuro:             Awake, Alert, Oriented x 3  Other:     ED Results / Procedures / Treatments  Labs (all labs ordered are listed, but only abnormal results are displayed) Labs Reviewed  COMPREHENSIVE METABOLIC PANEL - Abnormal; Notable  for the following components:      Result Value   BUN <5 (*)    All other components within normal limits  CBC - Abnormal; Notable for the following components:   WBC 11.3 (*)    Hemoglobin 11.1 (*)    HCT 34.4 (*)    MCV 78.7 (*)    MCH 25.4 (*)    All other components within normal limits  LIPASE, BLOOD  URINALYSIS, ROUTINE W REFLEX MICROSCOPIC  POC URINE PREG, ED     EKG     RADIOLOGY    PROCEDURES:  Critical Care performed: No  Procedures MEDICATIONS ORDERED IN ED: Medications  diphenhydrAMINE (BENADRYL) injection 25 mg (25 mg Intravenous Given 07/20/22 0534)  metoCLOPramide (REGLAN) injection 10 mg (10 mg Intravenous Given 07/20/22 0534)  lactated ringers bolus 1,000 mL (1,000 mLs Intravenous New Bag/Given 07/20/22 0532)     IMPRESSION / MDM / ASSESSMENT AND PLAN / ED COURSE  I reviewed the triage vital signs and the nursing notes.                              Patient's  presentation is most consistent with acute complicated illness / injury requiring diagnostic workup.  Differential diagnosis includes, but is not limited to, pancreatitis, peptic ulcer, gastritis, reflux, low suspicion for diverticulitis, appendicitis or perforated viscus, pyelonephritis, kidney stone  Patient is a 27 year old female who is currently about [redacted] weeks pregnant who presents with left upper quadrant abdominal pain nausea and vomiting.  This has been an ongoing issue throughout her pregnancy.  Was seen in the ED yesterday with negative renal ultrasound and reassuring labs and was ultimately discharged.  Has also seen in the ED at [facility] earlier this morning to several hours ago was ultimately discharged after fluids Protonix morphine and GI cocktail.  She endorses multiple episodes of emesis daily and a 10 pound weight loss over the last week.  Pain is in the left upper quadrant and has been constant and worsening.  When I am evaluating the patient she is quite preoccupied with pain from an IV site that was placed in her left arm.  Prior to this she was apparently yelling at staff and security was called.  I was able to verbally de-escalate the situation.  Apparently patient had asked to have the IV placed in the right arm was quite upset that it was placed in the left arm.  IV had good blood return and flushes well but patient was requesting to have it removed.  I discussed with her that I think the best option if we are going to give her IV medications to keep the IV given it works well and I am concerned that if we put in another IV in the other arm it will still be painful to her.  She was okay trying medications to the IV.  Patient later requested to be discharged because she wanted to go to a different hospital, there is she was upset by how she was treated by staff and about her IV being painful.  At this point patient has been evaluated by multiple providers in the last 48 hours and I feel  that she has a capacity to make the decision to leave today.         FINAL CLINICAL IMPRESSION(S) / ED DIAGNOSES   Final diagnoses:  Left upper quadrant abdominal pain     Rx / DC Orders  ED Discharge Orders     None        Note:  This document was prepared using Dragon voice recognition software and may include unintentional dictation errors.   Georga Hacking, MD 07/20/22 [phone]

## 2022-07-20 NOTE — ED Notes (Signed)
Charge Paulino Rily in room to speak with pt per pt request. As this nurse is medicating pt, pt is requesting IV to be removed and to be discharged. MD McHugh in room to speak with pt at this time. IV removed, cath intact, pressure dressing applied. No bleeding noted at site. Pt continues to be verbally abusive to staff, cursing and accusing staff members of mistreatment. Shortly after interaction, first nurse notified this nurse that pt observed leaving ED lobby ambulatory.

## 2022-07-25 DIAGNOSIS — M25551 Pain in right hip: Secondary | ICD-10-CM | POA: Diagnosis not present

## 2022-07-25 DIAGNOSIS — O99891 Other specified diseases and conditions complicating pregnancy: Secondary | ICD-10-CM | POA: Diagnosis not present

## 2022-07-25 DIAGNOSIS — M25552 Pain in left hip: Secondary | ICD-10-CM | POA: Diagnosis not present

## 2022-07-25 DIAGNOSIS — M533 Sacrococcygeal disorders, not elsewhere classified: Secondary | ICD-10-CM | POA: Diagnosis not present

## 2022-07-25 DIAGNOSIS — R102 Pelvic and perineal pain: Secondary | ICD-10-CM | POA: Diagnosis not present

## 2022-07-25 DIAGNOSIS — Z3A13 13 weeks gestation of pregnancy: Secondary | ICD-10-CM | POA: Diagnosis not present

## 2022-07-25 DIAGNOSIS — O26891 Other specified pregnancy related conditions, first trimester: Secondary | ICD-10-CM | POA: Diagnosis not present

## 2022-07-26 DIAGNOSIS — O26891 Other specified pregnancy related conditions, first trimester: Secondary | ICD-10-CM | POA: Diagnosis not present

## 2022-07-26 DIAGNOSIS — R102 Pelvic and perineal pain: Secondary | ICD-10-CM | POA: Diagnosis not present

## 2022-07-26 DIAGNOSIS — Z3A13 13 weeks gestation of pregnancy: Secondary | ICD-10-CM | POA: Diagnosis not present

## 2022-07-26 DIAGNOSIS — M533 Sacrococcygeal disorders, not elsewhere classified: Secondary | ICD-10-CM | POA: Diagnosis not present

## 2022-07-26 DIAGNOSIS — O99891 Other specified diseases and conditions complicating pregnancy: Secondary | ICD-10-CM | POA: Diagnosis not present

## 2022-07-26 DIAGNOSIS — M25552 Pain in left hip: Secondary | ICD-10-CM | POA: Diagnosis not present

## 2022-07-26 DIAGNOSIS — M25551 Pain in right hip: Secondary | ICD-10-CM | POA: Diagnosis not present

## 2022-07-27 DIAGNOSIS — F331 Major depressive disorder, recurrent, moderate: Secondary | ICD-10-CM | POA: Diagnosis not present

## 2022-07-27 DIAGNOSIS — F411 Generalized anxiety disorder: Secondary | ICD-10-CM | POA: Diagnosis not present

## 2022-07-29 DIAGNOSIS — Z419 Encounter for procedure for purposes other than remedying health state, unspecified: Secondary | ICD-10-CM | POA: Diagnosis not present

## 2022-07-31 DIAGNOSIS — F411 Generalized anxiety disorder: Secondary | ICD-10-CM | POA: Diagnosis not present

## 2022-07-31 DIAGNOSIS — F331 Major depressive disorder, recurrent, moderate: Secondary | ICD-10-CM | POA: Diagnosis not present

## 2022-08-01 DIAGNOSIS — M25552 Pain in left hip: Secondary | ICD-10-CM | POA: Diagnosis not present

## 2022-08-01 DIAGNOSIS — O99891 Other specified diseases and conditions complicating pregnancy: Secondary | ICD-10-CM | POA: Diagnosis not present

## 2022-08-01 DIAGNOSIS — M533 Sacrococcygeal disorders, not elsewhere classified: Secondary | ICD-10-CM | POA: Diagnosis not present

## 2022-08-01 DIAGNOSIS — M25551 Pain in right hip: Secondary | ICD-10-CM | POA: Diagnosis not present

## 2022-08-01 DIAGNOSIS — Z3A13 13 weeks gestation of pregnancy: Secondary | ICD-10-CM | POA: Diagnosis not present

## 2022-08-01 DIAGNOSIS — O26891 Other specified pregnancy related conditions, first trimester: Secondary | ICD-10-CM | POA: Diagnosis not present

## 2022-08-01 DIAGNOSIS — R102 Pelvic and perineal pain: Secondary | ICD-10-CM | POA: Diagnosis not present

## 2022-08-02 DIAGNOSIS — Z363 Encounter for antenatal screening for malformations: Secondary | ICD-10-CM | POA: Diagnosis not present

## 2022-08-02 DIAGNOSIS — Z3A21 21 weeks gestation of pregnancy: Secondary | ICD-10-CM | POA: Diagnosis not present

## 2022-08-02 DIAGNOSIS — Z3689 Encounter for other specified antenatal screening: Secondary | ICD-10-CM | POA: Diagnosis not present

## 2022-08-07 ENCOUNTER — Other Ambulatory Visit: Payer: Medicaid Other | Admitting: Obstetrics and Gynecology

## 2022-08-07 DIAGNOSIS — F411 Generalized anxiety disorder: Secondary | ICD-10-CM | POA: Diagnosis not present

## 2022-08-07 DIAGNOSIS — F331 Major depressive disorder, recurrent, moderate: Secondary | ICD-10-CM | POA: Diagnosis not present

## 2022-08-07 NOTE — Patient Outreach (Signed)
  Medicaid Managed Care   Unsuccessful Attempt Note   08/07/2022 Name: Zoe Hunter MRN: [id number] DOB: 05/10/1995  Referred by: [name], MD Reason for referral : High Risk Managed Medicaid (Unsuccessful telephone outreach)  An unsuccessful telephone outreach was attempted today. The patient was referred to the case management team for assistance with care management and care coordination.    Follow Up Plan: The Managed Medicaid care management team will reach out to the patient again over the next 30 business  days. and The  Patient has been provided with contact information for the Managed Medicaid care management team and has been advised to call with any health related questions or concerns.   Kathi Der RN, BSN [place]  [employer] - Managed Medicaid High Risk [phone]

## 2022-08-07 NOTE — Patient Instructions (Signed)
Hi Ms. Benefiel, I am sorry I missed you today - as a part of your Medicaid benefit, you are eligible for care management and care coordination services at no cost or copay. I was unable to reach you by phone today but would be happy to help you with your health related needs. Please feel free to call me at [phone].  A member of the Managed Medicaid care management team will reach out to you again over the next 30 business days.   Kathi Der RN, BSN [place]  [employer] - Managed Medicaid High Risk [phone]

## 2022-08-08 DIAGNOSIS — M25552 Pain in left hip: Secondary | ICD-10-CM | POA: Diagnosis not present

## 2022-08-08 DIAGNOSIS — R102 Pelvic and perineal pain: Secondary | ICD-10-CM | POA: Diagnosis not present

## 2022-08-08 DIAGNOSIS — M25551 Pain in right hip: Secondary | ICD-10-CM | POA: Diagnosis not present

## 2022-08-08 DIAGNOSIS — M533 Sacrococcygeal disorders, not elsewhere classified: Secondary | ICD-10-CM | POA: Diagnosis not present

## 2022-08-08 DIAGNOSIS — Z3A13 13 weeks gestation of pregnancy: Secondary | ICD-10-CM | POA: Diagnosis not present

## 2022-08-08 DIAGNOSIS — O26891 Other specified pregnancy related conditions, first trimester: Secondary | ICD-10-CM | POA: Diagnosis not present

## 2022-08-08 DIAGNOSIS — O99891 Other specified diseases and conditions complicating pregnancy: Secondary | ICD-10-CM | POA: Diagnosis not present

## 2022-08-14 ENCOUNTER — Telehealth: Payer: Self-pay

## 2022-08-14 DIAGNOSIS — F411 Generalized anxiety disorder: Secondary | ICD-10-CM | POA: Diagnosis not present

## 2022-08-14 DIAGNOSIS — F902 Attention-deficit hyperactivity disorder, combined type: Secondary | ICD-10-CM | POA: Diagnosis not present

## 2022-08-14 DIAGNOSIS — F331 Major depressive disorder, recurrent, moderate: Secondary | ICD-10-CM | POA: Diagnosis not present

## 2022-08-14 NOTE — Telephone Encounter (Signed)
..   Medicaid Managed Care   Unsuccessful Outreach Note  08/14/2022 Name: Zoe Hunter MRN: [id number] DOB: 10-16-1995  Referred by: [name], MD Reason for referral : Appointment (I called the patient to reschedule her missed phone appointment with the MM RNCM. I left my name and number on her voicemail.)   A second unsuccessful telephone outreach was attempted today. The patient was referred to the case management team for assistance with care management and care coordination.   Follow Up Plan: A HIPAA compliant phone message was left for the patient providing contact information and requesting a return call.  The care management team will reach out to the patient again over the next 7 days.    Weston Settle Care Guide  [facility] Managed  Care Guide [facility]  [phone]

## 2022-08-18 ENCOUNTER — Encounter: Payer: Self-pay | Admitting: Obstetrics and Gynecology

## 2022-08-18 ENCOUNTER — Other Ambulatory Visit: Payer: Medicaid Other | Admitting: Obstetrics and Gynecology

## 2022-08-18 NOTE — Patient Outreach (Signed)
Medicaid Managed Care   Nurse Care Manager Note  08/18/2022 Name:  Zoe Hunter MRN:  [id number] DOB:  02/06/1996  Zoe Hunter is an 27 y.o. year old female who is a primary patient of Zoe Limerick, MD.  The [facility] Managed Care Coordination team was consulted for assistance with:    GERD, ADHD, BP,  anxiety, depression, adjustment disorder, 23.[redacted] weeks gestation with nausea and vomiting, hip and pelvic pain   [name] was given information about Medicaid Managed Care Coordination team services today. Zoe Hunter Patient agreed to services and verbal consent obtained.  Engaged with patient by telephone for follow up visit in response to provider referral for case management and/or care coordination services.   Assessments/Interventions:  Review of past medical history, allergies, medications, health status, including review of consultants reports, laboratory and other test data, was performed as part of comprehensive evaluation and provision of chronic care management services.  SDOH (Social Determinants of Health) assessments and interventions performed: SDOH Interventions    Flowsheet Row Patient Outreach Telephone from 08/18/2022 in [place] POPULATION HEALTH DEPARTMENT  SDOH Interventions   Alcohol Usage Interventions Intervention Not Indicated (Score <7)  Depression Interventions/Treatment  Currently on Treatment     Care Plan  No Known Allergies  Medications Reviewed Today     Reviewed by Zoe Chandler, RN (Registered Nurse) on 08/18/22 at 1326  Med List Status: <None>   Medication Order Taking? Sig Documenting Provider Last Dose Status Informant  aspirin EC 81 MG tablet [id number]  Take 1 tablet (81 mg total) by mouth daily. Swallow whole.  Patient taking differently: Take 81 mg by mouth daily. 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   GBS bacteriuria 09/10/2018   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   Nausea/vomiting in pregnancy 08/29/2018   LGSIL on Pap smear of cervix 05/24/2017   ADD (attention deficit disorder) 02/19/2015   Bipolar affective disorder (HCC) 02/19/2015   Clinical depression 02/19/2015   Cholelithiasis 01/28/2015   Conditions to be addressed/monitored per PCP order:  GERD, ADHD, BP,  anxiety, depression, adjustment disorder, 23.[redacted] weeks gestation with nausea and vomiting, hip and pelvic pain   Care Plan : RN Care Manager Plan of Care  Updates made by Zoe Chandler, RN since 08/18/2022 12:00 AM     Problem: Health Promotion or Disease Self-Management (General Plan of Care)      Goal: OB Case Management   Start Date: 08/18/2022  Expected End Date: 11/18/2022  Priority: High  Note:   Current Barriers:  Knowledge Deficits related to plan of care for management of GERD, ADHD, BP, anxiety  depression, adjustment disorder, 23.[redacted] weeks gestation with nausea and vomiting, hip and pelvic pain Chronic Disease Management support and education needs related to GERD, ADHD, BP, anxiety depression, adjustment disorder, 23.[redacted] weeks gestation with nausea and vomiting, hip and pelvic pain 08/18/22:  patient with ongoing  nausea and vomiting, although improved-takes Zofran as needed.  H/O pre-eclampsia-no issues right now.  Patient was attending PT twice a week for hip and pelvic pain, but unable to attend right now due to child care issues-has 3yo-is doing home  exercises.  Patient wishes to express concern about patient experience she had at [facility].  Patient states she called and has received no return phone call from the Office of Patient Experience.  Patient continues services with Psychiatrist and Therapist on regular basis.    RNCM Clinical Goal(s):  Patient will verbalize understanding of plan for management of GERD, ADHD, BP,  anxiety, depression, adjustment disorder, 23.[redacted] weeks gestation with nausea and vomiting, hip and pelvic pain  as evidenced by patient report verbalize basic understanding of GERD, ADHD, BP,  anxiety, depression, adjustment disorder, 23.[redacted] weeks gestation with nausea and vomiting, hip and pelvic pain  disease process and self health management plan as evidenced by patient report take all medications exactly as prescribed and will call provider for medication related questions as evidenced by patient report demonstrate understanding of rationale for each prescribed medication as evidenced by patient report attend all scheduled medical appointments  as evidenced by patient report and EMR review demonstrate ongoing  adherence to prescribed treatment plan for GERD, ADHD, BP,  anxiety, depression, adjustment disorder, 23.[redacted] weeks gestation with nausea and vomiting, hip and pelvic pain  as evidenced by patient report and EMR review. continue to work with RN Care Manager to address care management and care coordination needs related to  GERD, ADHD, BP,  anxiety, depression, adjustment disorder, 23.[redacted] weeks gestation with nausea and vomiting, hip and pelvic pain as evidenced by adherence to CM Team Scheduled appointments experience decrease in ED visits as evidenced by EMR review.  ED visits in in last 6 months = 5 through collaboration with RN Care manager, provider, and care team.   Interventions: Inter-disciplinary care team collaboration (see longitudinal plan of care) Evaluation of current treatment plan related to  self management and  patient's adherence to plan as established by provider Patient provided with Office of Patient Experience contact information-email. Patient encouraged to continue home PT exercises.    Nausea and Vomiting:  (Status:  New goal.)  Short Term Goal Recommended adequate rest    , Advised to follow routine screening guidelines as outlined by obstetrics provider   , and Reviewed prescribed diet and encouraged optimizing nutrition during pregnancy       Patient Goals/Self-Care Activities: Take all medications as prescribed Attend all scheduled provider appointments Call pharmacy for medication refills 3-7 days in advance of running out of medications Perform all self care activities independently  Perform IADL's (shopping, preparing meals, housekeeping, managing finances) independently Call provider office for new concerns or questions   Follow Up Plan:  The patient has been provided with contact information for the care management team and has been advised to call with any health related questions or concerns.  The care management team will reach out to the patient again over the next 30 business  days.   Goal: Establish Plan of Care for OB Case Management   Priority: High  Note:   Timeframe:  Short-Term Goal Priority:  High Start Date:   08/18/22                          Expected End Date:  ongoing  Follow Up Date 09/13/22    - schedule appointment for vaccines needed due to my age or health - schedule recommended health tests (blood work, mammogram, colonoscopy, pap test) - schedule and keep appointment for annual check-up / OB appts   Why is this important?   Screening tests can find diseases early when they are easier to treat.  Your doctor or nurse will talk with you about which tests are important for you.  Getting shots for common diseases like the flu and shingles will help prevent them.     Follow Up:  Patient agrees to Care Plan and Follow-up.  Plan: The  Managed Medicaid care management team will reach out to the patient again over the next 30 business  days. and The  Patient has been provided with contact information for the Managed Medicaid care management team and has been advised to call with any health related questions or concerns.  Date/time of next scheduled RN care management/care coordination outreach: 09/11/22 at 315.

## 2022-08-18 NOTE — Patient Instructions (Signed)
Hi Zoe Hunter, thank you for speaking with me today, have a nice afternoon and weekend!!  Zoe Hunter was given information about Medicaid Managed Care team care coordination services as a part of their [facility] Medicaid benefit. Zoe Hunter verbally consented to engagement with the [facility] Managed Care team.   If you are experiencing a medical emergency, please call 911 or report to your local emergency department or urgent care.   If you have a non-emergency medical problem during routine business hours, please contact your provider's office and ask to speak with a nurse.   For questions related to your [facility] health plan, please call: [phone] or go here:[email or web address]  If you would like to schedule transportation through your [facility] plan, please call the following number at least 2 days in advance of your appointment: [phone].   You can also use the MTM portal or MTM mobile app to manage your rides. Reimbursement for transportation is available through [facility]! For the portal, please go to [email or web address]  Call the [facility] Crisis Line at [phone], at any time, 24 hours a day, 7 days a week. If you are in danger or need immediate medical attention call 911.  If you would like help to quit smoking, call 1-800-QUIT-NOW (3231806142) OR Espaol: 1-855-Djelo-Ya (4-132-440-1027) o para ms informacin haga clic aqu or Text READY to 253-664 to register via text  Zoe Hunter - following are the goals we discussed in your visit today:   Timeframe:  Short-Term Goal Priority:  High Start Date:   08/18/22                          Expected End Date:  ongoing                     Follow Up Date 09/13/22    - schedule appointment for vaccines needed due to my age or health - schedule recommended health tests (blood work, mammogram, colonoscopy, pap test) - schedule and keep appointment for annual check-up / OB appts   Why is this  important?   Screening tests can find diseases early when they are easier to treat.  Your doctor or nurse will talk with you about which tests are important for you.  Getting shots for common diseases like the flu and shingles will help prevent them.   Patient verbalizes understanding of instructions and care plan provided today and agrees to view in MyChart. Active MyChart status and patient understanding of how to access instructions and care plan via MyChart confirmed with patient.     The Managed Medicaid care management team will reach out to the patient again over the next 30 business  days.  The  Patient  has been provided with contact information for the Managed Medicaid care management team and has been advised to call with any health related questions or concerns.   Kathi Der RN, BSN [place]  [facility] Care Management Coordinator - Managed Medicaid High Risk [phone]    Following is a copy of your plan of care:  Care Plan : RN Care Manager Plan of Care  Updates made by [name], RN since 08/18/2022 12:00 AM     Problem: Health Promotion or Disease Self-Management (General Plan of Care)      Goal: OB Case Management   Start Date: 08/18/2022  Expected End Date: 11/18/2022  Priority: High  Note:   Current Barriers:  Knowledge  Deficits related to plan of care for management of GERD, ADHD, BP, anxiety  depression, adjustment disorder, 23.[redacted] weeks gestation with nausea and vomiting, hip and pelvic pain Chronic Disease Management support and education needs related to GERD, ADHD, BP, anxiety depression, adjustment disorder, 23.[redacted] weeks gestation with nausea and vomiting, hip and pelvic pain 08/18/22:  patient with ongoing nausea and vomiting, although improved-takes Zofran as needed.  H/O pre-eclampsia-no issues right now.  Patient was attending PT twice a week for hip and pelvic pain, but unable to attend right now due to child care issues-has 3yo-is  doing home exercises.  Patient wishes to express concern about patient experience she had at [facility].  Patient states she called and has received no return phone call from the Office of Patient Experience.  Patient continues services with Psychiatrist and Therapist on regular basis.    RNCM Clinical Goal(s):  Patient will verbalize understanding of plan for management of GERD, ADHD, BP,  anxiety, depression, adjustment disorder, 23.[redacted] weeks gestation with nausea and vomiting, hip and pelvic pain  as evidenced by patient report verbalize basic understanding of GERD, ADHD, BP,  anxiety, depression, adjustment disorder, 23.[redacted] weeks gestation with nausea and vomiting, hip and pelvic pain  disease process and self health management plan as evidenced by patient report take all medications exactly as prescribed and will call provider for medication related questions as evidenced by patient report demonstrate understanding of rationale for each prescribed medication as evidenced by patient report attend all scheduled medical appointments  as evidenced by patient report and EMR review demonstrate ongoing  adherence to prescribed treatment plan for GERD, ADHD, BP,  anxiety, depression, adjustment disorder, 23.[redacted] weeks gestation with nausea and vomiting, hip and pelvic pain  as evidenced by patient report and EMR review. continue to work with RN Care Manager to address care management and care coordination needs related to  GERD, ADHD, BP,  anxiety, depression, adjustment disorder, 23.[redacted] weeks gestation with nausea and vomiting, hip and pelvic pain as evidenced by adherence to CM Team Scheduled appointments experience decrease in ED visits as evidenced by EMR review.  ED visits in in last 6 months = 5 through collaboration with RN Care manager, provider, and care team.   Interventions: Inter-disciplinary care team collaboration (see longitudinal plan of care) Evaluation of current treatment plan related to  self  management and patient's adherence to plan as established by provider Patient provided with Office of Patient Experience contact information-email. Patient encouraged to continue home PT exercises.    Nausea and Vomiting:  (Status:  New goal.)  Short Term Goal Recommended adequate rest    , Advised to follow routine screening guidelines as outlined by obstetrics provider   , and Reviewed prescribed diet and encouraged optimizing nutrition during pregnancy       Patient Goals/Self-Care Activities: Take all medications as prescribed Attend all scheduled provider appointments Call pharmacy for medication refills 3-7 days in advance of running out of medications Perform all self care activities independently  Perform IADL's (shopping, preparing meals, housekeeping, managing finances) independently Call provider office for new concerns or questions   Follow Up Plan:  The patient has been provided with contact information for the care management team and has been advised to call with any health related questions or concerns.  The care management team will reach out to the patient again over the next 30 business  days.

## 2022-08-21 DIAGNOSIS — F411 Generalized anxiety disorder: Secondary | ICD-10-CM | POA: Diagnosis not present

## 2022-08-21 DIAGNOSIS — F331 Major depressive disorder, recurrent, moderate: Secondary | ICD-10-CM | POA: Diagnosis not present

## 2022-08-28 DIAGNOSIS — F902 Attention-deficit hyperactivity disorder, combined type: Secondary | ICD-10-CM | POA: Diagnosis not present

## 2022-08-28 DIAGNOSIS — F331 Major depressive disorder, recurrent, moderate: Secondary | ICD-10-CM | POA: Diagnosis not present

## 2022-08-28 DIAGNOSIS — F411 Generalized anxiety disorder: Secondary | ICD-10-CM | POA: Diagnosis not present

## 2022-08-28 DIAGNOSIS — Z419 Encounter for procedure for purposes other than remedying health state, unspecified: Secondary | ICD-10-CM | POA: Diagnosis not present

## 2022-09-02 LAB — PANORAMA PRENATAL TEST FULL PANEL:PANORAMA TEST PLUS 5 ADDITIONAL MICRODELETIONS: FETAL FRACTION: 11.4

## 2022-09-03 DIAGNOSIS — O99012 Anemia complicating pregnancy, second trimester: Secondary | ICD-10-CM | POA: Diagnosis not present

## 2022-09-03 DIAGNOSIS — O26892 Other specified pregnancy related conditions, second trimester: Secondary | ICD-10-CM | POA: Diagnosis not present

## 2022-09-03 DIAGNOSIS — Z3490 Encounter for supervision of normal pregnancy, unspecified, unspecified trimester: Secondary | ICD-10-CM | POA: Diagnosis not present

## 2022-09-03 DIAGNOSIS — O99322 Drug use complicating pregnancy, second trimester: Secondary | ICD-10-CM | POA: Diagnosis not present

## 2022-09-03 DIAGNOSIS — F1290 Cannabis use, unspecified, uncomplicated: Secondary | ICD-10-CM | POA: Diagnosis not present

## 2022-09-03 DIAGNOSIS — O99013 Anemia complicating pregnancy, third trimester: Secondary | ICD-10-CM | POA: Insufficient documentation

## 2022-09-03 DIAGNOSIS — O26893 Other specified pregnancy related conditions, third trimester: Secondary | ICD-10-CM | POA: Insufficient documentation

## 2022-09-03 DIAGNOSIS — O99320 Drug use complicating pregnancy, unspecified trimester: Secondary | ICD-10-CM | POA: Insufficient documentation

## 2022-09-03 DIAGNOSIS — Z3A25 25 weeks gestation of pregnancy: Secondary | ICD-10-CM | POA: Diagnosis not present

## 2022-09-03 DIAGNOSIS — R109 Unspecified abdominal pain: Secondary | ICD-10-CM | POA: Diagnosis not present

## 2022-09-04 DIAGNOSIS — Z3490 Encounter for supervision of normal pregnancy, unspecified, unspecified trimester: Secondary | ICD-10-CM | POA: Diagnosis not present

## 2022-09-04 DIAGNOSIS — O99322 Drug use complicating pregnancy, second trimester: Secondary | ICD-10-CM | POA: Diagnosis not present

## 2022-09-04 DIAGNOSIS — O26892 Other specified pregnancy related conditions, second trimester: Secondary | ICD-10-CM | POA: Diagnosis not present

## 2022-09-04 DIAGNOSIS — O99012 Anemia complicating pregnancy, second trimester: Secondary | ICD-10-CM | POA: Diagnosis not present

## 2022-09-04 DIAGNOSIS — R109 Unspecified abdominal pain: Secondary | ICD-10-CM | POA: Diagnosis not present

## 2022-09-04 DIAGNOSIS — F1290 Cannabis use, unspecified, uncomplicated: Secondary | ICD-10-CM | POA: Diagnosis not present

## 2022-09-04 DIAGNOSIS — Z3A25 25 weeks gestation of pregnancy: Secondary | ICD-10-CM | POA: Diagnosis not present

## 2022-09-08 DIAGNOSIS — F331 Major depressive disorder, recurrent, moderate: Secondary | ICD-10-CM | POA: Diagnosis not present

## 2022-09-08 DIAGNOSIS — F902 Attention-deficit hyperactivity disorder, combined type: Secondary | ICD-10-CM | POA: Diagnosis not present

## 2022-09-08 DIAGNOSIS — F411 Generalized anxiety disorder: Secondary | ICD-10-CM | POA: Diagnosis not present

## 2022-09-11 ENCOUNTER — Other Ambulatory Visit: Payer: Medicaid Other | Admitting: Obstetrics and Gynecology

## 2022-09-11 DIAGNOSIS — Z3A26 26 weeks gestation of pregnancy: Secondary | ICD-10-CM | POA: Diagnosis not present

## 2022-09-11 DIAGNOSIS — O35EXX0 Maternal care for other (suspected) fetal abnormality and damage, fetal genitourinary anomalies, not applicable or unspecified: Secondary | ICD-10-CM | POA: Diagnosis not present

## 2022-09-11 DIAGNOSIS — F331 Major depressive disorder, recurrent, moderate: Secondary | ICD-10-CM | POA: Diagnosis not present

## 2022-09-11 DIAGNOSIS — Z3689 Encounter for other specified antenatal screening: Secondary | ICD-10-CM | POA: Diagnosis not present

## 2022-09-11 DIAGNOSIS — F902 Attention-deficit hyperactivity disorder, combined type: Secondary | ICD-10-CM | POA: Diagnosis not present

## 2022-09-11 DIAGNOSIS — F411 Generalized anxiety disorder: Secondary | ICD-10-CM | POA: Diagnosis not present

## 2022-09-11 NOTE — Patient Instructions (Signed)
Hi Zoe Hunter, I am sorry I missed you today, I hope you are doing okay- as a part of your Medicaid benefit, you are eligible for care management and care coordination services at no cost or copay. I was unable to reach you by phone today but would be happy to help you with your health related needs. Please feel free to call me at [phone].  A member of the Managed Medicaid care management team will reach out to you again over the next 30 business days.   Kathi Der RN, BSN [place]  [employer] - Managed Medicaid High Risk [phone]

## 2022-09-11 NOTE — Patient Outreach (Signed)
  Medicaid Managed Care   Unsuccessful Attempt Note   09/11/2022 Name: Zoe Hunter MRN: [id number] DOB: 04/23/1995  Referred by: [name], MD Reason for referral : High Risk Managed Medicaid (Unsuccessful telephone outreach)  An unsuccessful telephone outreach was attempted today. The patient was referred to the case management team for assistance with care management and care coordination.    Follow Up Plan: The Managed Medicaid care management team will reach out to the patient again over the next 30 business  days. and The  Patient has been provided with contact information for the Managed Medicaid care management team and has been advised to call with any health related questions or concerns.   Kathi Der RN, BSN [place]  [employer] - Managed Medicaid High Risk [phone]

## 2022-09-13 DIAGNOSIS — R070 Pain in throat: Secondary | ICD-10-CM | POA: Diagnosis not present

## 2022-09-13 DIAGNOSIS — Z20822 Contact with and (suspected) exposure to covid-19: Secondary | ICD-10-CM | POA: Diagnosis not present

## 2022-09-17 DIAGNOSIS — J0140 Acute pansinusitis, unspecified: Secondary | ICD-10-CM | POA: Diagnosis not present

## 2022-09-17 DIAGNOSIS — H1032 Unspecified acute conjunctivitis, left eye: Secondary | ICD-10-CM | POA: Diagnosis not present

## 2022-09-17 DIAGNOSIS — Z20822 Contact with and (suspected) exposure to covid-19: Secondary | ICD-10-CM | POA: Diagnosis not present

## 2022-09-17 DIAGNOSIS — M791 Myalgia, unspecified site: Secondary | ICD-10-CM | POA: Diagnosis not present

## 2022-09-18 DIAGNOSIS — H60501 Unspecified acute noninfective otitis externa, right ear: Secondary | ICD-10-CM | POA: Diagnosis not present

## 2022-09-26 ENCOUNTER — Inpatient Hospital Stay
Admission: EM | Admit: 2022-09-26 | Discharge: 2022-09-27 | DRG: 832 | Disposition: A | Discharge status: Home / Self Care | Payer: Medicaid Other | Attending: Obstetrics | Admitting: Obstetrics

## 2022-09-26 ENCOUNTER — Encounter: Payer: Self-pay | Admitting: Obstetrics and Gynecology

## 2022-09-26 ENCOUNTER — Other Ambulatory Visit: Payer: Self-pay

## 2022-09-26 DIAGNOSIS — O212 Late vomiting of pregnancy: Secondary | ICD-10-CM | POA: Diagnosis not present

## 2022-09-26 DIAGNOSIS — F1290 Cannabis use, unspecified, uncomplicated: Secondary | ICD-10-CM | POA: Diagnosis not present

## 2022-09-26 DIAGNOSIS — R1012 Left upper quadrant pain: Secondary | ICD-10-CM | POA: Diagnosis not present

## 2022-09-26 DIAGNOSIS — F419 Anxiety disorder, unspecified: Secondary | ICD-10-CM | POA: Diagnosis not present

## 2022-09-26 DIAGNOSIS — O219 Vomiting of pregnancy, unspecified: Principal | ICD-10-CM | POA: Diagnosis present

## 2022-09-26 DIAGNOSIS — R112 Nausea with vomiting, unspecified: Secondary | ICD-10-CM | POA: Diagnosis not present

## 2022-09-26 DIAGNOSIS — O99213 Obesity complicating pregnancy, third trimester: Secondary | ICD-10-CM | POA: Diagnosis present

## 2022-09-26 DIAGNOSIS — Z8249 Family history of ischemic heart disease and other diseases of the circulatory system: Secondary | ICD-10-CM

## 2022-09-26 DIAGNOSIS — D509 Iron deficiency anemia, unspecified: Secondary | ICD-10-CM | POA: Diagnosis present

## 2022-09-26 DIAGNOSIS — O99323 Drug use complicating pregnancy, third trimester: Secondary | ICD-10-CM | POA: Diagnosis not present

## 2022-09-26 DIAGNOSIS — Z3A29 29 weeks gestation of pregnancy: Secondary | ICD-10-CM

## 2022-09-26 DIAGNOSIS — O99013 Anemia complicating pregnancy, third trimester: Secondary | ICD-10-CM | POA: Diagnosis not present

## 2022-09-26 DIAGNOSIS — O210 Mild hyperemesis gravidarum: Secondary | ICD-10-CM | POA: Diagnosis present

## 2022-09-26 DIAGNOSIS — O211 Hyperemesis gravidarum with metabolic disturbance: Secondary | ICD-10-CM | POA: Diagnosis not present

## 2022-09-26 LAB — COMPREHENSIVE METABOLIC PANEL
ALT: 17 U/L (ref 0–44)
AST: 18 U/L (ref 15–41)
Albumin: 3 g/dL — ABNORMAL LOW (ref 3.5–5.0)
Alkaline Phosphatase: 101 U/L (ref 38–126)
Anion gap: 10 (ref 5–15)
BUN: <5 mg/dL — ABNORMAL LOW (ref 6–20)
CO2: 22 mmol/L (ref 22–32)
Calcium: 8.4 mg/dL — ABNORMAL LOW (ref 8.9–10.3)
Chloride: 102 mmol/L (ref 98–111)
Creatinine, Ser: 0.46 mg/dL (ref 0.44–1.00)
GFR, Estimated: >60 mL/min (ref >60)
Glucose, Bld: 105 mg/dL — ABNORMAL HIGH (ref 70–99)
Potassium: 3.1 mmol/L — ABNORMAL LOW (ref 3.5–5.1)
Sodium: 134 mmol/L — ABNORMAL LOW (ref 135–145)
Total Bilirubin: 0.3 mg/dL (ref 0.3–1.2)
Total Protein: 7.1 g/dL (ref 6.5–8.1)

## 2022-09-26 LAB — MAGNESIUM: Magnesium: 1.8 mg/dL (ref 1.7–2.4)

## 2022-09-26 LAB — CBC
HCT: 27.3 % — ABNORMAL LOW (ref 36.0–46.0)
Hemoglobin: 8.6 g/dL — ABNORMAL LOW (ref 12.0–15.0)
MCH: 23.8 pg — ABNORMAL LOW (ref 26.0–34.0)
MCHC: 31.5 g/dL (ref 30.0–36.0)
MCV: 75.6 fL — ABNORMAL LOW (ref 80.0–100.0)
Platelets: 353 10*3/uL (ref 150–400)
RBC: 3.61 MIL/uL — ABNORMAL LOW (ref 3.87–5.11)
RDW: 14.1 % (ref 11.5–15.5)
WBC: 12 10*3/uL — ABNORMAL HIGH (ref 4.0–10.5)
nRBC: 0 % (ref 0.0–0.2)

## 2022-09-26 LAB — LIPASE, BLOOD: Lipase: 31 U/L (ref 11–51)

## 2022-09-26 LAB — AMYLASE: Amylase: 82 U/L (ref 28–100)

## 2022-09-26 MED ORDER — POTASSIUM CHLORIDE IN NACL 40-0.9 MEQ/L-% IV SOLN
INTRAVENOUS | Status: DC
  Filled 2022-09-26 (×5): qty 1000

## 2022-09-26 MED ORDER — METOCLOPRAMIDE HCL 10 MG PO TABS
10.0000 mg | ORAL_TABLET | Freq: Four times a day (QID) | ORAL | Status: DC
  Administered 2022-09-27 (×2): 10 mg via ORAL
  Filled 2022-09-26 (×4): qty 1

## 2022-09-26 MED ORDER — HYDROXYZINE HCL 25 MG PO TABS
50.0000 mg | ORAL_TABLET | Freq: Four times a day (QID) | ORAL | Status: DC | PRN
  Administered 2022-09-27: 50 mg via ORAL
  Filled 2022-09-26: qty 2

## 2022-09-26 MED ORDER — SODIUM CHLORIDE 0.9 % IV SOLN
25.0000 mg | Freq: Four times a day (QID) | INTRAVENOUS | Status: DC
  Administered 2022-09-26 – 2022-09-27 (×3): 25 mg via INTRAVENOUS
  Filled 2022-09-26 (×6): qty 1

## 2022-09-26 MED ORDER — FOLIC ACID 5 MG/ML IJ SOLN
1.0000 mg | Freq: Every day | INTRAMUSCULAR | Status: DC
  Administered 2022-09-27: 1 mg via INTRAVENOUS
  Filled 2022-09-26: qty 0.2

## 2022-09-26 MED ORDER — SODIUM CHLORIDE 0.9 % IV SOLN: 8.0000 mg | Freq: Three times a day (TID) | INTRAVENOUS | Status: DC | PRN

## 2022-09-26 MED ORDER — FAMOTIDINE IN NACL 20-0.9 MG/50ML-% IV SOLN
20.0000 mg | Freq: Two times a day (BID) | INTRAVENOUS | Status: DC
  Administered 2022-09-27: 20 mg via INTRAVENOUS
  Filled 2022-09-26 (×3): qty 50

## 2022-09-26 MED ORDER — DICYCLOMINE HCL 10 MG/5ML PO SOLN: 10.0000 mg | Freq: Once | ORAL | Status: DC | PRN

## 2022-09-26 MED ORDER — ONDANSETRON 4 MG PO TBDP
4.0000 mg | ORAL_TABLET | Freq: Three times a day (TID) | ORAL | Status: DC | PRN
  Administered 2022-09-27: 4 mg via ORAL
  Filled 2022-09-26: qty 1

## 2022-09-26 MED ORDER — SODIUM CHLORIDE 0.9 % IV BOLUS: 500.0000 mL | Freq: Once | INTRAVENOUS | Status: DC | PRN

## 2022-09-26 MED ORDER — HYDROXYZINE HCL 50 MG/ML IM SOLN: 50.0000 mg | Freq: Four times a day (QID) | INTRAMUSCULAR | Status: DC | PRN

## 2022-09-26 MED ORDER — METOCLOPRAMIDE HCL 5 MG/ML IJ SOLN
10.0000 mg | Freq: Four times a day (QID) | INTRAMUSCULAR | Status: DC
  Administered 2022-09-26 – 2022-09-27 (×3): 10 mg via INTRAVENOUS
  Filled 2022-09-26 (×4): qty 2

## 2022-09-26 MED ORDER — LACTATED RINGERS IV BOLUS
1000.0000 mL | Freq: Once | INTRAVENOUS | Status: AC
  Administered 2022-09-26: 1000 mL via INTRAVENOUS

## 2022-09-26 MED ORDER — ALUM & MAG HYDROXIDE-SIMETH 200-200-20 MG/5ML PO SUSP: 30.0000 mL | Freq: Once | ORAL | Status: DC | PRN

## 2022-09-26 MED ORDER — VITAMIN B-1 100 MG PO TABS
100.0000 mg | ORAL_TABLET | Freq: Every day | ORAL | Status: DC
  Filled 2022-09-26: qty 1

## 2022-09-26 MED ORDER — PROMETHAZINE HCL 25 MG RE SUPP: 12.5000 mg | RECTAL | Status: DC | PRN

## 2022-09-26 MED ORDER — THIAMINE HCL 100 MG/ML IJ SOLN
100.0000 mg | Freq: Every day | INTRAMUSCULAR | Status: DC
  Administered 2022-09-27: 100 mg via INTRAVENOUS
  Filled 2022-09-26 (×2): qty 1

## 2022-09-26 MED ORDER — ONDANSETRON HCL 4 MG/2ML IJ SOLN
4.0000 mg | Freq: Four times a day (QID) | INTRAMUSCULAR | Status: DC | PRN
  Administered 2022-09-26: 4 mg via INTRAVENOUS
  Filled 2022-09-26: qty 2

## 2022-09-26 MED ORDER — FAMOTIDINE IN NACL 20-0.9 MG/50ML-% IV SOLN
20.0000 mg | Freq: Once | INTRAVENOUS | Status: AC
  Administered 2022-09-26: 20 mg via INTRAVENOUS
  Filled 2022-09-26: qty 50

## 2022-09-26 MED ORDER — FOLIC ACID 1 MG PO TABS
1.0000 mg | ORAL_TABLET | Freq: Every day | ORAL | Status: DC
  Filled 2022-09-26: qty 1

## 2022-09-26 MED ORDER — THIAMINE HCL 100 MG/ML IJ SOLN
Freq: Once | INTRAVENOUS | Status: AC
  Filled 2022-09-26: qty 1000

## 2022-09-26 MED ORDER — ACETAMINOPHEN 500 MG PO TABS: 1000.0000 mg | ORAL_TABLET | Freq: Four times a day (QID) | ORAL | Status: DC | PRN

## 2022-09-26 MED ORDER — IRON SUCROSE 500 MG IVPB - SIMPLE MED
500.0000 mg | Freq: Once | INTRAVENOUS | Status: AC
  Administered 2022-09-27: 500 mg via INTRAVENOUS
  Filled 2022-09-26: qty 25

## 2022-09-26 MED ORDER — FAMOTIDINE 20 MG PO TABS
20.0000 mg | ORAL_TABLET | Freq: Two times a day (BID) | ORAL | Status: DC
  Administered 2022-09-27: 20 mg via ORAL
  Filled 2022-09-26: qty 1

## 2022-09-26 MED ORDER — CALCIUM CARBONATE ANTACID 500 MG PO CHEW: 2.0000 | CHEWABLE_TABLET | ORAL | Status: DC | PRN

## 2022-09-26 MED ORDER — PROMETHAZINE HCL 25 MG PO TABS: 12.5000 mg | ORAL_TABLET | ORAL | Status: DC | PRN

## 2022-09-26 MED ORDER — MORPHINE SULFATE (PF) 2 MG/ML IV SOLN
2.0000 mg | INTRAVENOUS | Status: DC | PRN
  Administered 2022-09-26 – 2022-09-27 (×5): 2 mg via INTRAVENOUS
  Filled 2022-09-26 (×5): qty 1

## 2022-09-26 MED ORDER — LACTATED RINGERS IV SOLN: 125.0000 mL/h | INTRAVENOUS | Status: DC

## 2022-09-26 MED ORDER — ONDANSETRON HCL 4 MG/2ML IJ SOLN
4.0000 mg | Freq: Three times a day (TID) | INTRAMUSCULAR | Status: DC | PRN
  Administered 2022-09-27: 4 mg via INTRAVENOUS
  Filled 2022-09-26: qty 2

## 2022-09-26 MED ORDER — DIPHENHYDRAMINE HCL 50 MG/ML IJ SOLN: 25.0000 mg | Freq: Once | INTRAMUSCULAR | Status: DC | PRN

## 2022-09-26 MED ORDER — SODIUM CHLORIDE 0.9 % IV SOLN: INTRAVENOUS | Status: DC | PRN

## 2022-09-26 NOTE — OB Triage Note (Addendum)
Pt Zoe Hunter 27 y.o. presents to labor and delivery triage reporting with upper abdominal pain 9/10 and actively vomiting. This pain began this morning around 9:30am today. She attempted to eat apple sauce around 10-11am. Pt has a history of acute gastritis. Pt states minor pelvic pain. Pt is a [id number] at [redacted]w[redacted]d . Pt denies signs and symptoms consistent with rupture of membranes or active vaginal bleeding. Pt denies contractions and states positive fetal movement. Pt declines EFM at this time due to pain and vomiting. Pt is requesting pain and antiemetic medication.  Vital signs obtained and within normal limits. Provider notified of pt.

## 2022-09-26 NOTE — H&P (Incomplete)
HISTORY AND PHYSICAL NOTE  History of Present Illness: Zoe Hunter is a 27 y.o. G2P0101 at [redacted]w[redacted]d admitted for intractable nausea and vomiting in pregnancy.  She presented to L&D with complaints of upper abdominal pain, N/V since this morning.  She tried taking zofran this morning but was unable to keep it down.  She reports LUQ abdominal pain that is worse right before she has an emesis episode.  She's had this pain intermittently throughout the pregnancy but it's most significant during N/V episodes.  Zoe Hunter has been evaluated multiple times in pregnancy in several ED's for N/V and LUQ pain.  She previously had an [place] and abd MRI that was overall reassuring.  Her primary OB provider had concern for peptic ulcer disease in May and started her on scheduled pepcid.   She denies cramping, vaginal bleeding, or LOF.  Denies decreased fetal movement.   Factors complicating pregnancy:  Marijuana use in pregnancy  Hyperemesis gravidarum Anemia during pregnancy  Bipolar d/o/ADD/Anxiety  Obesity in pregnancy   Prenatal care site:  [facility]   Patient Active Problem List   Diagnosis Date Noted   Nausea and vomiting during pregnancy 09/26/2022   Abdominal pain during pregnancy in second trimester 09/03/2022   Anemia during pregnancy in third trimester 09/03/2022   Marijuana use during pregnancy 09/03/2022   Anxiety during pregnancy in second trimester, antepartum 06/29/2022   Hyperemesis gravidarum 05/28/2022   GERD (gastroesophageal reflux disease) 06/02/2021   Seizure-like activity (HCC) 08/17/2020   Anxiety 11/14/2018   Cannabis use disorder 11/14/2018   Adjustment disorder with depressed mood 11/11/2018   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   LGSIL on Pap smear of cervix 05/24/2017   ADD (attention deficit disorder) 02/19/2015   Bipolar affective disorder (HCC) 02/19/2015   Clinical depression 02/19/2015   Cholelithiasis 01/28/2015    Past Medical History:   Diagnosis Date   ADHD    ADHD (attention deficit hyperactivity disorder)    Anxiety    Bipolar disorder (HCC)    Cholelithiasis 01/2015   Depression    GERD (gastroesophageal reflux disease)    Suicidal ideation 11/14/2018   Transient loss of consciousness 08/17/2020    Past Surgical History:  Procedure Laterality Date   CHOLECYSTECTOMY N/A 02/16/2015   Procedure: LAPAROSCOPIC CHOLECYSTECTOMY;  Surgeon: Natale Lay, MD;  Location: [facility] ORS;  Service: General;  Laterality: N/A;   ESOPHAGOGASTRODUODENOSCOPY (EGD) WITH PROPOFOL N/A 05/05/2016   Procedure: ESOPHAGOGASTRODUODENOSCOPY (EGD) WITH PROPOFOL;  Surgeon: Christena Deem, MD;  Location: [facility] ENDOSCOPY;  Service: Endoscopy;  Laterality: N/A;   TONSILLECTOMY     WISDOM TOOTH EXTRACTION      OB History  Gravida Para Term Preterm AB Living  2 1   1   1   SAB IAB Ectopic Multiple Live Births          1    # Outcome Date GA Lbr Len/2nd Weight Sex Type Anes PTL Lv  2 Current           1 Preterm 02/2019 [redacted]w[redacted]d    Vag-Spont  N LIV     Complications: Preeclampsia, severe, third trimester    Social History:  reports that she has never smoked. She has never used smokeless tobacco. She reports current drug use. Drug: Marijuana. She reports that she does not drink alcohol.  Family History: family history includes Cancer in her mother and another family member; Heart disease in her maternal grandfather; Hypertension in her maternal grandfather, maternal grandmother,  and mother.  No Known Allergies  Medications Prior to Admission  Medication Sig Dispense Refill Last Dose   famotidine (PEPCID) 40 MG/5ML suspension Take 10 mg by mouth daily.      metoCLOPramide (REGLAN) 5 MG/5ML solution Take 5 mg by mouth 4 (four) times daily -  before meals and at bedtime.      aspirin EC 81 MG tablet Take 1 tablet (81 mg total) by mouth daily. Swallow whole. (Patient taking differently: Take 81 mg by mouth daily. Swallow whole.Doesn't take everyday) 30  tablet 12    ondansetron (ZOFRAN-ODT) 4 MG disintegrating tablet 4mg  ODT q4 hours prn nausea/vomit 6 tablet 0     Review of Systems  Constitutional:  Positive for malaise/fatigue. Negative for chills and fever.  Respiratory:  Negative for cough, shortness of breath and wheezing.   Cardiovascular:  Negative for chest pain and leg swelling.  Gastrointestinal:  Positive for abdominal pain, nausea and vomiting. Negative for constipation and diarrhea.  Genitourinary:  Negative for dysuria, frequency and urgency.  Musculoskeletal:  Negative for back pain.  Neurological:  Negative for dizziness and headaches.  Psychiatric/Behavioral:  Negative for depression.     Physical Examination: Vitals:  BP 106/61 (BP Location: Left Arm)   Pulse 69   Temp 98.8 F (37.1 C) (Oral)   Resp 18   Ht 5\' 3"  (1.6 m)   Wt 111.7 kg   LMP 03/07/2022 (Approximate)   SpO2 99%   BMI 43.61 kg/m  Physical Exam Constitutional:      Appearance: She is obese. She is ill-appearing.  Cardiovascular:     Pulses: Normal pulses.  Pulmonary:     Effort: Pulmonary effort is normal.  Abdominal:     General: Bowel sounds are normal.     Palpations: Abdomen is soft. There is no mass.     Tenderness: There is abdominal tenderness (diffuse tenderness over upper abdomen, unable to localize pain) in the left upper quadrant. There is no right CVA tenderness, left CVA tenderness or rebound. Negative signs include Murphy's sign and McBurney's sign.     Comments: gravid  Genitourinary:    General: Normal vulva.  Musculoskeletal:        General: Normal range of motion.     Cervical back: Normal range of motion.  Skin:    General: Skin is warm and dry.     Capillary Refill: Capillary refill takes less than 2 seconds.  Neurological:     Mental Status: She is oriented to person, place, and time.  Psychiatric:        Mood and Affect: Mood normal.    NST: Baseline FHR: 120 beats/min Variability: moderate Accelerations:  present Decelerations: absent Tocometry: None  Interpretation: Category I, appropriate for gestational age  INDICATIONS: rule out uterine contractions RESULTS:  A NST procedure was performed with FHR monitoring and a normal baseline established, appropriate time of 20-40 minutes of evaluation, and accels >2 seen w 15x15 characteristics.  Results show a REACTIVE NST.   Labs:  Results for orders placed or performed during the hospital encounter of 09/26/22 (from the past 24 hour(s))  CBC   Collection Time: 09/26/22  6:27 PM  Result Value Ref Range   WBC 12.0 (H) 4.0 - 10.5 K/uL   RBC 3.61 (L) 3.87 - 5.11 MIL/uL   Hemoglobin 8.6 (L) 12.0 - 15.0 g/dL   HCT 10.2 (L) 72.5 - 36.6 %   MCV 75.6 (L) 80.0 - 100.0 fL   MCH 23.8 (L) 26.0 -  34.0 pg   MCHC 31.5 30.0 - 36.0 g/dL   RDW 21.3 08.6 - 57.8 %   Platelets 353 150 - 400 K/uL   nRBC 0.0 0.0 - 0.2 %  Comprehensive metabolic panel   Collection Time: 09/26/22  6:27 PM  Result Value Ref Range   Sodium 134 (L) 135 - 145 mmol/L   Potassium 3.1 (L) 3.5 - 5.1 mmol/L   Chloride 102 98 - 111 mmol/L   CO2 22 22 - 32 mmol/L   Glucose, Bld 105 (H) 70 - 99 mg/dL   BUN <5 (L) 6 - 20 mg/dL   Creatinine, Ser 4.69 0.44 - 1.00 mg/dL   Calcium 8.4 (L) 8.9 - 10.3 mg/dL   Total Protein 7.1 6.5 - 8.1 g/dL   Albumin 3.0 (L) 3.5 - 5.0 g/dL   AST 18 15 - 41 U/L   ALT 17 0 - 44 U/L   Alkaline Phosphatase 101 38 - 126 U/L   Total Bilirubin 0.3 0.3 - 1.2 mg/dL   GFR, Estimated >62 >95 mL/min   Anion gap 10 5 - 15  Magnesium   Collection Time: 09/26/22  6:27 PM  Result Value Ref Range   Magnesium 1.8 1.7 - 2.4 mg/dL  Lipase, blood   Collection Time: 09/26/22  6:27 PM  Result Value Ref Range   Lipase 31 11 - 51 U/L  Amylase   Collection Time: 09/26/22  6:27 PM  Result Value Ref Range   Amylase 82 28 - 100 U/L   Imaging Studies: No results found.  Assessment and Plan: Patient Active Problem List   Diagnosis Date Noted   Nausea and vomiting  during pregnancy 09/26/2022   Abdominal pain during pregnancy in second trimester 09/03/2022   Anemia during pregnancy in third trimester 09/03/2022   Marijuana use during pregnancy 09/03/2022   Anxiety during pregnancy in second trimester, antepartum 06/29/2022   Hyperemesis gravidarum 05/28/2022   GERD (gastroesophageal reflux disease) 06/02/2021   Seizure-like activity (HCC) 08/17/2020   Anxiety 11/14/2018   Cannabis use disorder 11/14/2018   Adjustment disorder with depressed mood 11/11/2018   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 Admit to Antenatal - hyperemesis protocol initiated  - NPO   - Maintain IV fluids and IV electrolyte replacement  - Scheduled IV/PO antiemetics ordered with PRN IV/PO/PR antiemetics for breakthrough N/V  - Daily weight  - Q4 hour intake/output   2. Routine antenatal care  - Activity as tolerated  - Bathroom privileges  - FHT q shift  3. LUQ Pain  - Plan for GI consult in AM - Continue scheduled pepcid  - Pancreatitis not likely - amylase and lipase WNL   4. Anemia in pregnancy  - Maternal iron deficiency anemia in pregnancy - clinically significant  - Intervention: IV Venofer ordered  - Will start on PO ferrous sulfate when able to tolerate PO intake   Gustavo Lah, CNM  Certified Nurse Midwife [facility]

## 2022-09-26 NOTE — Progress Notes (Signed)
RN at bedside to D/C fetal monitoring, but maintain contraction monitoring. Pt requesting pain medication. RN offered tylenol (pain medication that is ordered at th is time). Pt declined tylenol.

## 2022-09-26 NOTE — Progress Notes (Signed)
RN at at bedside to start IV infusion, administer medications, and initiate EFM. RN started IV and medications. Pt requested "more time" before we started EFM. RN will return and assess.

## 2022-09-27 DIAGNOSIS — O211 Hyperemesis gravidarum with metabolic disturbance: Secondary | ICD-10-CM | POA: Diagnosis not present

## 2022-09-27 DIAGNOSIS — F1290 Cannabis use, unspecified, uncomplicated: Secondary | ICD-10-CM | POA: Diagnosis not present

## 2022-09-27 DIAGNOSIS — Z3A29 29 weeks gestation of pregnancy: Secondary | ICD-10-CM | POA: Diagnosis not present

## 2022-09-27 DIAGNOSIS — R112 Nausea with vomiting, unspecified: Secondary | ICD-10-CM

## 2022-09-27 DIAGNOSIS — R1012 Left upper quadrant pain: Secondary | ICD-10-CM | POA: Diagnosis not present

## 2022-09-27 DIAGNOSIS — O99013 Anemia complicating pregnancy, third trimester: Secondary | ICD-10-CM | POA: Diagnosis not present

## 2022-09-27 MED ORDER — SUCRALFATE 1 G PO TABS: 1.0000 g | ORAL_TABLET | Freq: Three times a day (TID) | ORAL | 2 refills | Status: DC

## 2022-09-27 MED ORDER — FERROUS SULFATE 325 (65 FE) MG PO TABS
325.0000 mg | ORAL_TABLET | Freq: Two times a day (BID) | ORAL | Status: DC
Start: 2022-09-27 — End: 2022-11-08

## 2022-09-27 MED ORDER — SUCRALFATE 1 G PO TABS
1.0000 g | ORAL_TABLET | Freq: Three times a day (TID) | ORAL | Status: DC
  Administered 2022-09-27 (×2): 1 g via ORAL
  Filled 2022-09-27 (×3): qty 1

## 2022-09-27 MED ORDER — ACETAMINOPHEN 500 MG PO TABS: 1000.0000 mg | ORAL_TABLET | Freq: Four times a day (QID) | ORAL | Status: AC | PRN

## 2022-09-27 NOTE — Consult Note (Signed)
[name] , MD [address], [address], [place], [place], [address] [address], [place], [place], [address] Phone: [phone]  Fax: [phone]  Consultation  Referring Provider:   Rolla Plate CNM Primary Care Physician:  [name], MD Primary Gastroenterologist:  [facility]    Reason for Consultation:     vomiting   Date of Admission:  09/26/2022 Date of Consultation:  09/27/2022         HPI:   Zoe Hunter is a 27 y.o. female is a patient well-known to [facility] who has a history of chronic marijuana use going back even in 2022 when she was evaluated for the same.  Has a history of documented cannabinoid hyperemesis syndrome and vomiting during pregnancy and off pregnancy.  History of prior cholecystectomy.  Also has documented delayed gastric emptying.  Has been treated with Reglan in the past 29 weeks into her pregnancy G2, P0 admitted for intractable nausea and vomiting.  Left upper quadrant pain which is what she has had in the past as well.  She is presently G2, P0 history of MRI of the abdomen in March 2024 for abdominal pain showed no abnormality Urine test is positive for cannabinoids on 09/03/2022 CMP performed yesterday shows a low potassium low creatinine of 0.46 albumin of 3.0 hemoglobin is low at 8.6 g with a low MCV of 75.6 I cannot see any iron studies.  No abdominal imaging at this point of time. Liver function tests are normal.  She states that the symptoms she presented with during this admission are exactly the same as she has had in the past.  Nausea and vomiting began yesterday clear vomitus.  She is presently n.p.o. not eating or drinking anything she feels like she can drink something.  Has had 2 episodes of vomiting since morning clear liquid greenish in color at times.  Denies use of any over-the-counter medications she says she last smoked marijuana about a month denies any passive smoking.  Denies any NSAID use.  No other complaints.  She previously  had left upper quadrant abdominal pain which has resolved.  Past Medical History:  Diagnosis Date   ADHD    ADHD (attention deficit hyperactivity disorder)    Anxiety    Bipolar disorder (HCC)    Cholelithiasis 01/2015   Depression    GERD (gastroesophageal reflux disease)    Suicidal ideation 11/14/2018   Transient loss of consciousness 08/17/2020    Past Surgical History:  Procedure Laterality Date   CHOLECYSTECTOMY N/A 02/16/2015   Procedure: LAPAROSCOPIC CHOLECYSTECTOMY;  Surgeon: Natale Lay, MD;  Location: [facility] ORS;  Service: General;  Laterality: N/A;   ESOPHAGOGASTRODUODENOSCOPY (EGD) WITH PROPOFOL N/A 05/05/2016   Procedure: ESOPHAGOGASTRODUODENOSCOPY (EGD) WITH PROPOFOL;  Surgeon: Christena Deem, MD;  Location: [facility] ENDOSCOPY;  Service: Endoscopy;  Laterality: N/A;   TONSILLECTOMY     WISDOM TOOTH EXTRACTION      Prior to Admission medications   Medication Sig Start Date End Date Taking? Authorizing Provider  famotidine (PEPCID) 40 MG/5ML suspension Take 10 mg by mouth daily.   Yes [provider]  metoCLOPramide (REGLAN) 5 MG/5ML solution Take 5 mg by mouth 4 (four) times daily -  before meals and at bedtime.   Yes [provider]  aspirin EC 81 MG tablet Take 1 tablet (81 mg total) by mouth daily. Swallow whole. Patient taking differently: Take 81 mg by mouth daily. Swallow whole.Doesn't take everyday 05/29/22   Chari Manning  Rolla Plate, CNM  ondansetron (ZOFRAN-ODT) 4 MG disintegrating tablet 4mg  ODT q4 hours prn nausea/vomit 06/09/22   [name], MD    Family History  Problem Relation Age of Onset   Hypertension Mother    Cancer Mother    Hypertension Maternal Grandmother    Cancer Other    Hypertension Maternal Grandfather    Heart disease Maternal Grandfather      Social History   Tobacco Use   Smoking status: Never   Smokeless tobacco: Never   Tobacco comments:    boyfriend smokes  Vaping Use   Vaping status: Every Day   Substance Use Topics   Alcohol use: No   Drug use: Yes    Types: Marijuana    Comment: quit - 30 days ago from 7/30    Allergies as of 09/26/2022   (No Known Allergies)    Review of Systems:    All systems reviewed and negative except where noted in HPI.   Physical Exam:  Vital signs in last 24 hours: Temp:  [97.7 F (36.5 C)-98.8 F (37.1 C)] 98.4 F (36.9 C) (07/31 0814) Pulse Rate:  [60-89] 77 (07/31 0814) Resp:  [18-22] 18 (07/31 0814) BP: (99-114)/(54-71) 99/71 (07/31 0814) SpO2:  [94 %-100 %] 94 % (07/31 0814) Weight:  [111.6 kg-111.7 kg] 111.7 kg (07/31 0500)   General:   Pleasant, cooperative in NAD appears comfortable in bed Head:  Normocephalic and atraumatic. Eyes:   No icterus.   Conjunctiva pink. PERRLA. Ears:  Normal auditory acuity. Neck:  Supple; no masses or thyroidomegaly Lungs: Respirations even and unlabored. Lungs clear to auscultation bilaterally.   No wheezes, crackles, or rhonchi.  Heart:  Regular rate and rhythm;  Without murmur, clicks, rubs or gallops Abdomen:  Soft, distended due to pregnancy nontender. Normal bowel sounds. No appreciable masses or hepatomegaly.  No rebound or guarding.  Neurologic:  Alert and oriented x3;  grossly normal neurologically. Skin:  Intact without significant lesions or rashes.  Tattoos over her chest Cervical Nodes:  No significant cervical adenopathy. Psych:  Alert and cooperative. Normal affect.  LAB RESULTS: Recent Labs    09/26/22 1827 09/27/22 0558  WBC 12.0* 9.2  HGB 8.6* 8.2*  HCT 27.3* 25.9*  PLT 353 335   BMET Recent Labs    09/26/22 1827 09/27/22 0558  NA 134* 132*  K 3.1* 3.2*  CL 102 104  CO2 22 21*  GLUCOSE 105* 99  BUN <5* <5*  CREATININE 0.46 0.39*  CALCIUM 8.4* 8.1*   LFT Recent Labs    09/27/22 0558  PROT 6.2*  ALBUMIN 2.5*  AST 12*  ALT 15  ALKPHOS 87  BILITOT 0.6   PT/INR No results for input(s): "LABPROT", "INR" in the last 72 hours.  STUDIES: No results  found.    Impression / Plan:   [name] is a 27 y.o. y/o female with a known history of cannabinoid hyperemesis syndrome going back at least 2 years.  Has been worked up and evaluated at [facility] in the past.  Admitted with nausea vomiting abdominal pain.  Prior episodes of abdominal pain have also been similar going back to over 2 years.  She does have delayed gastric emptying seen in 2022 likely secondary to cannabis use.  Presently has no abdominal pain  Plan 1.  Left upper quadrant pain likely related to use of cannabis delayed gastric emptying.  Suggest a gastroparesis diet with low fiber limit the use of narcotics avoid NSAIDs.  Antiemetics as per hyperemesis protocol, IV fluids.She should avoid use of marijuana under all circumstances in the future.  Sucralfate is safe in pregnancy poorly absorbed.  Commence 4 times a day.  If pain does not improve with sucralfate changed to PPI tomorrow.  She wishes to drink something no contraindications from the GI point of view, could start with clears.  2.  Consider left upper quadrant ultrasound to evaluate area of discomfort if it recurs presently has none  3.  There is no role for gastric occult blood test positive or negative test does not change management.  Upper endoscopy is to be considered only as a last option in pregnancy with a good indication that it would change management based on benefit risk ratio  4.  Keep head end of the bed elevated at 45 degrees  5.  Agree with IV iron for iron deficiency anemia.  If it is felt that the cause of the anemia is not related to pregnancy then would require further evaluation once she completes her pregnancy  Thank you for involving me in the care of this patient.      LOS: 1 day   [name], MD  09/27/2022, 10:23 AM

## 2022-09-27 NOTE — Discharge Summary (Signed)
Patient ID: Zoe Hunter MRN: [id number] DOB/AGE: 1995/08/09 27 y.o.  Admit date: 09/26/2022 Discharge date: 09/27/2022  Admission Diagnoses: N/V in pregnancy  Factors complicating pregnancy: Marijuana use in pregnancy  Hyperemesis gravidarum Anemia during pregnancy  Bipolar d/o/ADD/Anxiety  Obesity in pregnancy   Prenatal Procedures: NST, ultrasound, and MRI  Consults: GI  Significant Diagnostic Studies:  Results for orders placed or performed during the hospital encounter of 09/26/22 (from the past 168 hour(s))  CBC   Collection Time: 09/26/22  6:27 PM  Result Value Ref Range   WBC 12.0 (H) 4.0 - 10.5 K/uL   RBC 3.61 (L) 3.87 - 5.11 MIL/uL   Hemoglobin 8.6 (L) 12.0 - 15.0 g/dL   HCT 46.9 (L) 62.9 - 52.8 %   MCV 75.6 (L) 80.0 - 100.0 fL   MCH 23.8 (L) 26.0 - 34.0 pg   MCHC 31.5 30.0 - 36.0 g/dL   RDW 41.3 24.4 - 01.0 %   Platelets 353 150 - 400 K/uL   nRBC 0.0 0.0 - 0.2 %  Comprehensive metabolic panel   Collection Time: 09/26/22  6:27 PM  Result Value Ref Range   Sodium 134 (L) 135 - 145 mmol/L   Potassium 3.1 (L) 3.5 - 5.1 mmol/L   Chloride 102 98 - 111 mmol/L   CO2 22 22 - 32 mmol/L   Glucose, Bld 105 (H) 70 - 99 mg/dL   BUN <5 (L) 6 - 20 mg/dL   Creatinine, Ser 2.72 0.44 - 1.00 mg/dL   Calcium 8.4 (L) 8.9 - 10.3 mg/dL   Total Protein 7.1 6.5 - 8.1 g/dL   Albumin 3.0 (L) 3.5 - 5.0 g/dL   AST 18 15 - 41 U/L   ALT 17 0 - 44 U/L   Alkaline Phosphatase 101 38 - 126 U/L   Total Bilirubin 0.3 0.3 - 1.2 mg/dL   GFR, Estimated >53 >66 mL/min   Anion gap 10 5 - 15  Magnesium   Collection Time: 09/26/22  6:27 PM  Result Value Ref Range   Magnesium 1.8 1.7 - 2.4 mg/dL  Lipase, blood   Collection Time: 09/26/22  6:27 PM  Result Value Ref Range   Lipase 31 11 - 51 U/L  Amylase   Collection Time: 09/26/22  6:27 PM  Result Value Ref Range   Amylase 82 28 - 100 U/L  Urinalysis, Complete w Microscopic -Urine, Clean Catch   Collection Time: 09/27/22 12:08 AM  Result  Value Ref Range   Color, Urine YELLOW (A) YELLOW   APPearance HAZY (A) CLEAR   Specific Gravity, Urine 1.015 1.005 - 1.030   pH 7.0 5.0 - 8.0   Glucose, UA NEGATIVE NEGATIVE mg/dL   Hgb urine dipstick NEGATIVE NEGATIVE   Bilirubin Urine NEGATIVE NEGATIVE   Ketones, ur 80 (A) NEGATIVE mg/dL   Protein, ur NEGATIVE NEGATIVE mg/dL   Nitrite NEGATIVE NEGATIVE   Leukocytes,Ua TRACE (A) NEGATIVE   RBC / HPF 6-10 0 - 5 RBC/hpf   WBC, UA 6-10 0 - 5 WBC/hpf   Bacteria, UA NONE SEEN NONE SEEN   Squamous Epithelial / HPF 0-5 0 - 5 /HPF   Mucus PRESENT    Amorphous Crystal PRESENT   Urine Drug Screen, Qualitative ([facility] only)   Collection Time: 09/27/22 12:08 AM  Result Value Ref Range   Tricyclic, Ur Screen NONE DETECTED NONE DETECTED   Amphetamines, Ur Screen NONE DETECTED NONE DETECTED   MDMA (Ecstasy)Ur Screen NONE DETECTED NONE DETECTED   Cocaine Metabolite,Ur [place]  NONE DETECTED NONE DETECTED   Opiate, Ur Screen POSITIVE (A) NONE DETECTED   Phencyclidine (PCP) Ur S NONE DETECTED NONE DETECTED   Cannabinoid 50 Ng, Ur [place] POSITIVE (A) NONE DETECTED   Barbiturates, Ur Screen NONE DETECTED NONE DETECTED   Benzodiazepine, Ur Scrn NONE DETECTED NONE DETECTED   Methadone Scn, Ur NONE DETECTED NONE DETECTED  CBC   Collection Time: 09/27/22  5:58 AM  Result Value Ref Range   WBC 9.2 4.0 - 10.5 K/uL   RBC 3.46 (L) 3.87 - 5.11 MIL/uL   Hemoglobin 8.2 (L) 12.0 - 15.0 g/dL   HCT 40.9 (L) 81.1 - 91.4 %   MCV 74.9 (L) 80.0 - 100.0 fL   MCH 23.7 (L) 26.0 - 34.0 pg   MCHC 31.7 30.0 - 36.0 g/dL   RDW 78.2 95.6 - 21.3 %   Platelets 335 150 - 400 K/uL   nRBC 0.0 0.0 - 0.2 %  Comprehensive metabolic panel   Collection Time: 09/27/22  5:58 AM  Result Value Ref Range   Sodium 132 (L) 135 - 145 mmol/L   Potassium 3.2 (L) 3.5 - 5.1 mmol/L   Chloride 104 98 - 111 mmol/L   CO2 21 (L) 22 - 32 mmol/L   Glucose, Bld 99 70 - 99 mg/dL   BUN <5 (L) 6 - 20 mg/dL   Creatinine, Ser 0.86 (L) 0.44 - 1.00 mg/dL    Calcium 8.1 (L) 8.9 - 10.3 mg/dL   Total Protein 6.2 (L) 6.5 - 8.1 g/dL   Albumin 2.5 (L) 3.5 - 5.0 g/dL   AST 12 (L) 15 - 41 U/L   ALT 15 0 - 44 U/L   Alkaline Phosphatase 87 38 - 126 U/L   Total Bilirubin 0.6 0.3 - 1.2 mg/dL   GFR, Estimated >57 >84 mL/min   Anion gap 7 5 - 15    Treatments: IV hydration, analgesia: Morphine, antiemetics, IV venofer  Hospital Course:  The patient is a 27 year old gravida 2, para 0, with 1 previous full-term pregnancy who is at 29 weeks and 1 day gestation. She was admitted due to nausea and vomiting during pregnancy, with no signs of amniotic fluid leakage or vaginal bleeding. The patient received scheduled antiemetics according to the hyperemesis protocol and her diet was advanced as tolerated during her stay.  Upon being informed of her impending discharge by the CNM, the patient expressed a desire to see a psychiatrist. When asked about the reason for her request, she disclosed that she had upset her boyfriend, who now refuses to come home, and expressed feelings of guilt and hopelessness. She denied any thoughts of self-harm or harming others but admitted to feeling sad and hopeless. Although she has a therapist appointment scheduled for tomorrow and a prenatal follow-up appointment on August 5th, I encouraged her to contact her healthcare provider if her emotional state persist. The patient mentioned having a psychiatrist whom she sees regularly but indicated that her next appointment is approximately 30 days away. A psychiatric consultation was requested, but it was declined, and the patient was considered stable for discharge with outpatient follow-up care.  Discharge Physical Exam:  BP (!) 102/53 (BP Location: Left Arm)   Pulse 66   Temp 98.8 F (37.1 C)   Resp 18   Ht 5\' 3"  (1.6 m)   Wt 111.2 kg   LMP 03/07/2022 (Approximate)   SpO2 96%   BMI 43.44 kg/m   General: NAD CV: RRR Pulm: CTABL, nl effort ABD: s/nd/nt,  gravid DVT Evaluation: LE  non-ttp, no evidence of DVT on exam.  NST: FHR doppler: 150 bpm     Discharge Condition: Stable  Disposition: Discharge disposition: 01-Home or Self Care        Allergies as of 09/27/2022   No Known Allergies      Medication List     TAKE these medications    acetaminophen 500 MG tablet Commonly known as: TYLENOL Take 2 tablets (1,000 mg total) by mouth every 6 (six) hours as needed (for pain scale < 4  OR  temperature  >/=  100.5 F).   aspirin EC 81 MG tablet Take 1 tablet (81 mg total) by mouth daily. Swallow whole. What changed: additional instructions   famotidine 40 MG/5ML suspension Commonly known as: PEPCID Take 10 mg by mouth daily.   ferrous sulfate 325 (65 FE) MG tablet Take 1 tablet (325 mg total) by mouth 2 (two) times daily with a meal. For anemia, take with Vitamin C   metoCLOPramide 5 MG/5ML solution Commonly known as: REGLAN Take 5 mg by mouth 4 (four) times daily -  before meals and at bedtime.   ondansetron 4 MG disintegrating tablet Commonly known as: ZOFRAN-ODT 4mg  ODT q4 hours prn nausea/vomit   sucralfate 1 g tablet Commonly known as: CARAFATE Take 1 tablet (1 g total) by mouth 4 (four) times daily -  with meals and at bedtime.        Follow-up Information     [place] Follow up on 10/02/2022.   Contact information: [facility] [address] [place] [address] [phone]                 Signed:  Chari Manning, CNM 09/27/2022 9:36 PM

## 2022-09-27 NOTE — Progress Notes (Signed)
Pt states she does not want to answer any admission questions at this time.   States this 10/10 upper abdominal pain started this morning and has not gone away. Pt seems settled/resting when nurse stands outside doorway but when nurse steps in the room, pt begins to moan and ask for any type of pain medication that she can have.

## 2022-09-27 NOTE — Progress Notes (Signed)
Discharge instructions and appointments reviewed with patient. Patient had no questions or concerns. All belongings sent with patient. Patients ride waiting downstairs. Patient wheeled down by [employer].

## 2022-09-27 NOTE — Progress Notes (Signed)
L Hand IV site infiltrated. Removed at 0450.

## 2022-09-27 NOTE — Progress Notes (Signed)
RN at bedside to administer nausea medication when patient requested to see Psych. RN asked what was wrong and patient stated that she made her boyfriend mad and now he does not want to come home and states, "it is all my fault" and "I do not even want to get better". Patient denies SI and HI but states she feels "sad and hopeless". RN spoke with Chari Manning CNM and she states she will reach out to on call provider.

## 2022-09-27 NOTE — Progress Notes (Signed)
CNM at bedside discussing medications, mental health, and reviewing discharge. Patient agreeable and denied any thoughts of harming herself, baby, or others. CNM to discharge patient. Will give 2200 meds and print discharge paperwork. Patients Nana to come pick patient up.

## 2022-09-28 DIAGNOSIS — F331 Major depressive disorder, recurrent, moderate: Secondary | ICD-10-CM | POA: Diagnosis not present

## 2022-09-28 DIAGNOSIS — Z419 Encounter for procedure for purposes other than remedying health state, unspecified: Secondary | ICD-10-CM | POA: Diagnosis not present

## 2022-09-28 DIAGNOSIS — F411 Generalized anxiety disorder: Secondary | ICD-10-CM | POA: Diagnosis not present

## 2022-10-03 ENCOUNTER — Encounter: Payer: Self-pay | Admitting: Obstetrics and Gynecology

## 2022-10-03 ENCOUNTER — Other Ambulatory Visit: Payer: Medicaid Other | Admitting: Obstetrics and Gynecology

## 2022-10-03 NOTE — Patient Instructions (Signed)
Hi Zoe Hunter, I am glad you are feeling better, thank you for giving me updates-have a nice afternoon!!  Zoe Hunter was given information about Medicaid Managed Care team care coordination services as a part of their [facility] Medicaid benefit. Zoe Hunter verbally consented to engagement with the [facility] Managed Care team.   If you are experiencing a medical emergency, please call 911 or report to your local emergency department or urgent care.   If you have a non-emergency medical problem during routine business hours, please contact your provider's office and ask to speak with a nurse.   For questions related to your [facility] health plan, please call: [phone] or go here:[email or web address]  If you would like to schedule transportation through your [facility] plan, please call the following number at least 2 days in advance of your appointment: [phone].   You can also use the MTM portal or MTM mobile app to manage your rides. Reimbursement for transportation is available through [facility]! For the portal, please go to [email or web address]  Call the [facility] Crisis Line at [phone])[phone], at any time, 24 hours a day, 7 days a week. If you are in danger or need immediate medical attention call 911.  If you would like help to quit smoking, call 1-800-QUIT-NOW (779-717-1023) OR Espaol: 1-855-Djelo-Ya (2-542-706-2376) o para ms informacin haga clic aqu or Text READY to 283-151 to register via text  Zoe Hunter - following are the goals we discussed in your visit today:   Goals Addressed    Timeframe:  Short-Term Goal Priority:  High Start Date:   08/18/22                          Expected End Date:  ongoing                     Follow Up Date 11/03/22   - schedule appointment for vaccines needed due to my age or health - schedule recommended health tests (blood work, mammogram, colonoscopy, pap test) - schedule and keep appointment for annual check-up  / OB appts   Why is this important?   Screening tests can find diseases early when they are easier to treat.  Your doctor or nurse will talk with you about which tests are important for you.  Getting shots for common diseases like the flu and shingles will help prevent them.   10/03/22:  Patient has f/u appts scheduled  for OB, therapist and Psychiatry  Patient verbalizes understanding of instructions and care plan provided today and agrees to view in MyChart. Active MyChart status and patient understanding of how to access instructions and care plan via MyChart confirmed with patient.     The Managed Medicaid care management team will reach out to the patient again over the next 30 business  days.  The  Patient  has been provided with contact information for the Managed Medicaid care management team and has been advised to call with any health related questions or concerns.   Kathi Der RN, BSN [place]  [facility] Care Management Coordinator - Managed Medicaid High Risk [phone]   Following is a copy of your plan of care:  Care Plan : RN Care Manager Plan of Care  Updates made by [name], RN since 10/03/2022 12:00 AM     Problem: Health Promotion or Disease Self-Management (General Plan of Care)      Goal: OB Case Management  Start Date: 08/18/2022  Expected End Date: 11/18/2022  Priority: High  Note:   Current Barriers:  Knowledge Deficits related to plan of care for management of GERD, ADHD, BP, anxiety  depression, adjustment disorder, [redacted] weeks gestation with nausea and vomiting, hip and pelvic pain Chronic Disease Management support and education needs related to GERD, ADHD, BP, anxiety depression, adjustment disorder, [redacted]weeks gestation with nausea and vomiting, hip and pelvic pain 10/03/22:  [redacted] weeks gestation-nausea and vomiting improved.  Scheduled for iron infusions.  Hip/pelvic pain continue-? PT.  Continues appt with therapist.   RNCM Clinical  Goal(s):  Patient will verbalize understanding of plan for management of GERD, ADHD, BP,  anxiety, depression, adjustment disorder, [redacted]weeks gestation with nausea and vomiting, hip and pelvic pain  as evidenced by patient report verbalize basic understanding of GERD, ADHD, BP,  anxiety, depression, adjustment disorder, [redacted] weeks gestation with nausea and vomiting, hip and pelvic pain  disease process and self health management plan as evidenced by patient report take all medications exactly as prescribed and will call provider for medication related questions as evidenced by patient report demonstrate understanding of rationale for each prescribed medication as evidenced by patient report attend all scheduled medical appointments  as evidenced by patient report and EMR review demonstrate ongoing  adherence to prescribed treatment plan for GERD, ADHD, BP,  anxiety, depression, adjustment disorder, [redacted] weeks gestation with nausea and vomiting, hip and pelvic pain  as evidenced by patient report and EMR review. continue to work with RN Care Manager to address care management and care coordination needs related to  GERD, ADHD, BP,  anxiety, depression, adjustment disorder, [redacted] weeks gestation with nausea and vomiting, hip and pelvic pain as evidenced by adherence to CM Team Scheduled appointments experience decrease in ED visits as evidenced by EMR review.  ED visits in in last 6 months = 5 through collaboration with RN Care manager, provider, and care team.   Interventions: Inter-disciplinary care team collaboration (see longitudinal plan of care) Evaluation of current treatment plan related to  self management and patient's adherence to plan as established by provider Patient provided with Office of Patient Experience contact information-email. Patient encouraged to continue home PT exercises.    Nausea and Vomiting:  (Status:  New goal.)  Short Term Goal Recommended adequate rest    , Advised to follow  routine screening guidelines as outlined by obstetrics provider   , and Reviewed prescribed diet and encouraged optimizing nutrition during pregnancy       Patient Goals/Self-Care Activities: Take all medications as prescribed Attend all scheduled provider appointments Call pharmacy for medication refills 3-7 days in advance of running out of medications Perform all self care activities independently  Perform IADL's (shopping, preparing meals, housekeeping, managing finances) independently Call provider office for new concerns or questions   Follow Up Plan:  The patient has been provided with contact information for the care management team and has been advised to call with any health related questions or concerns.  The care management team will reach out to the patient again over the next 30 business  days.

## 2022-10-03 NOTE — Patient Outreach (Signed)
Medicaid Managed Care   Nurse Care Manager Note  10/03/2022 Name:  Zoe Hunter MRN:  [id number] DOB:  August 18, 1995  Zoe Hunter is an 27 y.o. year old female who is a primary patient of [name], MD.  The Medicaid Managed Care Coordination team was consulted for assistance with:    Obstetrics healthcare management needs  [name] was given information about Medicaid Managed Care Coordination team services today. Zoe Hunter Patient agreed to services and verbal consent obtained.  Engaged with patient by phone for follow up visit in response to provider referral for case management and/or care coordination services.   Assessments/Interventions:  Review of past medical history, allergies, medications, health status, including review of consultants reports, laboratory and other test data, was performed as part of comprehensive evaluation and provision of chronic care management services.  SDOH (Social Determinants of Health) assessments and interventions performed: SDOH Interventions    Flowsheet Row Patient Outreach Telephone from 10/03/2022 in [place] POPULATION HEALTH DEPARTMENT Patient Outreach Telephone from 08/18/2022 in [place] POPULATION HEALTH DEPARTMENT  SDOH Interventions    Alcohol Usage Interventions -- Intervention Not Indicated (Score <7)  Depression Interventions/Treatment  -- Currently on Treatment  Physical Activity Interventions Other (Comments)  [hip/pelvic pain-? PT] --  Stress Interventions Provide Counseling  [Psychiatris appt 8/15, therapist weekly] --     Care Plan No Known Allergies  Medications Reviewed Today     Reviewed by [name], RN (Registered Nurse) on 10/03/22 at 1519  Med List Status: <None>   Medication Order Taking? Sig Documenting Provider Last Dose Status Informant  acetaminophen (TYLENOL) 500 MG tablet [id number]  Take 2 tablets (1,000 mg total) by mouth every 6 (six) hours as needed (for pain scale < 4  OR  temperature  >/=   100.5 F). Chari Manning Rolla Plate, CNM  Active   aspirin EC 81 MG tablet [id number]  Take 1 tablet (81 mg total) by mouth daily. Swallow whole.  Patient taking differently: Take 81 mg by mouth daily. Swallow whole.Doesn't take everyday   Chari Manning Rolla Plate, CNM  Active Self  famotidine (PEPCID) 40 MG/5ML suspension [id number]  Take 10 mg by mouth daily. [provider]  Active   ferrous sulfate 325 (65 FE) MG tablet [id number]  Take 1 tablet (325 mg total) by mouth 2 (two) times daily with a meal. For anemia, take with Vitamin C [name], [name], CNM  Active   hydrOXYzine (ATARAX) 25 MG tablet [id number] Yes Take 25 mg by mouth 3 (three) times daily as needed for anxiety. [provider]  Active Self  metoCLOPramide (REGLAN) 5 MG/5ML solution [id number]  Take 5 mg by mouth 4 (four) times daily -  before meals and at bedtime. [provider]  Active   ondansetron (ZOFRAN-ODT) 4 MG disintegrating tablet [id number]  4mg  ODT q4 hours prn nausea/vomit [name], MD  Active   sucralfate (CARAFATE) 1 g tablet [id number]  Take 1 tablet (1 g total) by mouth 4 (four) times daily -  with meals and at bedtime. Chari Manning Rolla Plate, CNM  Active            Patient Active Problem List   Diagnosis Date Noted   Cannabinoid hyperemesis syndrome 09/27/2022   Nausea and vomiting during pregnancy 09/26/2022   Abdominal pain during pregnancy in second trimester 09/03/2022   Anemia during pregnancy in third trimester 09/03/2022   Marijuana use during pregnancy 09/03/2022   Anxiety during  pregnancy in second trimester, antepartum 06/29/2022   Hyperemesis gravidarum 05/28/2022   GERD (gastroesophageal reflux disease) 06/02/2021   Seizure-like activity (HCC) 08/17/2020   Anxiety 11/14/2018   Cannabis use disorder 11/14/2018   Adjustment disorder with depressed mood 11/11/2018   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    LGSIL on Pap smear of cervix 05/24/2017   ADD (attention deficit disorder) 02/19/2015   Bipolar affective disorder (HCC) 02/19/2015   Clinical depression 02/19/2015   Cholelithiasis 01/28/2015   Conditions to be addressed/monitored per PCP order:  obstetric healthcare management needs, nausea and vomiting, anemia, BP, anxiety, depression, adjustment disorder, GERD, ADHD  Care Plan : RN Care Manager Plan of Care  Updates made by [name], RN since 10/03/2022 12:00 AM     Problem: Health Promotion or Disease Self-Management (General Plan of Care)      Goal: OB Case Management   Start Date: 08/18/2022  Expected End Date: 11/18/2022  Priority: High  Note:   Current Barriers:  Knowledge Deficits related to plan of care for management of GERD, ADHD, BP, anxiety  depression, adjustment disorder, [redacted] weeks gestation with nausea and vomiting, hip and pelvic pain Chronic Disease Management support and education needs related to GERD, ADHD, BP, anxiety depression, adjustment disorder, [redacted]weeks gestation with nausea and vomiting, hip and pelvic pain 10/03/22:  [redacted] weeks gestation-nausea and vomiting improved.  Scheduled for iron infusions.  Hip/pelvic pain continue-? PT.  Continues appt with therapist.   RNCM Clinical Goal(s):  Patient will verbalize understanding of plan for management of GERD, ADHD, BP,  anxiety, depression, adjustment disorder, [redacted]weeks gestation with nausea and vomiting, hip and pelvic pain  as evidenced by patient report verbalize basic understanding of GERD, ADHD, BP,  anxiety, depression, adjustment disorder, [redacted] weeks gestation with nausea and vomiting, hip and pelvic pain  disease process and self health management plan as evidenced by patient report take all medications exactly as prescribed and will call provider for medication related questions as evidenced by patient report demonstrate understanding of rationale for each prescribed medication as evidenced by patient  report attend all scheduled medical appointments  as evidenced by patient report and EMR review demonstrate ongoing  adherence to prescribed treatment plan for GERD, ADHD, BP,  anxiety, depression, adjustment disorder, [redacted] weeks gestation with nausea and vomiting, hip and pelvic pain  as evidenced by patient report and EMR review. continue to work with RN Care Manager to address care management and care coordination needs related to  GERD, ADHD, BP,  anxiety, depression, adjustment disorder, [redacted] weeks gestation with nausea and vomiting, hip and pelvic pain as evidenced by adherence to CM Team Scheduled appointments experience decrease in ED visits as evidenced by EMR review.  ED visits in in last 6 months = 5 through collaboration with RN Care manager, provider, and care team.   Interventions: Inter-disciplinary care team collaboration (see longitudinal plan of care) Evaluation of current treatment plan related to  self management and patient's adherence to plan as established by provider Patient provided with Office of Patient Experience contact information-email. Patient encouraged to continue home PT exercises.    Nausea and Vomiting:  (Status:  New goal.)  Short Term Goal Recommended adequate rest    , Advised to follow routine screening guidelines as outlined by obstetrics provider   , and Reviewed prescribed diet and encouraged optimizing nutrition during pregnancy       Patient Goals/Self-Care Activities: Take all medications as prescribed Attend all scheduled  provider appointments Call pharmacy for medication refills 3-7 days in advance of running out of medications Perform all self care activities independently  Perform IADL's (shopping, preparing meals, housekeeping, managing finances) independently Call provider office for new concerns or questions   Follow Up Plan:  The patient has been provided with contact information for the care management team and has been advised to call with  any health related questions or concerns.  The care management team will reach out to the patient again over the next 30 business  days.   Goal: Establish Plan of Care for OB Case Management   Priority: High  Note:   Timeframe:  Short-Term Goal Priority:  High Start Date:   08/18/22                          Expected End Date:  ongoing                     Follow Up Date 11/03/22   - schedule appointment for vaccines needed due to my age or health - schedule recommended health tests (blood work, mammogram, colonoscopy, pap test) - schedule and keep appointment for annual check-up / OB appts   Why is this important?   Screening tests can find diseases early when they are easier to treat.  Your doctor or nurse will talk with you about which tests are important for you.  Getting shots for common diseases like the flu and shingles will help prevent them.   10/03/22:  Patient has f/u appts scheduled  for OB, therapist and Psychiatry   Follow Up:  Patient agrees to Care Plan and Follow-up.  Plan: The Managed Medicaid care management team will reach out to the patient again over the next 30 business  days. and The  Patient has been provided with contact information for the Managed Medicaid care management team and has been advised to call with any health related questions or concerns.  Date/time of next scheduled RN care management/care coordination outreach:  11/03/22 at 1115

## 2022-10-05 DIAGNOSIS — F900 Attention-deficit hyperactivity disorder, predominantly inattentive type: Secondary | ICD-10-CM | POA: Diagnosis not present

## 2022-10-05 DIAGNOSIS — F902 Attention-deficit hyperactivity disorder, combined type: Secondary | ICD-10-CM | POA: Diagnosis not present

## 2022-10-07 DIAGNOSIS — O2243 Hemorrhoids in pregnancy, third trimester: Secondary | ICD-10-CM | POA: Diagnosis not present

## 2022-10-07 DIAGNOSIS — Z3A30 30 weeks gestation of pregnancy: Secondary | ICD-10-CM | POA: Diagnosis not present

## 2022-10-09 DIAGNOSIS — R109 Unspecified abdominal pain: Secondary | ICD-10-CM | POA: Diagnosis not present

## 2022-10-09 DIAGNOSIS — O26899 Other specified pregnancy related conditions, unspecified trimester: Secondary | ICD-10-CM | POA: Diagnosis not present

## 2022-10-09 DIAGNOSIS — Z3A29 29 weeks gestation of pregnancy: Secondary | ICD-10-CM | POA: Diagnosis not present

## 2022-10-09 DIAGNOSIS — O99013 Anemia complicating pregnancy, third trimester: Secondary | ICD-10-CM | POA: Diagnosis not present

## 2022-10-09 DIAGNOSIS — D509 Iron deficiency anemia, unspecified: Secondary | ICD-10-CM | POA: Diagnosis not present

## 2022-10-09 DIAGNOSIS — Z3689 Encounter for other specified antenatal screening: Secondary | ICD-10-CM | POA: Diagnosis not present

## 2022-10-12 DIAGNOSIS — F902 Attention-deficit hyperactivity disorder, combined type: Secondary | ICD-10-CM | POA: Diagnosis not present

## 2022-10-12 DIAGNOSIS — F900 Attention-deficit hyperactivity disorder, predominantly inattentive type: Secondary | ICD-10-CM | POA: Diagnosis not present

## 2022-10-17 DIAGNOSIS — R1010 Upper abdominal pain, unspecified: Secondary | ICD-10-CM | POA: Diagnosis not present

## 2022-10-17 DIAGNOSIS — R109 Unspecified abdominal pain: Secondary | ICD-10-CM | POA: Diagnosis not present

## 2022-10-17 DIAGNOSIS — O219 Vomiting of pregnancy, unspecified: Secondary | ICD-10-CM | POA: Diagnosis not present

## 2022-10-17 DIAGNOSIS — Z3A32 32 weeks gestation of pregnancy: Secondary | ICD-10-CM | POA: Diagnosis not present

## 2022-10-17 DIAGNOSIS — O26893 Other specified pregnancy related conditions, third trimester: Secondary | ICD-10-CM | POA: Diagnosis not present

## 2022-10-17 DIAGNOSIS — O99891 Other specified diseases and conditions complicating pregnancy: Secondary | ICD-10-CM | POA: Diagnosis not present

## 2022-10-17 DIAGNOSIS — K589 Irritable bowel syndrome without diarrhea: Secondary | ICD-10-CM | POA: Diagnosis not present

## 2022-10-17 DIAGNOSIS — O99613 Diseases of the digestive system complicating pregnancy, third trimester: Secondary | ICD-10-CM | POA: Diagnosis not present

## 2022-10-17 DIAGNOSIS — O35EXX0 Maternal care for other (suspected) fetal abnormality and damage, fetal genitourinary anomalies, not applicable or unspecified: Secondary | ICD-10-CM | POA: Diagnosis not present

## 2022-10-17 DIAGNOSIS — O218 Other vomiting complicating pregnancy: Secondary | ICD-10-CM | POA: Diagnosis not present

## 2022-10-19 DIAGNOSIS — F411 Generalized anxiety disorder: Secondary | ICD-10-CM | POA: Diagnosis not present

## 2022-10-19 DIAGNOSIS — D509 Iron deficiency anemia, unspecified: Secondary | ICD-10-CM | POA: Diagnosis not present

## 2022-10-19 DIAGNOSIS — F331 Major depressive disorder, recurrent, moderate: Secondary | ICD-10-CM | POA: Diagnosis not present

## 2022-10-23 DIAGNOSIS — F331 Major depressive disorder, recurrent, moderate: Secondary | ICD-10-CM | POA: Diagnosis not present

## 2022-10-23 DIAGNOSIS — F411 Generalized anxiety disorder: Secondary | ICD-10-CM | POA: Diagnosis not present

## 2022-10-26 DIAGNOSIS — O99013 Anemia complicating pregnancy, third trimester: Secondary | ICD-10-CM | POA: Diagnosis not present

## 2022-10-26 DIAGNOSIS — D509 Iron deficiency anemia, unspecified: Secondary | ICD-10-CM | POA: Diagnosis not present

## 2022-10-26 DIAGNOSIS — O35EXX0 Maternal care for other (suspected) fetal abnormality and damage, fetal genitourinary anomalies, not applicable or unspecified: Secondary | ICD-10-CM | POA: Diagnosis not present

## 2022-10-26 DIAGNOSIS — Z3A33 33 weeks gestation of pregnancy: Secondary | ICD-10-CM | POA: Diagnosis not present

## 2022-10-29 DIAGNOSIS — Z419 Encounter for procedure for purposes other than remedying health state, unspecified: Secondary | ICD-10-CM | POA: Diagnosis not present

## 2022-10-31 DIAGNOSIS — Z3482 Encounter for supervision of other normal pregnancy, second trimester: Secondary | ICD-10-CM | POA: Diagnosis not present

## 2022-10-31 DIAGNOSIS — Z3483 Encounter for supervision of other normal pregnancy, third trimester: Secondary | ICD-10-CM | POA: Diagnosis not present

## 2022-11-02 DIAGNOSIS — F902 Attention-deficit hyperactivity disorder, combined type: Secondary | ICD-10-CM | POA: Diagnosis not present

## 2022-11-02 DIAGNOSIS — F411 Generalized anxiety disorder: Secondary | ICD-10-CM | POA: Diagnosis not present

## 2022-11-02 DIAGNOSIS — F331 Major depressive disorder, recurrent, moderate: Secondary | ICD-10-CM | POA: Diagnosis not present

## 2022-11-03 ENCOUNTER — Other Ambulatory Visit: Payer: Medicaid Other | Admitting: Obstetrics and Gynecology

## 2022-11-03 NOTE — Patient Instructions (Signed)
Hi Ms. Knabel, thank you for updating me today-I hope you feel better!  Ms. Garneau was given information about Medicaid Managed Care team care coordination services as a part of their [facility] Medicaid benefit. [name] verbally consented to engagement with the [facility] Managed Care team.   If you are experiencing a medical emergency, please call 911 or report to your local emergency department or urgent care.   If you have a non-emergency medical problem during routine business hours, please contact your provider's office and ask to speak with a nurse.   For questions related to your [employer] health plan, please call: [phone] or go here:[email or web address]  If you would like to schedule transportation through your [facility] plan, please call the following number at least 2 days in advance of your appointment: [phone].   You can also use the MTM portal or MTM mobile app to manage your rides. Reimbursement for transportation is available through [facility]! For the portal, please go to [email or web address]  Call the [facility] Crisis Line at [phone], at any time, 24 hours a day, 7 days a week. If you are in danger or need immediate medical attention call 911.  If you would like help to quit smoking, call 1-800-QUIT-NOW (303-173-4219) OR Espaol: 1-855-Djelo-Ya (4-132-440-1027) o para ms informacin haga clic aqu or Text READY to 253-664 to register via text  Ms. Kotter - following are the goals we discussed in your visit today:   Goals Addressed    Timeframe:  Short-Term Goal Priority:  High Start Date:   08/18/22                          Expected End Date:  ongoing                     Follow Up Date 12/01/22   - schedule appointment for vaccines needed due to my age or health - schedule recommended health tests (blood work, mammogram, colonoscopy, pap test) - schedule and keep appointment for annual check-up / OB appts   Why is this  important?   Screening tests can find diseases early when they are easier to treat.  Your doctor or nurse will talk with you about which tests are important for you.  Getting shots for common diseases like the flu and shingles will help prevent them.   11/03/22:  GI appt 9/11-regular OB and therapy appts.  Patient verbalizes understanding of instructions and care plan provided today and agrees to view in MyChart. Active MyChart status and patient understanding of how to access instructions and care plan via MyChart confirmed with patient.     The Managed Medicaid care management team will reach out to the patient again over the next 30 business  days.  The  Patient  has been provided with contact information for the Managed Medicaid care management team and has been advised to call with any health related questions or concerns.   Kathi Der RN, BSN [place]  [facility] Care Management Coordinator - Managed Medicaid High Risk [phone]   Following is a copy of your plan of care:  Care Plan : RN Care Manager Plan of Care  Updates made by [name], RN since 11/03/2022 12:00 AM     Problem: Health Promotion or Disease Self-Management (General Plan of Care)      Goal: OB Case Management   Start Date: 08/18/2022  Expected End Date: 02/02/2023  Priority: High  Note:   Current Barriers:  Knowledge Deficits related to plan of care for management of GERD, ADHD, BP, anxiety  depression, adjustment disorder, 34.[redacted] weeks gestation with nausea and vomiting, hip and pelvic pain Chronic Disease Management support and education needs related to GERD, ADHD, BP, anxiety depression, adjustment disorder, 34.[redacted] weeks gestation with nausea and vomiting, hip and pelvic pain 11/03/22: 34.[redacted] weeks gestation-nausea has returned-eating and drinking ok-little relief with Reglan, states she will follow up with provider next week at scheduled appt.  No iron infusions right now.  No change in hip  pain.  Has GI appt next week to evaluate abdominal pain.    RNCM Clinical Goal(s):  Patient will verbalize understanding of plan for management of GERD, ADHD, BP,  anxiety, depression, adjustment disorder, [redacted]weeks gestation with nausea and vomiting, hip and pelvic pain  as evidenced by patient report verbalize basic understanding of GERD, ADHD, BP,  anxiety, depression, adjustment disorder, [redacted] weeks gestation with nausea and vomiting, hip and pelvic pain  disease process and self health management plan as evidenced by patient report take all medications exactly as prescribed and will call provider for medication related questions as evidenced by patient report demonstrate understanding of rationale for each prescribed medication as evidenced by patient report attend all scheduled medical appointments  as evidenced by patient report and EMR review demonstrate ongoing  adherence to prescribed treatment plan for GERD, ADHD, BP,  anxiety, depression, adjustment disorder, [redacted] weeks gestation with nausea and vomiting, hip and pelvic pain  as evidenced by patient report and EMR review. continue to work with RN Care Manager to address care management and care coordination needs related to  GERD, ADHD, BP,  anxiety, depression, adjustment disorder, [redacted] weeks gestation with nausea and vomiting, hip and pelvic pain as evidenced by adherence to CM Team Scheduled appointments experience decrease in ED visits as evidenced by EMR review.  ED visits in in last 6 months = 5 through collaboration with RN Care manager, provider, and care team.   Interventions: Inter-disciplinary care team collaboration (see longitudinal plan of care) Evaluation of current treatment plan related to  self management and patient's adherence to plan as established by provider Patient provided with Office of Patient Experience contact information-email. Patient encouraged to continue home PT exercises.    Nausea and Vomiting:  (Status:  New  goal.)  Short Term Goal Recommended adequate rest    , Advised to follow routine screening guidelines as outlined by obstetrics provider   , and Reviewed prescribed diet and encouraged optimizing nutrition during pregnancy       Patient Goals/Self-Care Activities: Take all medications as prescribed Attend all scheduled provider appointments Call pharmacy for medication refills 3-7 days in advance of running out of medications Perform all self care activities independently  Perform IADL's (shopping, preparing meals, housekeeping, managing finances) independently Call provider office for new concerns or questions   Follow Up Plan:  The patient has been provided with contact information for the care management team and has been advised to call with any health related questions or concerns.  The care management team will reach out to the patient again over the next 30 business  days.

## 2022-11-03 NOTE — Patient Outreach (Signed)
Medicaid Managed Care   Nurse Care Manager Note  11/03/2022 Name:  Zoe Hunter MRN:  [id number] DOB:  [birth year]  Zoe Hunter is an 27 y.o. year old female who is a primary patient of Ernestina Penna, MD.  The [facility] Managed Care Coordination team was consulted for assistance with:    Obstetrics healthcare management needs  [name] was given information about Medicaid Managed Care Coordination team services today. Zoe Hunter Patient agreed to services and verbal consent obtained.  Engaged with patient by telephone for follow up visit in response to provider referral for case management and/or care coordination services.   Assessments/Interventions:  Review of past medical history, allergies, medications, health status, including review of consultants reports, laboratory and other test data, was performed as part of comprehensive evaluation and provision of chronic care management services.  SDOH (Social Determinants of Health) assessments and interventions performed: SDOH Interventions    Flowsheet Row Patient Outreach Telephone from 11/03/2022 in [place] POPULATION HEALTH DEPARTMENT Patient Outreach Telephone from 10/03/2022 in [place] POPULATION HEALTH DEPARTMENT Patient Outreach Telephone from 08/18/2022 in  POPULATION HEALTH DEPARTMENT  SDOH Interventions     Food Insecurity Interventions Intervention Not Indicated -- --  Housing Interventions Intervention Not Indicated -- --  Alcohol Usage Interventions -- -- Intervention Not Indicated (Score <7)  Depression Interventions/Treatment  -- -- Currently on Treatment  Physical Activity Interventions -- Other (Comments)  [hip/pelvic pain-? PT] --  Stress Interventions -- Provide Counseling  [Psychiatris appt 8/15, therapist weekly] --     Care Plan No Known Allergies  Medications Reviewed Today   Medications were not reviewed in this encounter    Patient Active Problem List   Diagnosis Date Noted   Cannabinoid  hyperemesis syndrome 09/27/2022   Nausea and vomiting during pregnancy 09/26/2022   Abdominal pain during pregnancy in second trimester 09/03/2022   Anemia during pregnancy in third trimester 09/03/2022   Marijuana use during pregnancy 09/03/2022   Anxiety during pregnancy in second trimester, antepartum 06/29/2022   Hyperemesis gravidarum 05/28/2022   GERD (gastroesophageal reflux disease) 06/02/2021   Seizure-like activity (HCC) 08/17/2020   Anxiety 11/14/2018   Cannabis use disorder 11/14/2018   Adjustment disorder with depressed mood 11/11/2018   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   LGSIL on Pap smear of cervix 05/24/2017   ADD (attention deficit disorder) 02/19/2015   Bipolar affective disorder (HCC) 02/19/2015   Clinical depression 02/19/2015   Cholelithiasis 01/28/2015   Conditions to be addressed/monitored per PCP order:   obstetric case management needs, nausea and vomiting, hip pain, BP/anxiety/depression/adjustment disorder, ADHD, GERD  Care Plan : RN Care Manager Plan of Care  Updates made by [name], RN since 11/03/2022 12:00 AM     Problem: Health Promotion or Disease Self-Management (General Plan of Care)      Goal: OB Case Management   Start Date: 08/18/2022  Expected End Date: 02/02/2023  Priority: High  Note:   Current Barriers:  Knowledge Deficits related to plan of care for management of GERD, ADHD, BP, anxiety  depression, adjustment disorder, 34.[redacted] weeks gestation with nausea and vomiting, hip and pelvic pain Chronic Disease Management support and education needs related to GERD, ADHD, BP, anxiety depression, adjustment disorder, 34.[redacted] weeks gestation with nausea and vomiting, hip and pelvic pain 11/03/22: 34.[redacted] weeks gestation-nausea has returned-eating and drinking ok-little relief with Reglan, states she will follow up with provider next week at scheduled appt.  No iron  infusions right now.  No change in hip pain.  Has GI appt  next week to evaluate abdominal pain.    RNCM Clinical Goal(s):  Patient will verbalize understanding of plan for management of GERD, ADHD, BP,  anxiety, depression, adjustment disorder, [redacted]weeks gestation with nausea and vomiting, hip and pelvic pain  as evidenced by patient report verbalize basic understanding of GERD, ADHD, BP,  anxiety, depression, adjustment disorder, [redacted] weeks gestation with nausea and vomiting, hip and pelvic pain  disease process and self health management plan as evidenced by patient report take all medications exactly as prescribed and will call provider for medication related questions as evidenced by patient report demonstrate understanding of rationale for each prescribed medication as evidenced by patient report attend all scheduled medical appointments  as evidenced by patient report and EMR review demonstrate ongoing  adherence to prescribed treatment plan for GERD, ADHD, BP,  anxiety, depression, adjustment disorder, [redacted] weeks gestation with nausea and vomiting, hip and pelvic pain  as evidenced by patient report and EMR review. continue to work with RN Care Manager to address care management and care coordination needs related to  GERD, ADHD, BP,  anxiety, depression, adjustment disorder, [redacted] weeks gestation with nausea and vomiting, hip and pelvic pain as evidenced by adherence to CM Team Scheduled appointments experience decrease in ED visits as evidenced by EMR review.  ED visits in in last 6 months = 5 through collaboration with RN Care manager, provider, and care team.   Interventions: Inter-disciplinary care team collaboration (see longitudinal plan of care) Evaluation of current treatment plan related to  self management and patient's adherence to plan as established by provider Patient provided with Office of Patient Experience contact information-email. Patient encouraged to continue home PT exercises.    Nausea and Vomiting:  (Status:  New goal.)  Short Term  Goal Recommended adequate rest    , Advised to follow routine screening guidelines as outlined by obstetrics provider   , and Reviewed prescribed diet and encouraged optimizing nutrition during pregnancy       Patient Goals/Self-Care Activities: Take all medications as prescribed Attend all scheduled provider appointments Call pharmacy for medication refills 3-7 days in advance of running out of medications Perform all self care activities independently  Perform IADL's (shopping, preparing meals, housekeeping, managing finances) independently Call provider office for new concerns or questions   Follow Up Plan:  The patient has been provided with contact information for the care management team and has been advised to call with any health related questions or concerns.  The care management team will reach out to the patient again over the next 30 business  days.     Goal: Establish Plan of Care for OB Case Management   Priority: High  Note:   Timeframe:  Short-Term Goal Priority:  High Start Date:   08/18/22                          Expected End Date:  ongoing                     Follow Up Date 12/01/22   - schedule appointment for vaccines needed due to my age or health - schedule recommended health tests (blood work, mammogram, colonoscopy, pap test) - schedule and keep appointment for annual check-up / OB appts   Why is this important?   Screening tests can find diseases early when they are easier to treat.  Your doctor or nurse will talk with you about which tests are important for you.  Getting shots for common diseases like the flu and shingles will help prevent them.   11/03/22:  GI appt 9/11-regular OB and therapy appts.   Follow Up:  Patient agrees to Care Plan and Follow-up.  Plan: The Managed Medicaid care management team will reach out to the patient again over the next 30 business  days. and The  Patient has been provided with contact information for the Managed Medicaid  care management team and has been advised to call with any health related questions or concerns.  Date/time of next scheduled RN care management/care coordination outreach:  12/01/22 at 1230

## 2022-11-06 DIAGNOSIS — F411 Generalized anxiety disorder: Secondary | ICD-10-CM | POA: Diagnosis not present

## 2022-11-06 DIAGNOSIS — F331 Major depressive disorder, recurrent, moderate: Secondary | ICD-10-CM | POA: Diagnosis not present

## 2022-11-08 ENCOUNTER — Ambulatory Visit (INDEPENDENT_AMBULATORY_CARE_PROVIDER_SITE_OTHER): Payer: Medicaid Other | Admitting: Internal Medicine

## 2022-11-08 ENCOUNTER — Encounter: Payer: Self-pay | Admitting: Internal Medicine

## 2022-11-08 VITALS — BP 99/65 | HR 84 | Temp 97.5°F | Ht 63.0 in | Wt 254.1 lb

## 2022-11-08 DIAGNOSIS — R1033 Periumbilical pain: Secondary | ICD-10-CM | POA: Diagnosis not present

## 2022-11-08 DIAGNOSIS — R194 Change in bowel habit: Secondary | ICD-10-CM

## 2022-11-08 DIAGNOSIS — K625 Hemorrhage of anus and rectum: Secondary | ICD-10-CM

## 2022-11-08 DIAGNOSIS — Z3A29 29 weeks gestation of pregnancy: Secondary | ICD-10-CM | POA: Diagnosis not present

## 2022-11-08 DIAGNOSIS — O219 Vomiting of pregnancy, unspecified: Secondary | ICD-10-CM

## 2022-11-08 DIAGNOSIS — G8929 Other chronic pain: Secondary | ICD-10-CM | POA: Diagnosis not present

## 2022-11-08 NOTE — Patient Instructions (Signed)
We will tentatively plan on colonoscopy to further evaluate your abdominal issues in a few months.  Follow-up in November to discuss further.  It was nice meeting you today.  Dr. Marletta Lor

## 2022-11-08 NOTE — Progress Notes (Signed)
Primary Care Physician:  Ernestina Penna, MD Primary Gastroenterologist:  Dr. Marletta Lor  Chief Complaint  Patient presents with   Abdominal Pain    Patient here today due to issues with abdominal pain, symptoms are life long. Patient says with her being pregnant she is having issues with rectal pain during and after bm's.    HPI:   Zoe Hunter is a 27 y.o. female who presents to the clinic today for evaluation.  Patient well-known to [facility] with a history of chronic marijuana use going back to 2022 when she was evaluated for the same.  Has a history of documented cannabinoid hyperemesis syndrome and vomiting during pregnancy and off pregnancy.  History of prior cholecystectomy.  Also has documented delayed gastric emptying.  Has been treated with Reglan in the past  29 weeks into her pregnancy G2, P0 admitted for to [facility] hospital with intractable nausea and vomiting.  Left upper quadrant pain which is what she has had in the past as well.    MRI of the abdomen in March 2024 for abdominal pain showed no abnormality. UDS positive for cannabinoids on 09/03/2022   Continues to struggle with nausea and vomiting.  Taking metoclopramide.  Main complaint for me today is worsening abdominal pain, primarily lower and periumbilical.  Mild to moderate.  Occurs before and after bowel movements.  Has noted some "dried blood" in her stools.  Also notes change in her stool itself, occasionally stringy.  Denies any constipation issues.  Occasionally loose stools.  Notes family history of numerous GI issues possible Crohn's disease.  No previous colonoscopy  Past Medical History:  Diagnosis Date   ADHD    ADHD (attention deficit hyperactivity disorder)    Anxiety    Bipolar disorder (HCC)    Cholelithiasis 01/2015   Depression    GERD (gastroesophageal reflux disease)    Suicidal ideation 11/14/2018   Transient loss of consciousness 08/17/2020    Past Surgical History:  Procedure Laterality  Date   CHOLECYSTECTOMY N/A 02/16/2015   Procedure: LAPAROSCOPIC CHOLECYSTECTOMY;  Surgeon: Natale Lay, MD;  Location: [facility] ORS;  Service: General;  Laterality: N/A;   ESOPHAGOGASTRODUODENOSCOPY (EGD) WITH PROPOFOL N/A 05/05/2016   Procedure: ESOPHAGOGASTRODUODENOSCOPY (EGD) WITH PROPOFOL;  Surgeon: Christena Deem, MD;  Location: [facility] ENDOSCOPY;  Service: Endoscopy;  Laterality: N/A;   TONSILLECTOMY     WISDOM TOOTH EXTRACTION      Current Outpatient Medications  Medication Sig Dispense Refill   acetaminophen (TYLENOL) 500 MG tablet Take 2 tablets (1,000 mg total) by mouth every 6 (six) hours as needed (for pain scale < 4  OR  temperature  >/=  100.5 F).     famotidine (PEPCID) 40 MG/5ML suspension Take 10 mg by mouth daily.     hydrOXYzine (ATARAX) 25 MG tablet Take 25 mg by mouth 3 (three) times daily as needed for anxiety.     metoCLOPramide (REGLAN) 5 MG/5ML solution Take 5 mg by mouth 4 (four) times daily -  before meals and at bedtime.     ondansetron (ZOFRAN-ODT) 4 MG disintegrating tablet 4mg  ODT q4 hours prn nausea/vomit 6 tablet 0   No current facility-administered medications for this visit.    Allergies as of 11/08/2022   (No Known Allergies)    Family History  Problem Relation Age of Onset   Hypertension Mother    Cancer Mother    Hypertension Maternal Grandmother    Cancer Other    Hypertension Maternal Grandfather  Heart disease Maternal Grandfather     Social History   Socioeconomic History   Marital status: Significant Other    Spouse name: Not on file   Number of children: Not on file   Years of education: Not on file   Highest education level: Not on file  Occupational History   Not on file  Tobacco Use   Smoking status: Never   Smokeless tobacco: Never   Tobacco comments:    boyfriend smokes  Vaping Use   Vaping status: Every Day  Substance and Sexual Activity   Alcohol use: No   Drug use: Yes    Types: Marijuana    Comment: quit - 30 days  ago from 7/30   Sexual activity: Yes    Birth control/protection: None  Other Topics Concern   Not on file  Social History Narrative   Not on file   Social Determinants of Health   Financial Resource Strain: Low Risk  (10/09/2022)   Received from [facility]   Overall Financial Resource Strain (CARDIA)    Difficulty of Paying Living Expenses: Not hard at all  Food Insecurity: No Food Insecurity (11/03/2022)   Hunger Vital Sign    Worried About Running Out of Food in the Last Year: Never true    Ran Out of Food in the Last Year: Never true  Transportation Needs: No Transportation Needs (05/28/2022)   PRAPARE - Administrator, Civil Service (Medical): No    Lack of Transportation (Non-Medical): No  Physical Activity: Inactive (10/09/2022)   Received from [facility]   Exercise Vital Sign    Days of Exercise per Week: 0 days    Minutes of Exercise per Session: 0 min  Stress: No Stress Concern Present (10/09/2022)   Received from [facility] of Occupational Health - Occupational Stress Questionnaire    Feeling of Stress : Not at all  Recent Concern: Stress - Stress Concern Present (10/03/2022)   [employer] of Occupational Health - Occupational Stress Questionnaire    Feeling of Stress : To some extent  Social Connections: Moderately Isolated (10/09/2022)   Received from [facility]   Social Connection and Isolation Panel [NHANES]    Frequency of Communication with Friends and Family: More than three times a week    Frequency of Social Gatherings with Friends and Family: Once a week    Attends Religious Services: Never    Database administrator or Organizations: No    Attends Banker Meetings: Never    Marital Status: Living with partner  Intimate Partner Violence: Not At Risk (07/27/2022)   Received from [facility]   Humiliation, Afraid, Rape, and Kick questionnaire    Fear of Current or  Ex-Partner: No    Emotionally Abused: No    Physically Abused: No    Sexually Abused: No    Subjective: Review of Systems  Constitutional:  Negative for chills and fever.  HENT:  Negative for congestion and hearing loss.   Eyes:  Negative for blurred vision and double vision.  Respiratory:  Negative for cough and shortness of breath.   Cardiovascular:  Negative for chest pain and palpitations.  Gastrointestinal:  Positive for abdominal pain and nausea. Negative for blood in stool, constipation, diarrhea, heartburn, melena and vomiting.       Change in bowels  Genitourinary:  Negative for dysuria and urgency.  Musculoskeletal:  Negative for joint  pain and myalgias.  Skin:  Negative for itching and rash.  Neurological:  Negative for dizziness and headaches.  Psychiatric/Behavioral:  Negative for depression. The patient is not nervous/anxious.        Objective: BP 99/65 (BP Location: Left Arm, Patient Position: Sitting, Cuff Size: Large)   Pulse 84   Temp (!) 97.5 F (36.4 C) (Temporal)   Ht 5\' 3"  (1.6 m)   Wt 254 lb 1.6 oz (115.3 kg)   LMP 03/07/2022 (Approximate)   BMI 45.01 kg/m  Physical Exam Constitutional:      Appearance: Normal appearance. She is obese.  HENT:     Head: Normocephalic and atraumatic.  Eyes:     Extraocular Movements: Extraocular movements intact.     Conjunctiva/sclera: Conjunctivae normal.  Cardiovascular:     Rate and Rhythm: Normal rate and regular rhythm.  Pulmonary:     Effort: Pulmonary effort is normal.     Breath sounds: Normal breath sounds.  Abdominal:     General: Bowel sounds are normal.     Palpations: Abdomen is soft.  Musculoskeletal:        General: No swelling. Normal range of motion.     Cervical back: Normal range of motion and neck supple.  Skin:    General: Skin is warm and dry.     Coloration: Skin is not jaundiced.  Neurological:     General: No focal deficit present.     Mental Status: She is alert and oriented to  person, place, and time.  Psychiatric:        Mood and Affect: Mood normal.        Behavior: Behavior normal.      Assessment: *Nausea and vomiting *Cannabinoid hyperemesis *Abdominal pain *Change in bowels *Rectal bleeding  Plan: Patient well-known to the GI service with history of nausea and vomiting the setting of chronic cannabinoid use.  Currently on metoclopramide and at her baseline.  Avoid marijuana.  In regards to her worsening abdominal pain, change in bowel habits, intermittent rectal bleeding, she will need colonoscopy to further evaluate.  However given her current pregnancy will need to delay for now unless needs to be evaluated on an emergent basis.  Patient to discuss timing with her OB.  We will see patient back in our office approximately 1 month after her due date to discuss scheduling this.    11/08/2022 3:42 PM   Disclaimer: This note was dictated with voice recognition software. Similar sounding words can inadvertently be transcribed and may not be corrected upon review.

## 2022-11-09 DIAGNOSIS — F331 Major depressive disorder, recurrent, moderate: Secondary | ICD-10-CM | POA: Diagnosis not present

## 2022-11-09 DIAGNOSIS — Z3689 Encounter for other specified antenatal screening: Secondary | ICD-10-CM | POA: Diagnosis not present

## 2022-11-09 DIAGNOSIS — Z3493 Encounter for supervision of normal pregnancy, unspecified, third trimester: Secondary | ICD-10-CM | POA: Diagnosis not present

## 2022-11-09 DIAGNOSIS — Z3685 Encounter for antenatal screening for Streptococcus B: Secondary | ICD-10-CM | POA: Diagnosis not present

## 2022-11-09 DIAGNOSIS — F411 Generalized anxiety disorder: Secondary | ICD-10-CM | POA: Diagnosis not present

## 2022-11-09 DIAGNOSIS — Z3A35 35 weeks gestation of pregnancy: Secondary | ICD-10-CM | POA: Diagnosis not present

## 2022-11-13 DIAGNOSIS — F411 Generalized anxiety disorder: Secondary | ICD-10-CM | POA: Diagnosis not present

## 2022-11-13 DIAGNOSIS — F331 Major depressive disorder, recurrent, moderate: Secondary | ICD-10-CM | POA: Diagnosis not present

## 2022-11-15 DIAGNOSIS — O99210 Obesity complicating pregnancy, unspecified trimester: Secondary | ICD-10-CM | POA: Diagnosis not present

## 2022-11-17 DIAGNOSIS — O26893 Other specified pregnancy related conditions, third trimester: Secondary | ICD-10-CM | POA: Diagnosis not present

## 2022-11-17 DIAGNOSIS — O212 Late vomiting of pregnancy: Secondary | ICD-10-CM | POA: Diagnosis not present

## 2022-11-17 DIAGNOSIS — R1011 Right upper quadrant pain: Secondary | ICD-10-CM | POA: Diagnosis not present

## 2022-11-17 DIAGNOSIS — O99891 Other specified diseases and conditions complicating pregnancy: Secondary | ICD-10-CM | POA: Diagnosis not present

## 2022-11-17 DIAGNOSIS — Z3689 Encounter for other specified antenatal screening: Secondary | ICD-10-CM | POA: Diagnosis not present

## 2022-11-17 DIAGNOSIS — R109 Unspecified abdominal pain: Secondary | ICD-10-CM | POA: Diagnosis not present

## 2022-11-17 DIAGNOSIS — O219 Vomiting of pregnancy, unspecified: Secondary | ICD-10-CM | POA: Diagnosis not present

## 2022-11-17 DIAGNOSIS — Z3A36 36 weeks gestation of pregnancy: Secondary | ICD-10-CM | POA: Diagnosis not present

## 2022-11-18 ENCOUNTER — Encounter: Payer: Self-pay | Admitting: Obstetrics and Gynecology

## 2022-11-18 ENCOUNTER — Observation Stay
Admission: EM | Admit: 2022-11-18 | Discharge: 2022-11-18 | Disposition: A | Discharge status: Home / Self Care | Payer: Medicaid Other | Attending: Obstetrics and Gynecology | Admitting: Obstetrics and Gynecology

## 2022-11-18 ENCOUNTER — Other Ambulatory Visit: Payer: Self-pay

## 2022-11-18 DIAGNOSIS — O26893 Other specified pregnancy related conditions, third trimester: Secondary | ICD-10-CM | POA: Diagnosis not present

## 2022-11-18 DIAGNOSIS — O99283 Endocrine, nutritional and metabolic diseases complicating pregnancy, third trimester: Secondary | ICD-10-CM | POA: Diagnosis not present

## 2022-11-18 DIAGNOSIS — Z3A36 36 weeks gestation of pregnancy: Secondary | ICD-10-CM | POA: Insufficient documentation

## 2022-11-18 DIAGNOSIS — R1013 Epigastric pain: Secondary | ICD-10-CM | POA: Diagnosis not present

## 2022-11-18 DIAGNOSIS — R1116 Cannabis hyperemesis syndrome: Secondary | ICD-10-CM | POA: Diagnosis present

## 2022-11-18 DIAGNOSIS — O99891 Other specified diseases and conditions complicating pregnancy: Secondary | ICD-10-CM | POA: Diagnosis not present

## 2022-11-18 DIAGNOSIS — E876 Hypokalemia: Secondary | ICD-10-CM | POA: Insufficient documentation

## 2022-11-18 DIAGNOSIS — R1010 Upper abdominal pain, unspecified: Secondary | ICD-10-CM | POA: Diagnosis not present

## 2022-11-18 DIAGNOSIS — O99323 Drug use complicating pregnancy, third trimester: Secondary | ICD-10-CM | POA: Diagnosis not present

## 2022-11-18 DIAGNOSIS — O218 Other vomiting complicating pregnancy: Secondary | ICD-10-CM | POA: Diagnosis not present

## 2022-11-18 DIAGNOSIS — R112 Nausea with vomiting, unspecified: Secondary | ICD-10-CM | POA: Diagnosis present

## 2022-11-18 HISTORY — DX: Gestational (pregnancy-induced) hypertension without significant proteinuria, unspecified trimester: O13.9

## 2022-11-18 LAB — COMPREHENSIVE METABOLIC PANEL
ALT: 17 U/L (ref 0–44)
AST: 24 U/L (ref 15–41)
Albumin: 3.2 g/dL — ABNORMAL LOW (ref 3.5–5.0)
Alkaline Phosphatase: 114 U/L (ref 38–126)
Anion gap: 12 (ref 5–15)
BUN: 6 mg/dL (ref 6–20)
CO2: 21 mmol/L — ABNORMAL LOW (ref 22–32)
Calcium: 8.9 mg/dL (ref 8.9–10.3)
Chloride: 101 mmol/L (ref 98–111)
Creatinine, Ser: 0.62 mg/dL (ref 0.44–1.00)
GFR, Estimated: >60 mL/min (ref >60)
Glucose, Bld: 93 mg/dL (ref 70–99)
Potassium: 2.9 mmol/L — ABNORMAL LOW (ref 3.5–5.1)
Sodium: 134 mmol/L — ABNORMAL LOW (ref 135–145)
Total Bilirubin: 1 mg/dL (ref 0.3–1.2)
Total Protein: 7 g/dL (ref 6.5–8.1)

## 2022-11-18 LAB — CBC
HCT: 35.8 % — ABNORMAL LOW (ref 36.0–46.0)
Hemoglobin: 11.7 g/dL — ABNORMAL LOW (ref 12.0–15.0)
MCH: 25.4 pg — ABNORMAL LOW (ref 26.0–34.0)
MCHC: 32.7 g/dL (ref 30.0–36.0)
MCV: 77.7 fL — ABNORMAL LOW (ref 80.0–100.0)
Platelets: 309 10*3/uL (ref 150–400)
RBC: 4.61 MIL/uL (ref 3.87–5.11)
RDW: 18.6 % — ABNORMAL HIGH (ref 11.5–15.5)
WBC: 12 10*3/uL — ABNORMAL HIGH (ref 4.0–10.5)
nRBC: 0 % (ref 0.0–0.2)

## 2022-11-18 MED ORDER — DICYCLOMINE HCL 10 MG/5ML PO SOLN
10.0000 mg | Freq: Once | ORAL | Status: AC
  Administered 2022-11-18: 10 mg via ORAL
  Filled 2022-11-18 (×2): qty 5

## 2022-11-18 MED ORDER — LACTATED RINGERS IV SOLN: INTRAVENOUS | Status: DC

## 2022-11-18 MED ORDER — PANTOPRAZOLE SODIUM 40 MG IV SOLR
40.0000 mg | Freq: Once | INTRAVENOUS | Status: AC
  Administered 2022-11-18: 40 mg via INTRAVENOUS
  Filled 2022-11-18 (×2): qty 10

## 2022-11-18 MED ORDER — CALCIUM CARBONATE ANTACID 500 MG PO CHEW: 2.0000 | CHEWABLE_TABLET | ORAL | Status: DC | PRN

## 2022-11-18 MED ORDER — HYDROXYZINE HCL 50 MG/ML IM SOLN
50.0000 mg | Freq: Once | INTRAMUSCULAR | Status: AC | PRN
  Administered 2022-11-18: 50 mg via INTRAMUSCULAR
  Filled 2022-11-18: qty 1

## 2022-11-18 MED ORDER — ACETAMINOPHEN 500 MG PO TABS: 1000.0000 mg | ORAL_TABLET | Freq: Four times a day (QID) | ORAL | Status: DC | PRN

## 2022-11-18 MED ORDER — PROMETHAZINE HCL 25 MG RE SUPP
25.0000 mg | Freq: Four times a day (QID) | RECTAL | Status: DC | PRN
  Administered 2022-11-18: 25 mg via RECTAL
  Filled 2022-11-18 (×2): qty 1

## 2022-11-18 MED ORDER — ALUM & MAG HYDROXIDE-SIMETH 200-200-20 MG/5ML PO SUSP
30.0000 mL | Freq: Once | ORAL | Status: AC
  Administered 2022-11-18: 30 mL via ORAL
  Filled 2022-11-18: qty 30

## 2022-11-18 MED ORDER — METOCLOPRAMIDE HCL 5 MG/ML IJ SOLN
10.0000 mg | Freq: Once | INTRAMUSCULAR | Status: AC
  Administered 2022-11-18: 10 mg via INTRAVENOUS
  Filled 2022-11-18: qty 2

## 2022-11-18 MED ORDER — ONDANSETRON 4 MG PO TBDP
8.0000 mg | ORAL_TABLET | Freq: Three times a day (TID) | ORAL | Status: DC | PRN
  Administered 2022-11-18: 8 mg via ORAL
  Filled 2022-11-18 (×2): qty 2

## 2022-11-18 MED ORDER — POTASSIUM CHLORIDE 10 MEQ/100ML IV SOLN
10.0000 meq | INTRAVENOUS | Status: AC
  Administered 2022-11-18 (×2): 10 meq via INTRAVENOUS
  Filled 2022-11-18 (×2): qty 100

## 2022-11-18 MED ORDER — LACTATED RINGERS IV SOLN
125.0000 mL/h | INTRAVENOUS | Status: AC
  Administered 2022-11-18: 125 mL/h via INTRAVENOUS

## 2022-11-18 NOTE — Progress Notes (Signed)
Discharge home. Left floor via wheelchair. Elaina Hoops

## 2022-11-18 NOTE — OB Triage Note (Signed)
Pt co of right sided abdominal pain x 2 day. Denies feeling any ctx. Few seen on monitor after applied. FHT 130. Reports n/v  x 2 days. States nothing makes it better ans "everything" makes it worse. Co of mid to lower back pain on right and left side. Pt reports abdominal pain as stabbing. Yells out at times with the pain. Pt has reported pain with a ctx x 1 but pt denies ctx. Asking for pain medication. Was seen at [facility] ed last night. Pt also states she has been having pink vaginal bleeding. Zoe Hunter

## 2022-11-18 NOTE — Discharge Summary (Signed)
Zoe Hunter is a 27 y.o. female. She is at [redacted]w[redacted]d gestation. Patient's last menstrual period was 03/07/2022 (approximate). 12/12/2022, Date entered prior to episode creation   Prenatal care site:  [facility]  Chief complaint: upper abdominal pain   Admission Diagnoses:  1) intrauterine pregnancy at [redacted]w[redacted]d  2) Epigastric pain [R10.13]  Discharge Diagnoses:  Principal Problem:   Epigastric pain Active Problems:   Abdominal pain in pregnancy, third trimester   Cannabinoid hyperemesis syndrome   Hypokalemia due to excessive gastrointestinal loss of potassium  HPI: Zoe Hunter presents to L&D with complaints of upper abdominal pain. She had N/V that had gotten worse over the past couple of days.  She reports severe pain in her upper abdomen that is intermittent, sharp, and stabbing.  The nausea and vomiting makes the pain worse.  She's had this pain frequently throughout her pregnancy but it's exacerbated when she has N/V episodes.  Thearsa has been evaluated multiple times in pregnancy in several ED's for N/V and upper abdominal pain.  She previously had ultrasounds and abdominal MRI that was overall reassuring. Most recent abdominal MRI was done 11/17/2022 and was normal.  She is well-known by [facility] and has also been evaluated by [facility]. Mardel was most recently seen at [facility] yesterday for her concerns. Labs and diagnostic tests were all appropriate for pregnancy.  Her pregnancy is complicated by cannabinoid induced hyperemesis, anemia in pregnancy, bipolar disordered with ADD and anxiety, and obesity .  She denies Loss of fluid or Vaginal bleeding. Denies headache or changes in vision. Endorses fetal movement as active.   Factors complicating pregnancy: Marijuana use in pregnancy  Hyperemesis gravidarum Anemia during pregnancy  Bipolar d/o/ADD/Anxiety  Obesity in pregnancy  S: Denies significant contractions, no VB.no LOF,  Active fetal movement.   Maternal Medical History:  Past Medical  Hx:  has a past medical history of ADHD, ADHD (attention deficit hyperactivity disorder), Anxiety, Bipolar disorder (HCC), Cholelithiasis (01/2015), Depression, GERD (gastroesophageal reflux disease), Pregnancy induced hypertension, Suicidal ideation (11/14/2018), and Transient loss of consciousness (08/17/2020).    Past Surgical Hx:  has a past surgical history that includes Tonsillectomy; Wisdom tooth extraction; Cholecystectomy (N/A, 02/16/2015); and Esophagogastroduodenoscopy (egd) with propofol (N/A, 05/05/2016).   No Known Allergies   Prior to Admission medications   Medication Sig Start Date End Date Taking? Authorizing Provider  famotidine (PEPCID) 40 MG/5ML suspension Take 10 mg by mouth daily.   Yes [provider]  hydrOXYzine (ATARAX) 25 MG tablet Take 25 mg by mouth 3 (three) times daily as needed for anxiety.   Yes [provider]  metoCLOPramide (REGLAN) 5 MG/5ML solution Take 5 mg by mouth 4 (four) times daily -  before meals and at bedtime.   Yes [provider]  ondansetron (ZOFRAN-ODT) 4 MG disintegrating tablet 4mg  ODT q4 hours prn nausea/vomit 06/09/22  Yes [name], MD  acetaminophen (TYLENOL) 500 MG tablet Take 2 tablets (1,000 mg total) by mouth every 6 (six) hours as needed (for pain scale < 4  OR  temperature  >/=  100.5 F). 09/27/22   Sonny Dandy, CNM    Social History: She  reports that she has never smoked. She has never used smokeless tobacco. She reports current drug use. Drug: Marijuana. She reports that she does not drink alcohol.  Family History: family history includes Cancer in her mother and another family member; Heart disease in her maternal grandfather; Hypertension in her maternal grandfather, maternal grandmother, and mother.  Review of Systems: A full review of systems was performed and negative except as noted in the HPI.     Pertinent Results:   O:  BP (!) 127/51 (BP Location: Right Arm)   Pulse (!)  56   Temp 98.2 F (36.8 C) (Oral)   Resp 16   Ht 5\' 3"  (1.6 m)   Wt 112 kg   LMP 03/07/2022 (Approximate)   BMI 43.75 kg/m  Results for orders placed or performed during the hospital encounter of 11/18/22 (from the past 48 hour(s))  Comprehensive metabolic panel   Collection Time: 11/18/22  9:19 AM  Result Value Ref Range   Sodium 134 (L) 135 - 145 mmol/L   Potassium 2.9 (L) 3.5 - 5.1 mmol/L   Chloride 101 98 - 111 mmol/L   CO2 21 (L) 22 - 32 mmol/L   Glucose, Bld 93 70 - 99 mg/dL   BUN 6 6 - 20 mg/dL   Creatinine, Ser 5.36 0.44 - 1.00 mg/dL   Calcium 8.9 8.9 - 64.4 mg/dL   Total Protein 7.0 6.5 - 8.1 g/dL   Albumin 3.2 (L) 3.5 - 5.0 g/dL   AST 24 15 - 41 U/L   ALT 17 0 - 44 U/L   Alkaline Phosphatase 114 38 - 126 U/L   Total Bilirubin 1.0 0.3 - 1.2 mg/dL   GFR, Estimated >03 >47 mL/min   Anion gap 12 5 - 15  CBC   Collection Time: 11/18/22  9:19 AM  Result Value Ref Range   WBC 12.0 (H) 4.0 - 10.5 K/uL   RBC 4.61 3.87 - 5.11 MIL/uL   Hemoglobin 11.7 (L) 12.0 - 15.0 g/dL   HCT 42.5 (L) 95.6 - 38.7 %   MCV 77.7 (L) 80.0 - 100.0 fL   MCH 25.4 (L) 26.0 - 34.0 pg   MCHC 32.7 30.0 - 36.0 g/dL   RDW 56.4 (H) 33.2 - 95.1 %   Platelets 309 150 - 400 K/uL   nRBC 0.0 0.0 - 0.2 %     Constitutional: NAD, AAOx3  PULM: nl respiratory effort Abd: gravid, generally tender to palpation in upper abdomen, unable to localize, uterus is soft to palpation, no uterine tenderness  Ext: Non-tender, Nonedmeatous Psych: mood appropriate, speech normal SVE: Dilation: 2 Effacement (%): 60 Cervical Position: Anterior Station: -1, -2 Presentation: Vertex Exam by:: LSE    NST: Baseline FHR: 130 beats/min Variability: moderate Accelerations: present Decelerations: absent Tocometry: Irregular, mild contractions  Time: at least 20 minutes   Interpretation: Category I INDICATIONS: rule out uterine contractions RESULTS:  A NST procedure was performed with FHR monitoring and a normal  baseline established, appropriate time of 20-40 minutes of evaluation, and accels >2 seen w 15x15 characteristics.  Results show a REACTIVE NST.   Consults: None  Procedures: NST, IV fluids   Hospital Course: The patient was admitted to Labor and Delivery Triage for observation. She was treated with IV fluids, IV antiemetics, GI cocktail, and IM hydroxyzine. Her nausea and pain decreased after receiving IV Reglan, Protonix, and IM vistaril.  She was given IV potassium to correct hypokalemia.  Damiana was unable to provide a urine sample for urine PCR.  She was hydrated with IV fluids but did not provide a sample for evaluation.  Urine PCR was ordered d/t epigastric pain but cancelled when she did not provide a sample. Blood pressures remained normal and LFT's were normal.  She was instructed to take scheduled medications (previously prescribed by [facility]) even if  her symptoms have improved.  Plan for follow up with primary OB/GYN.  She was deemed stable for discharge and further outpatient follow up.   Plan: 1) Reactive NST  -Category 1 tracing  -Reassuring fetal status   2) Epigastric pain  -Reviewed regimen to help decrease frequency of symptoms.  Recommend taking scheduled meds even if she is feeling better.  -Discussed warning signs to return to L&D triage with  -Reviewed comfort measures   Discharge Condition: stable  Disposition:  Discharge disposition: 01-Home or Self Care        Allergies as of 11/18/2022   No Known Allergies      Medication List     TAKE these medications    acetaminophen 500 MG tablet Commonly known as: TYLENOL Take 2 tablets (1,000 mg total) by mouth every 6 (six) hours as needed (for pain scale < 4  OR  temperature  >/=  100.5 F).   cyclobenzaprine 10 MG tablet Commonly known as: FLEXERIL Take 10 mg by mouth 3 (three) times daily as needed for muscle spasms.   dicyclomine 10 MG capsule Commonly known as: BENTYL Take 10 mg by mouth every  morning.   famotidine 40 MG/5ML suspension Commonly known as: PEPCID Take 10 mg by mouth daily.   hydrOXYzine 25 MG capsule Commonly known as: VISTARIL Take 50 mg by mouth every 8 (eight) hours as needed for vomiting or anxiety.   metoCLOPramide 5 MG/5ML solution Commonly known as: REGLAN Take 5 mg by mouth 4 (four) times daily -  before meals and at bedtime.   ondansetron 4 MG disintegrating tablet Commonly known as: ZOFRAN-ODT 4mg  ODT q4 hours prn nausea/vomit   promethazine 12.5 MG suppository Commonly known as: PHENERGAN Place 25 mg rectally every 6 (six) hours as needed for nausea or vomiting.   sertraline 50 MG tablet Commonly known as: ZOLOFT Take 50 mg by mouth at bedtime.        Follow-up Information     [facility]. Call in 2 day(s).   Why: follow up appointment               ----- Margaretmary Eddy, CNM Certified Nurse Midwife [facility]

## 2022-11-20 DIAGNOSIS — F411 Generalized anxiety disorder: Secondary | ICD-10-CM | POA: Diagnosis not present

## 2022-11-20 DIAGNOSIS — F331 Major depressive disorder, recurrent, moderate: Secondary | ICD-10-CM | POA: Diagnosis not present

## 2022-11-20 DIAGNOSIS — F902 Attention-deficit hyperactivity disorder, combined type: Secondary | ICD-10-CM | POA: Diagnosis not present

## 2022-11-22 DIAGNOSIS — O99213 Obesity complicating pregnancy, third trimester: Secondary | ICD-10-CM | POA: Diagnosis not present

## 2022-11-22 DIAGNOSIS — Z3A37 37 weeks gestation of pregnancy: Secondary | ICD-10-CM | POA: Diagnosis not present

## 2022-11-28 DIAGNOSIS — Z419 Encounter for procedure for purposes other than remedying health state, unspecified: Secondary | ICD-10-CM | POA: Diagnosis not present

## 2022-11-28 DIAGNOSIS — O99213 Obesity complicating pregnancy, third trimester: Secondary | ICD-10-CM | POA: Diagnosis not present

## 2022-12-01 ENCOUNTER — Other Ambulatory Visit: Payer: Medicaid Other | Admitting: Obstetrics and Gynecology

## 2022-12-01 NOTE — Patient Outreach (Signed)
Care Coordination  12/01/2022  Zoe Hunter Mar 02, 1995 [id number]  RNCM called patient at scheduled time.  Patient answered and stated she was at a doctor appointment for her son and asked to  be called another time.  RNCM rescheduled appointment at patient's request.  Kathi Der RN, BSN [place]  [facility] Care Management Coordinator - Managed [place] High Risk [phone]

## 2022-12-05 DIAGNOSIS — D62 Acute posthemorrhagic anemia: Secondary | ICD-10-CM | POA: Diagnosis not present

## 2022-12-05 DIAGNOSIS — Z3A39 39 weeks gestation of pregnancy: Secondary | ICD-10-CM | POA: Diagnosis not present

## 2022-12-05 DIAGNOSIS — O9081 Anemia of the puerperium: Secondary | ICD-10-CM | POA: Diagnosis not present

## 2022-12-05 DIAGNOSIS — O99214 Obesity complicating childbirth: Secondary | ICD-10-CM | POA: Diagnosis not present

## 2022-12-05 DIAGNOSIS — O471 False labor at or after 37 completed weeks of gestation: Secondary | ICD-10-CM | POA: Diagnosis not present

## 2022-12-06 DIAGNOSIS — Z3A39 39 weeks gestation of pregnancy: Secondary | ICD-10-CM | POA: Diagnosis not present

## 2022-12-06 DIAGNOSIS — O99814 Abnormal glucose complicating childbirth: Secondary | ICD-10-CM | POA: Diagnosis not present

## 2022-12-22 DIAGNOSIS — Z3A29 29 weeks gestation of pregnancy: Secondary | ICD-10-CM | POA: Diagnosis not present

## 2022-12-22 DIAGNOSIS — O99013 Anemia complicating pregnancy, third trimester: Secondary | ICD-10-CM | POA: Diagnosis not present

## 2022-12-25 DIAGNOSIS — F411 Generalized anxiety disorder: Secondary | ICD-10-CM | POA: Diagnosis not present

## 2022-12-25 DIAGNOSIS — F331 Major depressive disorder, recurrent, moderate: Secondary | ICD-10-CM | POA: Diagnosis not present

## 2022-12-28 IMAGING — MR MR HEAD WO/W CM
13 series · 48 of 48 positions shown · IV contrast (10 mL Gadavist)
Comparison: None.

CLINICAL DATA: Brynjar Orri of altered consciousness.

EXAM:
MRI HEAD WITHOUT AND WITH CONTRAST
TECHNIQUE: Multiplanar, multiecho pulse sequences of the brain and surrounding
structures were obtained without and with intravenous contrast.
CONTRAST:  10mL GADAVIST GADOBUTROL 1 MMOL/ML IV SOLN

[Series 2: DWI · axial · 3.0mm · 1.20mm/px · z∈[-39,+123]mm · 6 of 110 slices shown (1 of 4)]
[im 1/110]
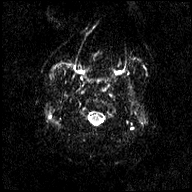
[im 22/110]
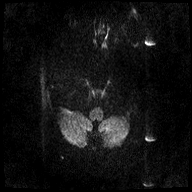
[im 44/110]
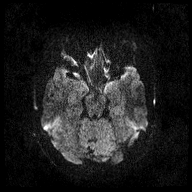
[im 66/110]
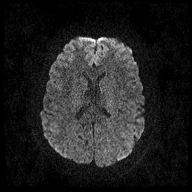
[im 88/110]
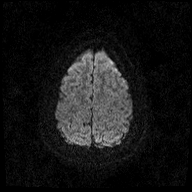
[im 110/110]
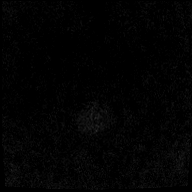

[Series 3: DWI · axial · 3.0mm · 1.20mm/px · z∈[-39,+123]mm · 3 of 55 slices shown (2 of 4)]
[im 1/55]
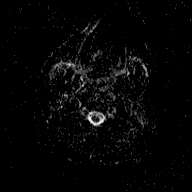
[im 28/55]
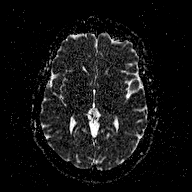
[im 55/55]
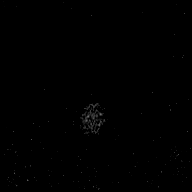

[Series 4: DWI · coronal · 3.0mm · 1.15mm/px · 5 of 98 slices shown (3 of 4)]
[im 1/98]
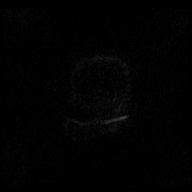
[im 25/98]
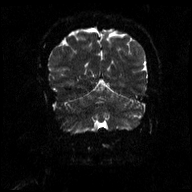
[im 49/98]
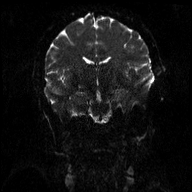
[im 73/98]
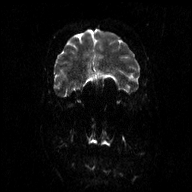
[im 98/98]
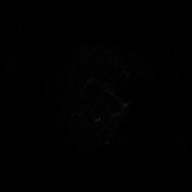

[Series 5: DWI · coronal · 3.0mm · 1.15mm/px · 3 of 49 slices shown (4 of 4)]
[im 1/49]
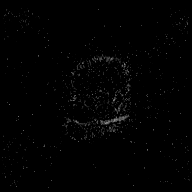
[im 25/49]
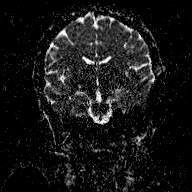
[im 49/49]
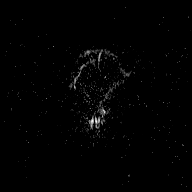

[Series 6: T1 · sagittal · 5.0mm · 0.45mm/px · 1 of 25 slices shown (1 of 2)]
[im 1/25]
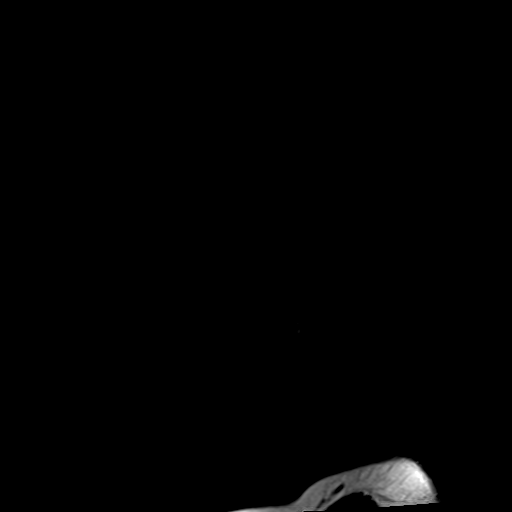

[Series 7: T2 · axial · 5.0mm · 0.72mm/px · 1 of 23 slices shown (1 of 3)]
[im 1/23]
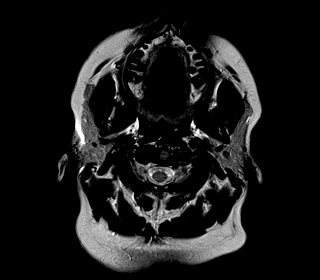

[Series 8: FLAIR · axial · 3.0mm · 0.45mm/px · z∈[-40,+121]mm · 3 of 55 slices shown (1 of 2)]
[im 1/55]
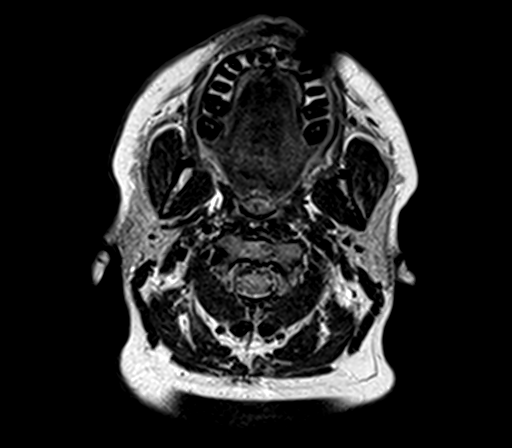
[im 28/55]
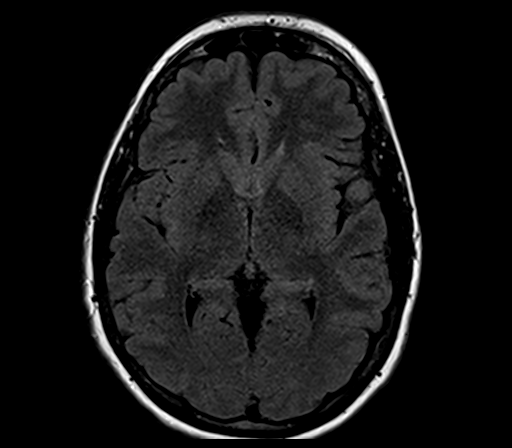
[im 55/55]
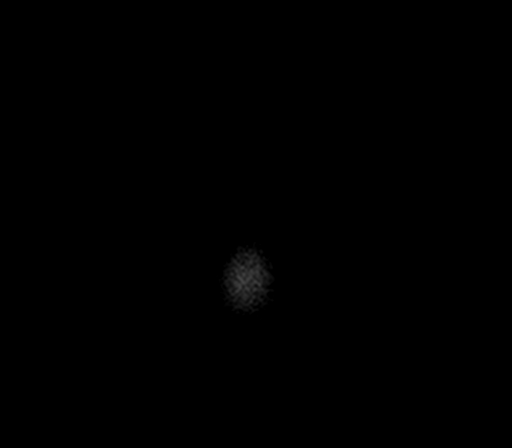

[Series 9: T2 · axial · 5.0mm · 0.72mm/px · 1 of 23 slices shown (2 of 3)]
[im 1/23]
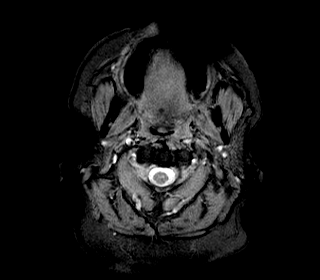

[Series 10: T1 · axial · 1.0mm · 1.00mm/px · z∈[-39,+120]mm · 9 of 160 slices shown (2 of 2)]
[im 1/160]
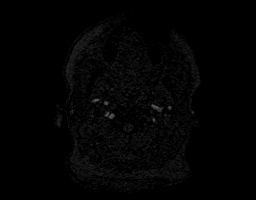
[im 20/160]
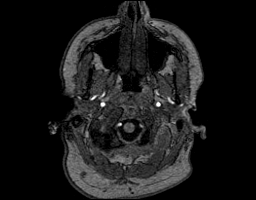
[im 40/160]
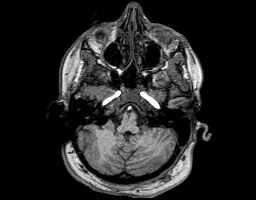
[im 60/160]
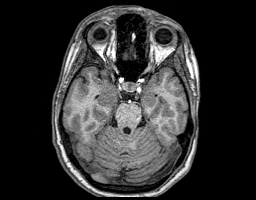
[im 80/160]
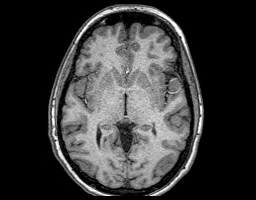
[im 100/160]
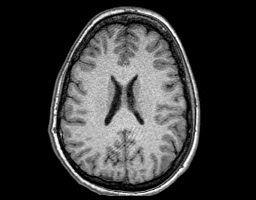
[im 120/160]
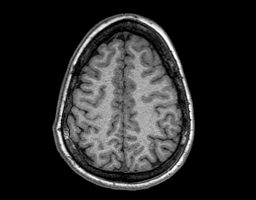
[im 140/160]
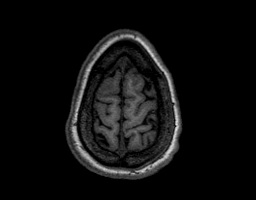
[im 160/160]
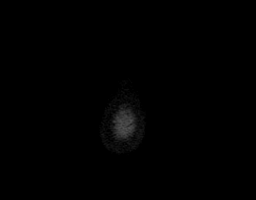

[Series 11: FLAIR · coronal · 3.0mm · 0.45mm/px · 3 of 50 slices shown (2 of 2)]
[im 1/50]
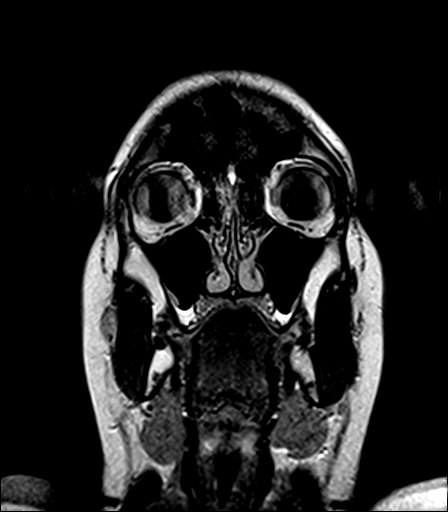
[im 25/50]
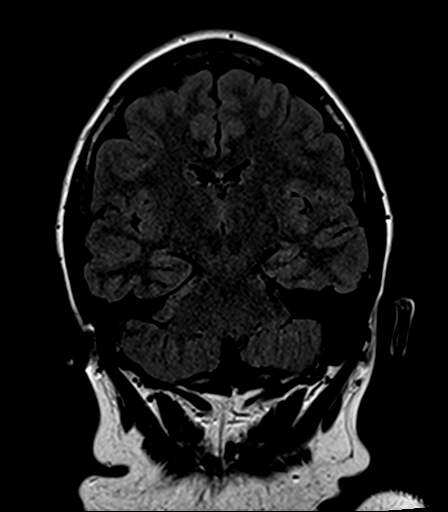
[im 50/50]
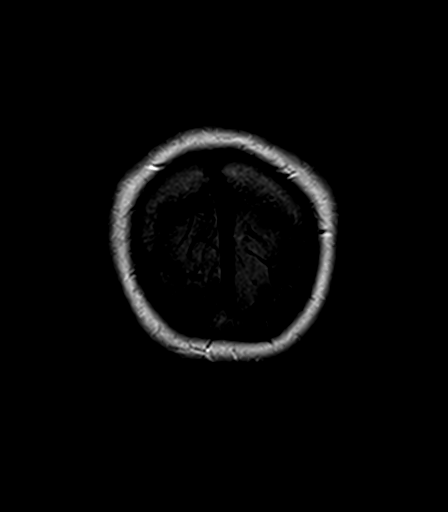

[Series 12: T2 · coronal · 3.0mm · 0.47mm/px · 2 of 33 slices shown (3 of 3)]
[im 1/33]
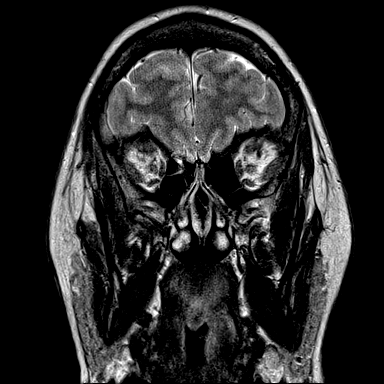
[im 33/33]
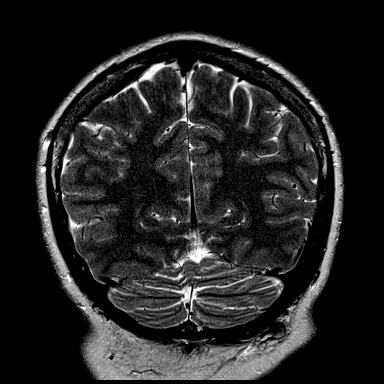

[Series 13: T1 post-contrast · axial · 1.0mm · 1.00mm/px · z∈[-39,+120]mm · 9 of 160 slices shown (1 of 2)]
[im 1/160]
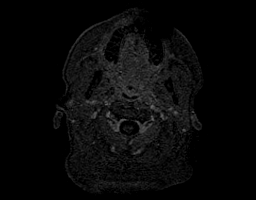
[im 20/160]
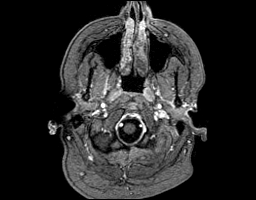
[im 40/160]
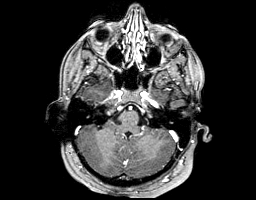
[im 60/160]
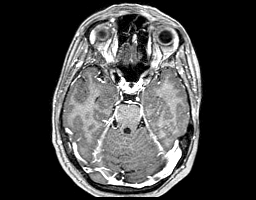
[im 80/160]
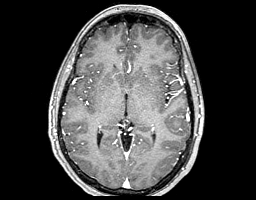
[im 100/160]
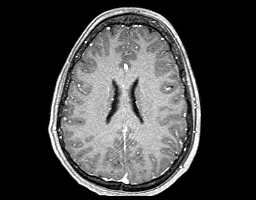
[im 120/160]
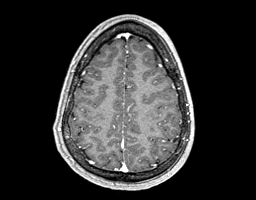
[im 140/160]
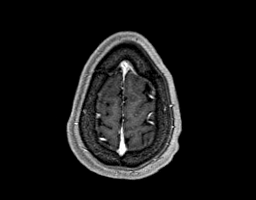
[im 160/160]
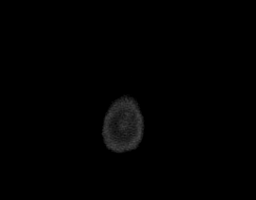

[Series 15: T1 post-contrast · coronal · 5.0mm · 0.43mm/px · 2 of 31 slices shown (2 of 2)]
[im 1/31]
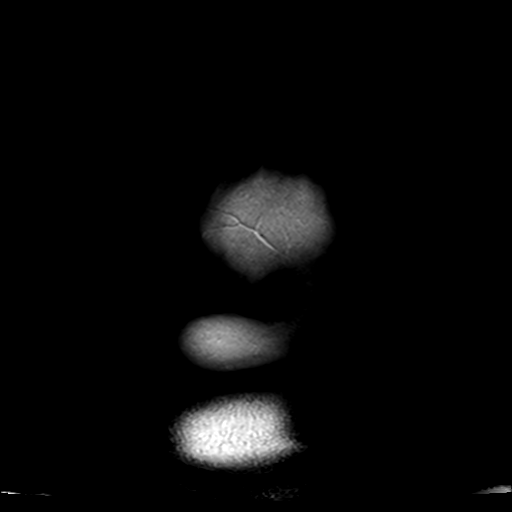
[im 31/31]
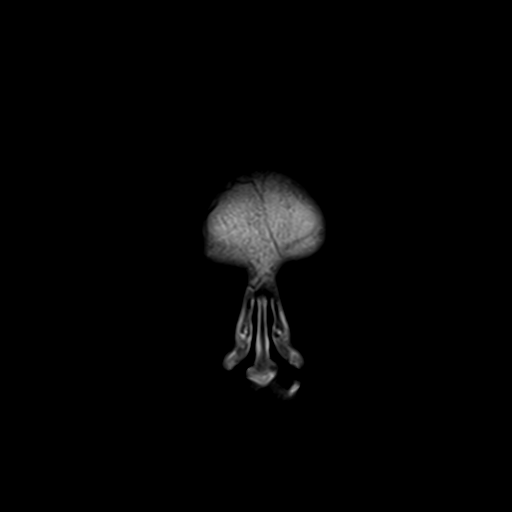

[48 of 48 positions shown; findings below may reference images not displayed]

FINDINGS: Brain: No acute infarct, hemorrhage, or mass lesion is present. No
significant white matter lesions are present. The ventricles are of
normal size. No significant extraaxial fluid collection is present.

The internal auditory canals are within normal limits. The brainstem
and cerebellum are within normal limits.

Postcontrast images demonstrate no pathologic enhancement.

Vascular: Flow is present in the major intracranial arteries.

Skull and upper cervical spine: The craniocervical junction is
normal. Upper cervical spine is within normal limits. Marrow signal
is unremarkable.

Sinuses/Orbits: Mild mucosal thickening is present within the
anterior ethmoid air cells. There is minimal mucosal thickening in
the maxillary sinuses bilaterally. The paranasal sinuses and mastoid
air cells are otherwise clear. The globes and orbits are within
normal limits.
IMPRESSION: 1. Normal MRI appearance of the brain. No acute or focal lesion to
explain the patient's symptoms.
2. Mild anterior ethmoid and maxillary sinus disease.

## 2022-12-29 ENCOUNTER — Other Ambulatory Visit: Payer: Self-pay | Admitting: Obstetrics and Gynecology

## 2022-12-29 DIAGNOSIS — Z862 Personal history of diseases of the blood and blood-forming organs and certain disorders involving the immune mechanism: Secondary | ICD-10-CM | POA: Diagnosis not present

## 2022-12-29 DIAGNOSIS — O99013 Anemia complicating pregnancy, third trimester: Secondary | ICD-10-CM | POA: Diagnosis not present

## 2022-12-29 DIAGNOSIS — Z419 Encounter for procedure for purposes other than remedying health state, unspecified: Secondary | ICD-10-CM | POA: Diagnosis not present

## 2022-12-29 NOTE — Patient Instructions (Signed)
Hi Ms. Fuchs, I hope you are doing okay, sorry I missed you today-I will call back- as a part of your Medicaid benefit, you are eligible for care management and care coordination services at no cost or copay. I was unable to reach you by phone today but would be happy to help you with your health related needs. Please feel free to call me at [phone].  A member of the Managed Medicaid care management team will reach out to you again over the next 30 business days.   Kathi Der RN, BSN [place]  [facility] - Managed Medicaid High Risk [phone]

## 2022-12-29 NOTE — Patient Outreach (Signed)
  Medicaid Managed Care   Unsuccessful Attempt Note   12/29/2022 Name: Zoe Hunter MRN: [id number] DOB: 22-Dec-1995  Referred by: Ernestina Penna, MD Reason for referral : High Risk Managed Medicaid (Unsuccessful telephone outreach)  An unsuccessful telephone outreach was attempted today. The patient was referred to the case management team for assistance with care management and care coordination.    Follow Up Plan: The Managed Medicaid care management team will reach out to the patient again over the next 30 business  days. and The  Patient has been provided with contact information for the Managed Medicaid care management team and has been advised to call with any health related questions or concerns.   Kathi Der RN, BSN [place]  [facility] - Managed Medicaid High Risk [phone]

## 2023-01-09 ENCOUNTER — Ambulatory Visit: Payer: Medicaid Other | Admitting: Gastroenterology

## 2023-01-11 ENCOUNTER — Telehealth: Payer: Self-pay | Admitting: *Deleted

## 2023-01-11 ENCOUNTER — Telehealth: Payer: Medicaid Other | Admitting: Gastroenterology

## 2023-01-11 ENCOUNTER — Encounter: Payer: Self-pay | Admitting: *Deleted

## 2023-01-11 ENCOUNTER — Other Ambulatory Visit: Payer: Self-pay | Admitting: *Deleted

## 2023-01-11 ENCOUNTER — Encounter: Payer: Self-pay | Admitting: Gastroenterology

## 2023-01-11 VITALS — Ht 63.0 in | Wt 231.0 lb

## 2023-01-11 DIAGNOSIS — R194 Change in bowel habit: Secondary | ICD-10-CM | POA: Diagnosis not present

## 2023-01-11 DIAGNOSIS — K625 Hemorrhage of anus and rectum: Secondary | ICD-10-CM | POA: Diagnosis not present

## 2023-01-11 MED ORDER — PEG 3350-KCL-NA BICARB-NACL 420 G PO SOLR: 4000.0000 mL | Freq: Once | ORAL | 0 refills | Status: AC

## 2023-01-11 NOTE — Patient Instructions (Signed)
We are arranging a colonoscopy with Dr. Marletta Lor in the near future!  Please call if symptoms worsen at all.  Further recommendations to follow!  It was a pleasure to see you today. I want to create trusting relationships with patients and provide genuine, compassionate, and quality care. I truly value your feedback, so please be on the lookout for a survey regarding your visit with me today. I appreciate your time in completing this!         Gelene Mink, PhD, ANP-BC [facility]

## 2023-01-11 NOTE — Progress Notes (Signed)
Primary Care Physician:  Ernestina Penna, MD  Primary GI: Dr. Marletta Lor  Patient Location: Home   Provider Location: [facility] office   Reason for Visit: Follow-up   Persons present on the virtual encounter, with roles: patient and NP   Total time (minutes) spent on medical discussion: 10 minutes   Due to COVID-19, visit was conducted using virtual method.  Visit was requested by patient.  Virtual Visit via MyChart Video Note Due to COVID-19, visit is conducted virtually and was requested by patient.   I connected with Zoe Hunter on 01/11/23 at 10:30 AM EST by video and verified that I am speaking with the correct person using two identifiers.   I discussed the limitations, risks, security and privacy concerns of performing an evaluation and management service by video and the availability of in person appointments. I also discussed with the patient that there may be a patient responsible charge related to this service. The patient expressed understanding and agreed to proceed.  Chief Complaint  Patient presents with   Follow-up    Still having painful bm's, not solid     History of Present Illness:  27 year old female initially establishing care in Sept 2024 and seen by Dr Marletta Lor but was pregnant at that time. She has been seen by [facility] prior with history of chronic marijuana use and N/V, documented delayed gastric emptying. Due to rectal bleeding, recommending colonoscopy but needed to wait till post-partum.  Prior to [facility] and after has lower abdominal pain like a sharp, shooting pain. Hx of rectal bleeding. BM at least once a day. Not solid. Still liquid.   Symptoms present prior to pregnancy but noticed more pronounced during pregnancy and continues.   Dicyclomine helped in first pregnancy but not this time. Not on Reglan or Zofran. No nausea/vomiting.    One month old baby boy.   Past Medical History:  Diagnosis Date   ADHD    ADHD (attention deficit hyperactivity  disorder)    Anxiety    Bipolar disorder (HCC)    Cholelithiasis 01/2015   Depression    GERD (gastroesophageal reflux disease)    Pregnancy induced hypertension    Suicidal ideation 11/14/2018   Transient loss of consciousness 08/17/2020     Past Surgical History:  Procedure Laterality Date   CHOLECYSTECTOMY N/A 02/16/2015   Procedure: LAPAROSCOPIC CHOLECYSTECTOMY;  Surgeon: Natale Lay, MD;  Location: [facility] ORS;  Service: General;  Laterality: N/A;   ESOPHAGOGASTRODUODENOSCOPY (EGD) WITH PROPOFOL N/A 05/05/2016   Procedure: ESOPHAGOGASTRODUODENOSCOPY (EGD) WITH PROPOFOL;  Surgeon: Christena Deem, MD;  Location: [facility] ENDOSCOPY;  Service: Endoscopy;  Laterality: N/A;   TONSILLECTOMY     WISDOM TOOTH EXTRACTION       Current Meds  Medication Sig   acetaminophen (TYLENOL) 500 MG tablet Take 2 tablets (1,000 mg total) by mouth every 6 (six) hours as needed (for pain scale < 4  OR  temperature  >/=  100.5 F).     Family History  Problem Relation Age of Onset   Hypertension Mother    Cancer Mother    Hypertension Maternal Grandmother    Cancer Other    Hypertension Maternal Grandfather    Heart disease Maternal Grandfather     Social History   Socioeconomic History   Marital status: Significant Other    Spouse name: [name]   Number of children: Not on file   Years of education: Not on file   Highest education level: Not on file  Occupational History   Not on file  Tobacco Use   Smoking status: Never   Smokeless tobacco: Never   Tobacco comments:    boyfriend smokes  Vaping Use   Vaping status: Every Day  Substance and Sexual Activity   Alcohol use: No   Drug use: Not Currently    Types: Marijuana    Comment: quit - 30 days ago from 7/30   Sexual activity: Yes    Comment: undecided  Other Topics Concern   Not on file  Social History Narrative   Not on file   Social Determinants of Health   Financial Resource Strain: Low Risk  (12/05/2022)   Received from  [facility]   Overall Financial Resource Strain (CARDIA)    Difficulty of Paying Living Expenses: Not hard at all  Food Insecurity: No Food Insecurity (12/05/2022)   Received from [facility]   Hunger Vital Sign    Worried About Running Out of Food in the Last Year: Never true    Ran Out of Food in the Last Year: Never true  Transportation Needs: No Transportation Needs (12/05/2022)   Received from [facility] - Transportation    Lack of Transportation (Medical): No    Lack of Transportation (Non-Medical): No  Physical Activity: Inactive (10/09/2022)   Received from [facility]   Exercise Vital Sign    Days of Exercise per Week: 0 days    Minutes of Exercise per Session: 0 min  Stress: No Stress Concern Present (10/09/2022)   Received from [facility] of Occupational Health - Occupational Stress Questionnaire    Feeling of Stress : Not at all  Recent Concern: Stress - Stress Concern Present (10/03/2022)   [employer] of Occupational Health - Occupational Stress Questionnaire    Feeling of Stress : To some extent  Social Connections: Moderately Isolated (10/09/2022)   Received from [facility]   Social Connection and Isolation Panel [NHANES]    Frequency of Communication with Friends and Family: More than three times a week    Frequency of Social Gatherings with Friends and Family: Once a week    Attends Religious Services: Never    Database administrator or Organizations: No    Attends Engineer, structural: Never    Marital Status: Living with partner       Review of Systems: Gen: Denies fever, chills, anorexia. Denies fatigue, weakness, weight loss.  CV: Denies chest pain, palpitations, syncope, peripheral edema, and claudication. Resp: Denies dyspnea at rest, cough, wheezing, coughing up blood, and pleurisy. GI: see HPI Derm: Denies rash, itching, dry skin Psych: Denies depression, anxiety, memory loss,  confusion. No homicidal or suicidal ideation.  Heme: Denies bruising, bleeding, and enlarged lymph nodes.  Observations/Objective: No distress. Unable to perform physical exam due to video encounter.   Assessment and Plan:  27 year old female with history of intermittent rectal bleeding, now post-partum and appropriate for diagnostic colonoscopy.   Proceed with colonoscopy by Dr. Marletta Lor  in near future: the risks, benefits, and alternatives have been discussed with the patient in detail. The patient states understanding and desires to proceed.   Follow-up thereafter and further rec's to follow after colonoscopy  Follow Up Instructions:    I discussed the assessment and treatment plan with the patient. The patient was provided an opportunity to ask questions and all were answered. The patient agreed with the plan and demonstrated an understanding  of the instructions.   The patient was advised to call back or seek an in-person evaluation if the symptoms worsen or if the condition fails to improve as anticipated.  I provided 10 minutes of face-to-face time during this MyChart Video encounter.  Gelene Mink, PhD, ANP-BC [facility]

## 2023-01-11 NOTE — Telephone Encounter (Signed)
LMTRC   TCS ASA 2 w/Dr.Carver, UPT

## 2023-01-15 DIAGNOSIS — Z20822 Contact with and (suspected) exposure to covid-19: Secondary | ICD-10-CM | POA: Diagnosis not present

## 2023-01-15 DIAGNOSIS — R051 Acute cough: Secondary | ICD-10-CM | POA: Diagnosis not present

## 2023-01-15 DIAGNOSIS — N61 Mastitis without abscess: Secondary | ICD-10-CM | POA: Diagnosis not present

## 2023-01-15 DIAGNOSIS — R519 Headache, unspecified: Secondary | ICD-10-CM | POA: Diagnosis not present

## 2023-01-16 ENCOUNTER — Other Ambulatory Visit: Payer: Self-pay | Admitting: Obstetrics and Gynecology

## 2023-01-16 DIAGNOSIS — R051 Acute cough: Secondary | ICD-10-CM | POA: Diagnosis not present

## 2023-01-16 DIAGNOSIS — R519 Headache, unspecified: Secondary | ICD-10-CM | POA: Diagnosis not present

## 2023-01-16 DIAGNOSIS — Z20822 Contact with and (suspected) exposure to covid-19: Secondary | ICD-10-CM | POA: Diagnosis not present

## 2023-01-16 DIAGNOSIS — N61 Mastitis without abscess: Secondary | ICD-10-CM | POA: Diagnosis not present

## 2023-01-16 DIAGNOSIS — F331 Major depressive disorder, recurrent, moderate: Secondary | ICD-10-CM | POA: Diagnosis not present

## 2023-01-16 NOTE — Patient Instructions (Signed)
Hi Zoe Hunter, sorry to miss you again today, I hope things are okay as a part of your Medicaid benefit, you are eligible for care management and care coordination services at no cost or copay. I was unable to reach you by phone today but would be happy to help you with your health related needs. Please feel free to call me at [phone].  A member of the Managed Medicaid care management team will reach out to you again over the next 30 business days.   Kathi Der RN, BSN [place]  [employer], production - Managed Medicaid High Risk [phone]

## 2023-01-16 NOTE — Patient Outreach (Signed)
  Medicaid Managed Care   Unsuccessful Attempt Note   01/16/2023 Name: Zoe Hunter MRN: [id number] DOB: [birth year]  Referred by: Ernestina Penna, MD Reason for referral : High Risk Managed Medicaid (Case management follow up)  A second unsuccessful telephone outreach was attempted today. The patient was referred to the case management team for assistance with care management and care coordination.    Follow Up Plan: The Managed Medicaid care management team will reach out to the patient again over the next 30 business  days. and The  Patient has been provided with contact information for the Managed Medicaid care management team and has been advised to call with any health related questions or concerns.   Kathi Der RN, BSN [place]  [employer] production - Managed Medicaid High Risk [phone]

## 2023-01-18 DIAGNOSIS — R053 Chronic cough: Secondary | ICD-10-CM | POA: Diagnosis not present

## 2023-01-22 ENCOUNTER — Telehealth: Payer: Self-pay

## 2023-01-22 DIAGNOSIS — F331 Major depressive disorder, recurrent, moderate: Secondary | ICD-10-CM | POA: Diagnosis not present

## 2023-01-22 NOTE — Telephone Encounter (Signed)
..   Medicaid Managed Care   Unsuccessful Outreach Note  01/22/2023 Name: Zoe Hunter MRN: [id number] DOB: 06-Jan-1996  Referred by: Ernestina Penna, MD Reason for referral : Appointment (I called the patient today to get her missed phone appt with the MM RNCM rescheduled. She did not answer.I left a message on her VM.)   Third unsuccessful telephone outreach was attempted today. The patient was referred to the case management team for assistance with care management and care coordination. The patient's primary care provider has been notified of our unsuccessful attempts to make or maintain contact with the patient. The care management team is pleased to engage with this patient at any time in the future should he/she be interested in assistance from the care management team.   Follow Up Plan: We have been unable to make contact with the patient for follow up. The care management team is available to follow up with the patient after provider conversation with the patient regarding recommendation for care management engagement and subsequent re-referral to the care management team.   Weston Settle Care Guide  [facility] Managed  Care Guide [facility]  [phone]

## 2023-01-28 DIAGNOSIS — Z419 Encounter for procedure for purposes other than remedying health state, unspecified: Secondary | ICD-10-CM | POA: Diagnosis not present

## 2023-01-29 DIAGNOSIS — R87615 Unsatisfactory cytologic smear of cervix: Secondary | ICD-10-CM | POA: Diagnosis not present

## 2023-01-30 ENCOUNTER — Other Ambulatory Visit: Payer: Self-pay | Admitting: Obstetrics and Gynecology

## 2023-01-30 NOTE — Patient Instructions (Signed)
Hi Zoe Hunter, I am sorry we have been unable to reach you   - as a part of your Medicaid benefit, you are eligible for care management and care coordination services at no cost or copay. We have been unable to reach you by phone  but would be happy to help you with your health related needs. Please feel free to call me at [phone].  Kathi Der RN, BSN [place]  [employer], production - Managed Medicaid High Risk [phone]

## 2023-01-30 NOTE — Patient Outreach (Cosign Needed)
Care Coordination  01/30/2023  Zoe Hunter 14-Feb-1996 [id number]   Medicaid Managed Care   Unsuccessful Outreach Note  01/30/2023 Name: Zoe Hunter MRN: [id number] DOB: 06-Dec-1995  Referred by: Ernestina Penna, MD Reason for referral : High Risk Managed Medicaid (documenting 3 unsuccessful outreaches)  Third unsuccessful telephone outreach was attempted.  The patient was referred to the case management team for assistance with care management and care coordination. The patient's primary care provider has been notified of our unsuccessful attempts to make or maintain contact with the patient. The care management team is pleased to engage with this patient at any time in the future should he/she be interested in assistance from the care management team.   Follow Up Plan: The patient has been provided with contact information for the care management team and has been advised to call with any health related questions or concerns.  We have been unable to make contact with the patient for follow up. The care management team is available to follow up with the patient after provider conversation with the patient regarding recommendation for care management engagement and subsequent re-referral to the care management team.   Kathi Der RN, BSN [place]  [facility] Care Management Coordinator - Managed [place] High Risk [phone]

## 2023-02-06 NOTE — Telephone Encounter (Signed)
Pt left vm concerned about drinking the prep for colonoscopy and only doing clear liquids and breastfeeding.  Advised pt that spoke to provider. She advised to drink a lot of clear liquids to help milk supply and to also pump. Pt verbalized understanding.

## 2023-02-08 DIAGNOSIS — F411 Generalized anxiety disorder: Secondary | ICD-10-CM | POA: Diagnosis not present

## 2023-02-08 DIAGNOSIS — F331 Major depressive disorder, recurrent, moderate: Secondary | ICD-10-CM | POA: Diagnosis not present

## 2023-02-12 DIAGNOSIS — F411 Generalized anxiety disorder: Secondary | ICD-10-CM | POA: Diagnosis not present

## 2023-02-14 ENCOUNTER — Encounter (HOSPITAL_COMMUNITY)
Admission: RE | Admit: 2023-02-14 | Discharge: 2023-02-14 | Disposition: A | Discharge status: Home / Self Care | Payer: Medicaid Other | Source: Ambulatory Visit | Attending: Internal Medicine | Admitting: Internal Medicine

## 2023-02-14 DIAGNOSIS — R194 Change in bowel habit: Secondary | ICD-10-CM

## 2023-02-16 ENCOUNTER — Ambulatory Visit (HOSPITAL_COMMUNITY): Payer: Medicaid Other | Admitting: Anesthesiology

## 2023-02-16 ENCOUNTER — Ambulatory Visit (HOSPITAL_COMMUNITY)
Admission: RE | Admit: 2023-02-16 | Discharge: 2023-02-16 | Disposition: A | Discharge status: Home / Self Care | Payer: Medicaid Other | Attending: Internal Medicine | Admitting: Internal Medicine

## 2023-02-16 ENCOUNTER — Encounter (HOSPITAL_COMMUNITY): Payer: Self-pay

## 2023-02-16 ENCOUNTER — Other Ambulatory Visit: Payer: Self-pay

## 2023-02-16 ENCOUNTER — Encounter (HOSPITAL_COMMUNITY)
Admission: RE | Disposition: A | Discharge status: Home / Self Care | Payer: Self-pay | Source: Home / Self Care | Attending: Internal Medicine

## 2023-02-16 DIAGNOSIS — K648 Other hemorrhoids: Secondary | ICD-10-CM

## 2023-02-16 DIAGNOSIS — K625 Hemorrhage of anus and rectum: Secondary | ICD-10-CM | POA: Insufficient documentation

## 2023-02-16 DIAGNOSIS — I10 Essential (primary) hypertension: Secondary | ICD-10-CM | POA: Diagnosis not present

## 2023-02-16 DIAGNOSIS — K644 Residual hemorrhoidal skin tags: Secondary | ICD-10-CM | POA: Diagnosis not present

## 2023-02-16 DIAGNOSIS — R194 Change in bowel habit: Secondary | ICD-10-CM

## 2023-02-16 DIAGNOSIS — F419 Anxiety disorder, unspecified: Secondary | ICD-10-CM | POA: Diagnosis not present

## 2023-02-16 HISTORY — PX: COLONOSCOPY WITH PROPOFOL: SHX5780

## 2023-02-16 LAB — POCT PREGNANCY, URINE: Preg Test, Ur: NEGATIVE

## 2023-02-16 SURGERY — COLONOSCOPY WITH PROPOFOL
Anesthesia: General

## 2023-02-16 MED ORDER — LACTATED RINGERS IV SOLN: INTRAVENOUS | Status: DC

## 2023-02-16 MED ORDER — PHENYLEPHRINE 80 MCG/ML (10ML) SYRINGE FOR IV PUSH (FOR BLOOD PRESSURE SUPPORT)
PREFILLED_SYRINGE | INTRAVENOUS | Status: DC | PRN
  Administered 2023-02-16: 160 ug via INTRAVENOUS

## 2023-02-16 MED ORDER — GLYCOPYRROLATE PF 0.2 MG/ML IJ SOSY
PREFILLED_SYRINGE | INTRAMUSCULAR | Status: DC | PRN
  Administered 2023-02-16: .2 mg via INTRAVENOUS

## 2023-02-16 MED ORDER — PROPOFOL 500 MG/50ML IV EMUL
INTRAVENOUS | Status: DC | PRN
Start: 2023-02-16 — End: 2023-02-16
  Administered 2023-02-16: 150 ug/kg/min via INTRAVENOUS

## 2023-02-16 MED ORDER — GLYCOPYRROLATE PF 0.2 MG/ML IJ SOSY
PREFILLED_SYRINGE | INTRAMUSCULAR | Status: AC
  Filled 2023-02-16: qty 1

## 2023-02-16 MED ORDER — LIDOCAINE HCL (CARDIAC) PF 100 MG/5ML IV SOSY
PREFILLED_SYRINGE | INTRAVENOUS | Status: DC | PRN
  Administered 2023-02-16: 60 mg via INTRAVENOUS

## 2023-02-16 MED ORDER — PROPOFOL 10 MG/ML IV BOLUS
INTRAVENOUS | Status: DC | PRN
  Administered 2023-02-16: 100 mg via INTRAVENOUS

## 2023-02-16 NOTE — H&P (Signed)
Primary Care Physician:  Ernestina Penna, MD Primary Gastroenterologist:  Dr. Marletta Lor  Pre-Procedure History & Physical: HPI:  Zoe Hunter is a 27 y.o. female is here for a colonoscopy for rectal bleeding  Past Medical History:  Diagnosis Date   ADHD    ADHD (attention deficit hyperactivity disorder)    Anxiety    Bipolar disorder (HCC)    Cholelithiasis 01/2015   Depression    GERD (gastroesophageal reflux disease)    Pregnancy induced hypertension    Suicidal ideation 11/14/2018   Transient loss of consciousness 08/17/2020    Past Surgical History:  Procedure Laterality Date   CHOLECYSTECTOMY N/A 02/16/2015   Procedure: LAPAROSCOPIC CHOLECYSTECTOMY;  Surgeon: Natale Lay, MD;  Location: [facility] ORS;  Service: General;  Laterality: N/A;   ESOPHAGOGASTRODUODENOSCOPY (EGD) WITH PROPOFOL N/A 05/05/2016   Procedure: ESOPHAGOGASTRODUODENOSCOPY (EGD) WITH PROPOFOL;  Surgeon: Christena Deem, MD;  Location: [facility] ENDOSCOPY;  Service: Endoscopy;  Laterality: N/A;   TONSILLECTOMY     WISDOM TOOTH EXTRACTION      Prior to Admission medications   Medication Sig Start Date End Date Taking? Authorizing Provider  acetaminophen (TYLENOL) 500 MG tablet Take 2 tablets (1,000 mg total) by mouth every 6 (six) hours as needed (for pain scale < 4  OR  temperature  >/=  100.5 F). 09/27/22   Chari Manning, CNM    Allergies as of 01/11/2023   (No Known Allergies)    Family History  Problem Relation Age of Onset   Hypertension Mother    Cancer Mother    Hypertension Maternal Grandmother    Cancer Other    Hypertension Maternal Grandfather    Heart disease Maternal Grandfather     Social History   Socioeconomic History   Marital status: Significant Other    Spouse name: [name]   Number of children: Not on file   Years of education: Not on file   Highest education level: Not on file  Occupational History   Not on file  Tobacco Use   Smoking status: Never   Smokeless tobacco: Never    Tobacco comments:    boyfriend smokes  Vaping Use   Vaping status: Every Day  Substance and Sexual Activity   Alcohol use: No   Drug use: Not Currently    Types: Marijuana    Comment: quit - 30 days ago from 7/30   Sexual activity: Yes    Comment: undecided  Other Topics Concern   Not on file  Social History Narrative   Not on file   Social Drivers of Health   Financial Resource Strain: Low Risk  (12/05/2022)   Received from [facility]   Overall Financial Resource Strain (CARDIA)    Difficulty of Paying Living Expenses: Not hard at all  Food Insecurity: No Food Insecurity (12/05/2022)   Received from [facility]   Hunger Vital Sign    Worried About Running Out of Food in the Last Year: Never true    Ran Out of Food in the Last Year: Never true  Transportation Needs: No Transportation Needs (12/05/2022)   Received from [facility]   PRAPARE - Transportation    Lack of Transportation (Medical): No    Lack of Transportation (Non-Medical): No  Physical Activity: Inactive (10/09/2022)   Received from [facility]   Exercise Vital Sign    Days of Exercise per Week: 0 days    Minutes of Exercise per Session: 0 min  Stress: No Stress Concern  Present (10/09/2022)   Received from [facility] of Occupational Health - Occupational Stress Questionnaire    Feeling of Stress : Not at all  Recent Concern: Stress - Stress Concern Present (10/03/2022)   [employer] of Occupational Health - Occupational Stress Questionnaire    Feeling of Stress : To some extent  Social Connections: Moderately Isolated (10/09/2022)   Received from [facility]   Social Connection and Isolation Panel [NHANES]    Frequency of Communication with Friends and Family: More than three times a week    Frequency of Social Gatherings with Friends and Family: Once a week    Attends Religious Services: Never    Database administrator or Organizations: No    Attends Tax inspector Meetings: Never    Marital Status: Living with partner  Intimate Partner Violence: Not At Risk (01/29/2023)   Received from [facility]   Humiliation, Afraid, Rape, and Kick questionnaire    Fear of Current or Ex-Partner: No    Emotionally Abused: No    Physically Abused: No    Sexually Abused: No    Review of Systems: See HPI, otherwise negative ROS  Physical Exam: Vital signs in last 24 hours:     General:   Alert,  Well-developed, well-nourished, pleasant and cooperative in NAD Head:  Normocephalic and atraumatic. Eyes:  Sclera clear, no icterus.   Conjunctiva pink. Ears:  Normal auditory acuity. Nose:  No deformity, discharge,  or lesions. Msk:  Symmetrical without gross deformities. Normal posture. Extremities:  Without clubbing or edema. Neurologic:  Alert and  oriented x4;  grossly normal neurologically. Skin:  Intact without significant lesions or rashes. Psych:  Alert and cooperative. Normal mood and affect.  Impression/Plan: Zoe Hunter is here for a colonoscopy to be performed for rectal bleeding  The risks of the procedure including infection, bleed, or perforation as well as benefits, limitations, alternatives and imponderables have been reviewed with the patient. Questions have been answered. All parties agreeable.

## 2023-02-16 NOTE — Op Note (Addendum)
[facility] Patient Name: Zoe Hunter Procedure Date: 02/16/2023 12:55 PM MRN: [id number] Date of Birth: 1995-04-14 Attending MD: Hennie Duos. Marletta Lor , [place], [id number] CSN: [id number] Age: 27 Admit Type: Outpatient Procedure:                Colonoscopy Indications:              Rectal bleeding Providers:                Hennie Duos. Marletta Lor, DO, Buel Ream. Museum/gallery exhibitions officer, Charity fundraiser,                            Judeth Cornfield. Jessee Avers, Technician, Roseanne Kaufman, Pensions consultant Referring MD:              Medicines:                See the Anesthesia note for documentation of the                            administered medications Complications:            No immediate complications. Estimated Blood Loss:     Estimated blood loss: none. Procedure:                Pre-Anesthesia Assessment:                           - The anesthesia plan was to use monitored                            anesthesia care (MAC).                           After obtaining informed consent, the colonoscope                            was passed under direct vision. Throughout the                            procedure, the patient's blood pressure, pulse, and                            oxygen saturations were monitored continuously. The                            [id number]-HQ190L ([id number]) scope was introduced through                            the anus and advanced to the the terminal ileum,                            with identification of the appendiceal orifice and                            IC valve. The colonoscopy was performed without  difficulty. The patient tolerated the procedure                            well. The quality of the bowel preparation was                            evaluated using the BBPS [facility] Bowel Preparation                            Scale) with scores of: Right Colon = 3, Transverse                            Colon = 3 and Left Colon = 3 (entire  mucosa seen                            well with no residual staining, small fragments of                            stool or opaque liquid). The total BBPS score                            equals 9. Scope In: 1:11:38 PM Scope Out: 1:21:35 PM Scope Withdrawal Time: 0 hours 6 minutes 33 seconds  Total Procedure Duration: 0 hours 9 minutes 57 seconds  Findings:      Hemorrhoids were found on perianal exam.      Non-bleeding internal hemorrhoids were found.      The terminal ileum appeared normal.      The entire examined colon appeared normal. Impression:               - Hemorrhoids found on perianal exam.                           - Non-bleeding internal hemorrhoids.                           - The examined portion of the ileum was normal.                           - The entire examined colon is normal.                           - No specimens collected. Moderate Sedation:      Per Anesthesia Care Recommendation:           - Patient has a contact number available for                            emergencies. The signs and symptoms of potential                            delayed complications were discussed with the                            patient. Return to normal activities tomorrow.  Written discharge instructions were provided to the                            patient.                           - Resume previous diet.                           - Continue present medications.                           - Return to GI clinic in 8 months.                           - Consider hemorrhoid banding                           - Repeat colonoscopy at age 89 or sooner if higher                            risk for screening purposes. Procedure Code(s):        --- Professional ---                           [phone], Colonoscopy, flexible; diagnostic, including                            collection of specimen(s) by brushing or washing,                            when  performed (separate procedure) Diagnosis Code(s):        --- Professional ---                           K64.8, Other hemorrhoids                           K62.5, Hemorrhage of anus and rectum CPT copyright 2022 American Medical Association. All rights reserved. The codes documented in this report are preliminary and upon coder review may  be revised to meet current compliance requirements. Hennie Duos. Marletta Lor, DO Hennie Duos. Marletta Lor, DO 02/16/2023 1:30:55 PM This report has been signed electronically. Number of Addenda: 0

## 2023-02-16 NOTE — Discharge Instructions (Addendum)
  Colonoscopy Discharge Instructions  Read the instructions outlined below and refer to this sheet in the next few weeks. These discharge instructions provide you with general information on caring for yourself after you leave the hospital. Your doctor may also give you specific instructions. While your treatment has been planned according to the most current medical practices available, unavoidable complications occasionally occur.   ACTIVITY You may resume your regular activity, but move at a slower pace for the next 24 hours.  Take frequent rest periods for the next 24 hours.  Walking will help get rid of the air and reduce the bloated feeling in your belly (abdomen).  No driving for 24 hours (because of the medicine (anesthesia) used during the test).   Do not sign any important legal documents or operate any machinery for 24 hours (because of the anesthesia used during the test).  NUTRITION Drink plenty of fluids.  You may resume your normal diet as instructed by your doctor.  Begin with a light meal and progress to your normal diet. Heavy or fried foods are harder to digest and may make you feel sick to your stomach (nauseated).  Avoid alcoholic beverages for 24 hours or as instructed.  MEDICATIONS You may resume your normal medications unless your doctor tells you otherwise.  WHAT YOU CAN EXPECT TODAY Some feelings of bloating in the abdomen.  Passage of more gas than usual.  Spotting of blood in your stool or on the toilet paper.  IF YOU HAD POLYPS REMOVED DURING THE COLONOSCOPY: No aspirin products for 7 days or as instructed.  No alcohol for 7 days or as instructed.  Eat a soft diet for the next 24 hours.  FINDING OUT THE RESULTS OF YOUR TEST Not all test results are available during your visit. If your test results are not back during the visit, make an appointment with your caregiver to find out the results. Do not assume everything is normal if you have not heard from your  caregiver or the medical facility. It is important for you to follow up on all of your test results.  SEEK IMMEDIATE MEDICAL ATTENTION IF: You have more than a spotting of blood in your stool.  Your belly is swollen (abdominal distention).  You are nauseated or vomiting.  You have a temperature over 101.  You have abdominal pain or discomfort that is severe or gets worse throughout the day.   Your colonoscopy was relatively unremarkable.  I did not find any polyps or evidence of colon cancer.  I recommend repeating colonoscopy at age 35 for colon cancer screening purposes.  You do have external and internal hemorrhoids. I would recommend increasing fiber in your diet or adding OTC Benefiber/Metamucil. Be sure to drink at least 4 to 6 glasses of water daily. Follow-up with GI in 6-8 weeks. . Office will notify you.   I hope you have a great rest of your week!  Hennie Duos. Marletta Lor, D.O. Gastroenterology and Hepatology [facility]

## 2023-02-16 NOTE — Transfer of Care (Addendum)
Immediate Anesthesia Transfer of Care Note  Patient: Zoe Hunter  Procedure(s) Performed: COLONOSCOPY WITH PROPOFOL  Patient Location: Short Stay  Anesthesia Type:General  Level of Consciousness: drowsy and patient cooperative  Airway & Oxygen Therapy: Patient Spontanous Breathing and Patient connected to nasal cannula oxygen  Post-op Assessment: Report given to RN and Post -op Vital signs reviewed and stable  Post vital signs: Reviewed and stable  Last Vitals:  Vitals Value Taken Time  BP 102/46 02/16/23   1327  Temp 36.8 02/16/23   1327  Pulse 96 02/16/23   1327  Resp 24 02/16/23   1327  SpO2 100% 02/16/23   1327    Last Pain:  Vitals:   02/16/23 1257  TempSrc:   PainSc: 0-No pain      Patients Stated Pain Goal: 5 (02/16/23 1246)  Complications: No notable events documented.

## 2023-02-16 NOTE — Anesthesia Preprocedure Evaluation (Signed)
Anesthesia Evaluation  Patient identified by MRN, date of birth, ID band Patient awake    Reviewed: Allergy & Precautions, H&P , NPO status , Patient's Chart, lab work & pertinent test results, reviewed documented beta blocker date and time   Airway Mallampati: II  TM Distance: >3 FB Neck ROM: full    Dental no notable dental hx.    Pulmonary neg pulmonary ROS   Pulmonary exam normal breath sounds clear to auscultation       Cardiovascular Exercise Tolerance: Good hypertension, negative cardio ROS  Rhythm:regular Rate:Normal     Neuro/Psych  PSYCHIATRIC DISORDERS Anxiety Depression Bipolar Disorder   negative neurological ROS  negative psych ROS   GI/Hepatic negative GI ROS, Neg liver ROS,GERD  ,,  Endo/Other  negative endocrine ROS    Renal/GU negative Renal ROS  negative genitourinary   Musculoskeletal   Abdominal   Peds  Hematology negative hematology ROS (+) Blood dyscrasia, anemia   Anesthesia Other Findings   Reproductive/Obstetrics negative OB ROS                             Anesthesia Physical Anesthesia Plan  ASA: 2  Anesthesia Plan: General   Post-op Pain Management:    Induction:   PONV Risk Score and Plan: Propofol infusion  Airway Management Planned:   Additional Equipment:   Intra-op Plan:   Post-operative Plan:   Informed Consent: I have reviewed the patients History and Physical, chart, labs and discussed the procedure including the risks, benefits and alternatives for the proposed anesthesia with the patient or authorized representative who has indicated his/her understanding and acceptance.     Dental Advisory Given  Plan Discussed with: CRNA  Anesthesia Plan Comments:        Anesthesia Quick Evaluation

## 2023-02-20 NOTE — Anesthesia Postprocedure Evaluation (Signed)
Anesthesia Post Note  Patient: Zoe Hunter  Procedure(s) Performed: COLONOSCOPY WITH PROPOFOL  Patient location during evaluation: Phase II Anesthesia Type: General Level of consciousness: awake Pain management: pain level controlled Vital Signs Assessment: post-procedure vital signs reviewed and stable Respiratory status: spontaneous breathing and respiratory function stable Cardiovascular status: blood pressure returned to baseline and stable Postop Assessment: no headache and no apparent nausea or vomiting Anesthetic complications: no Comments: Late entry   No notable events documented.   Last Vitals:  Vitals:   02/16/23 1246 02/16/23 1327  BP: 103/64 (!) 102/46  Pulse: 67 96  Resp: 19 (!) 24  Temp: 36.8 C 36.8 C  SpO2: 97% 100%    Last Pain:  Vitals:   02/19/23 1244  TempSrc:   PainSc: 0-No pain                 Windell Norfolk

## 2023-02-28 DIAGNOSIS — Z419 Encounter for procedure for purposes other than remedying health state, unspecified: Secondary | ICD-10-CM | POA: Diagnosis not present

## 2023-03-05 DIAGNOSIS — F411 Generalized anxiety disorder: Secondary | ICD-10-CM | POA: Diagnosis not present

## 2023-03-05 DIAGNOSIS — F331 Major depressive disorder, recurrent, moderate: Secondary | ICD-10-CM | POA: Diagnosis not present

## 2023-03-08 ENCOUNTER — Encounter (HOSPITAL_COMMUNITY): Payer: Self-pay | Admitting: Internal Medicine

## 2023-03-15 DIAGNOSIS — F902 Attention-deficit hyperactivity disorder, combined type: Secondary | ICD-10-CM | POA: Diagnosis not present

## 2023-03-15 DIAGNOSIS — F411 Generalized anxiety disorder: Secondary | ICD-10-CM | POA: Diagnosis not present

## 2023-03-26 DIAGNOSIS — F411 Generalized anxiety disorder: Secondary | ICD-10-CM | POA: Diagnosis not present

## 2023-03-31 DIAGNOSIS — Z419 Encounter for procedure for purposes other than remedying health state, unspecified: Secondary | ICD-10-CM | POA: Diagnosis not present

## 2023-04-05 ENCOUNTER — Encounter: Payer: Self-pay | Admitting: Gastroenterology

## 2023-04-05 ENCOUNTER — Ambulatory Visit: Payer: Medicaid Other | Admitting: Gastroenterology

## 2023-04-05 VITALS — BP 126/80 | HR 71 | Temp 98.3°F | Ht 63.5 in | Wt 258.2 lb

## 2023-04-05 DIAGNOSIS — K648 Other hemorrhoids: Secondary | ICD-10-CM

## 2023-04-05 DIAGNOSIS — R194 Change in bowel habit: Secondary | ICD-10-CM

## 2023-04-05 DIAGNOSIS — K5900 Constipation, unspecified: Secondary | ICD-10-CM

## 2023-04-05 DIAGNOSIS — R1030 Lower abdominal pain, unspecified: Secondary | ICD-10-CM | POA: Diagnosis not present

## 2023-04-05 DIAGNOSIS — K625 Hemorrhage of anus and rectum: Secondary | ICD-10-CM

## 2023-04-05 NOTE — Patient Instructions (Signed)
 I recommend taking Benefiber 2 teaspoons each morning in beverage of your choice!  I have given you samples of Linzess at the lowest dosage. There are 3 doses available, but let's start there. Linzess works best when taken once a day every day, on an empty stomach, at least 30 minutes before your first meal of the day.  When Linzess is taken daily as directed:  *Constipation relief is typically felt in about a week *IBS-C patients may begin to experience relief from belly pain and overall abdominal symptoms (pain, discomfort, and bloating) in about 1 week,   with symptoms typically improving over 12 weeks.  Diarrhea may occur in the first 2 weeks -keep taking it.  The diarrhea should go away and you should start having normal, complete, full bowel movements. It may be helpful to start treatment when you can be near the comfort of your own bathroom, such as a weekend.   Let me know how it works for you!  I will see you in 6 weeks for banding!  I enjoyed seeing you again today! I value our relationship and want to provide genuine, compassionate, and quality care. You may receive a survey regarding your visit with me, and I welcome your feedback! Thanks so much for taking the time to complete this. I look forward to seeing you again.      Therisa MICAEL Stager, PhD, ANP-BC [facility]

## 2023-04-05 NOTE — Progress Notes (Signed)
 Gastroenterology Office Note     Primary Care Physician:  Olegario Messier, MD  Primary Gastroenterologist: Dr. Cindie    Chief Complaint   Chief Complaint  Patient presents with   Follow-up    Still having rectal pain after bm's     History of Present Illness   Zoe Hunter is a 28 y.o. female presenting today with a history of chronic marijuana use and N/V, documented delayed gastric emptying, rectal bleeding s/p colonoscopy in interim from last visit with non-bleeding internal hemorrhoids.   Every now and then will have lower abdominal pain after BM. Happens once or twice every few months. BM about every other day. Sometimes solid, more often looser. No rectal bleeding. Sometimes not productive. Would like to have hemorrhoid banding in the future.   Baby boy Zoe Hunter is almost 4 months. Zoe Hunter is 28 years old. She would like to have more children in the future.   Colonoscopy Dec 2024: non-bleeding internal hemorrhoids, normal-appearing TI. Colon normal. No specimens collected.    Past Medical History:  Diagnosis Date   ADHD    ADHD (attention deficit hyperactivity disorder)    Anxiety    Bipolar disorder (HCC)    Cholelithiasis 01/2015   Depression    GERD (gastroesophageal reflux disease)    Pregnancy induced hypertension    Suicidal ideation 11/14/2018   Transient loss of consciousness 08/17/2020    Past Surgical History:  Procedure Laterality Date   CHOLECYSTECTOMY N/A 02/16/2015   Procedure: LAPAROSCOPIC CHOLECYSTECTOMY;  Surgeon: Oneil Chang, MD;  Location: [facility] ORS;  Service: General;  Laterality: N/A;   COLONOSCOPY WITH PROPOFOL  N/A 02/16/2023   Procedure: COLONOSCOPY WITH PROPOFOL ;  Surgeon: Cindie Carlin MARLA, DO;  Location: [facility];  Service: Endoscopy;  Laterality: N/A;  2:00 pm, asa 2   ESOPHAGOGASTRODUODENOSCOPY (EGD) WITH PROPOFOL  N/A 05/05/2016   Procedure: ESOPHAGOGASTRODUODENOSCOPY (EGD) WITH PROPOFOL ;  Surgeon: Gladis RAYMOND Mariner, MD;   Location: [facility] ENDOSCOPY;  Service: Endoscopy;  Laterality: N/A;   TONSILLECTOMY     WISDOM TOOTH EXTRACTION      Current Outpatient Medications  Medication Sig Dispense Refill   acetaminophen  (TYLENOL ) 500 MG tablet Take 2 tablets (1,000 mg total) by mouth every 6 (six) hours as needed (for pain scale < 4  OR  temperature  >/=  100.5 F).     No current facility-administered medications for this visit.    Allergies as of 04/05/2023   (No Known Allergies)    Family History  Problem Relation Age of Onset   Hypertension Mother    Cancer Mother    Hypertension Maternal Grandmother    Cancer Other    Hypertension Maternal Grandfather    Heart disease Maternal Grandfather     Social History   Socioeconomic History   Marital status: Significant Other    Spouse name: Norman   Number of children: Not on file   Years of education: Not on file   Highest education level: Not on file  Occupational History   Not on file  Tobacco Use   Smoking status: Never   Smokeless tobacco: Never   Tobacco comments:    boyfriend smokes  Vaping Use   Vaping status: Some Days  Substance and Sexual Activity   Alcohol use: No   Drug use: Not Currently    Types: Marijuana    Comment: quit - 30 days ago from 7/30   Sexual activity: Yes    Comment: undecided  Other Topics Concern  Not on file  Social History Narrative   Not on file   Social Drivers of Health   Financial Resource Strain: Low Risk  (12/05/2022)   Received from [facility]   Overall Financial Resource Strain (CARDIA)    Difficulty of Paying Living Expenses: Not hard at all  Food Insecurity: No Food Insecurity (12/05/2022)   Received from [facility]   Hunger Vital Sign    Worried About Running Out of Food in the Last Year: Never true    Ran Out of Food in the Last Year: Never true  Transportation Needs: No Transportation Needs (12/05/2022)   Received from [facility] - Transportation    Lack of  Transportation (Medical): No    Lack of Transportation (Non-Medical): No  Physical Activity: Inactive (10/09/2022)   Received from [facility]   Exercise Vital Sign    Days of Exercise per Week: 0 days    Minutes of Exercise per Session: 0 min  Stress: No Stress Concern Present (10/09/2022)   Received from [facility] Occupational [facility] - Occupational Stress Questionnaire    Feeling of Stress : Not at all  Recent Concern: Stress - Stress Concern Present (10/03/2022)   [employer] of Occupational Health - Occupational Stress Questionnaire    Feeling of Stress : To some extent  Social Connections: Moderately Isolated (10/09/2022)   Received from [facility]   Social Connection and Isolation Panel [NHANES]    Frequency of Communication with Friends and Family: More than three times a week    Frequency of Social Gatherings with Friends and Family: Once a week    Attends Religious Services: Never    Database Administrator or Organizations: No    Attends Banker Meetings: Never    Marital Status: Living with partner  Intimate Partner Violence: Not At Risk (01/29/2023)   Received from [facility]   Humiliation, Afraid, Rape, and Kick questionnaire    Fear of Current or Ex-Partner: No    Emotionally Abused: No    Physically Abused: No    Sexually Abused: No     Review of Systems   Gen: Denies any fever, chills, fatigue, weight loss, lack of appetite.  CV: Denies chest pain, heart palpitations, peripheral edema, syncope.  Resp: Denies shortness of breath at rest or with exertion. Denies wheezing or cough.  GI: Denies dysphagia or odynophagia. Denies jaundice, hematemesis, fecal incontinence. GU : Denies urinary burning, urinary frequency, urinary hesitancy MS: Denies joint pain, muscle weakness, cramps, or limitation of movement.  Derm: Denies rash, itching, dry skin Psych: Denies depression, anxiety, memory loss, and  confusion Heme: Denies bruising, bleeding, and enlarged lymph nodes.   Physical Exam   BP 126/80 (BP Location: Right Arm, Patient Position: Sitting, Cuff Size: Large)   Pulse 71   Temp 98.3 F (36.8 C) (Oral)   Ht 5' 3.5 (1.613 m)   Wt 258 lb 3.2 oz (117.1 kg)   LMP  (LMP Unknown)   SpO2 97%   Breastfeeding Yes   BMI 45.02 kg/m  General:   Alert and oriented. Pleasant and cooperative. Well-nourished and well-developed.  Head:  Normocephalic and atraumatic. Eyes:  Without icterus Abdomen:  +BS, soft, non-tender and non-distended. No HSM noted. No guarding or rebound. No masses appreciated.  Rectal:  Deferred  Msk:  Symmetrical without gross deformities. Normal posture. Extremities:  Without edema. Neurologic:  Alert and  oriented x4;  grossly normal neurologically. Skin:  Intact without significant lesions or rashes. Psych:  Alert and cooperative. Normal mood and affect.   Assessment   Zoe Hunter is a 28 y.o. female presenting today with a history of intermittent lower abdominal pain and rectal bleeding for routine follow-up.  Suspect dealing with underlying constipation and will trial Linzess 72 mcg. Benefiber recommended as well. Colonoscopy on file with internal hemorrhoids. She is an excellent candidate for banding and would like to pursue this in future.   PLAN    Trial of Linzess 72 mcg daily, samples provided Benefiber daily Call if persistent abdominal pain despite bowel regimen and will order CT Hemorrhoid banding in 6 weeks    Therisa MICAEL Stager, PhD, ANP-BC [facility]

## 2023-04-09 DIAGNOSIS — F411 Generalized anxiety disorder: Secondary | ICD-10-CM | POA: Diagnosis not present

## 2023-04-23 DIAGNOSIS — Z862 Personal history of diseases of the blood and blood-forming organs and certain disorders involving the immune mechanism: Secondary | ICD-10-CM | POA: Diagnosis not present

## 2023-04-23 DIAGNOSIS — F411 Generalized anxiety disorder: Secondary | ICD-10-CM | POA: Diagnosis not present

## 2023-04-23 DIAGNOSIS — F900 Attention-deficit hyperactivity disorder, predominantly inattentive type: Secondary | ICD-10-CM | POA: Diagnosis not present

## 2023-04-23 DIAGNOSIS — F331 Major depressive disorder, recurrent, moderate: Secondary | ICD-10-CM | POA: Diagnosis not present

## 2023-04-23 DIAGNOSIS — O99013 Anemia complicating pregnancy, third trimester: Secondary | ICD-10-CM | POA: Diagnosis not present

## 2023-04-24 DIAGNOSIS — F411 Generalized anxiety disorder: Secondary | ICD-10-CM | POA: Diagnosis not present

## 2023-04-27 DIAGNOSIS — D509 Iron deficiency anemia, unspecified: Secondary | ICD-10-CM | POA: Diagnosis not present

## 2023-04-28 DIAGNOSIS — Z419 Encounter for procedure for purposes other than remedying health state, unspecified: Secondary | ICD-10-CM | POA: Diagnosis not present

## 2023-04-30 DIAGNOSIS — F3161 Bipolar disorder, current episode mixed, mild: Secondary | ICD-10-CM | POA: Diagnosis not present

## 2023-04-30 DIAGNOSIS — Z131 Encounter for screening for diabetes mellitus: Secondary | ICD-10-CM | POA: Diagnosis not present

## 2023-04-30 DIAGNOSIS — G44209 Tension-type headache, unspecified, not intractable: Secondary | ICD-10-CM | POA: Diagnosis not present

## 2023-04-30 DIAGNOSIS — Z13220 Encounter for screening for lipoid disorders: Secondary | ICD-10-CM | POA: Diagnosis not present

## 2023-04-30 DIAGNOSIS — K219 Gastro-esophageal reflux disease without esophagitis: Secondary | ICD-10-CM | POA: Diagnosis not present

## 2023-04-30 DIAGNOSIS — R6889 Other general symptoms and signs: Secondary | ICD-10-CM | POA: Diagnosis not present

## 2023-04-30 DIAGNOSIS — R5383 Other fatigue: Secondary | ICD-10-CM | POA: Diagnosis not present

## 2023-04-30 DIAGNOSIS — K5900 Constipation, unspecified: Secondary | ICD-10-CM | POA: Diagnosis not present

## 2023-05-07 DIAGNOSIS — F411 Generalized anxiety disorder: Secondary | ICD-10-CM | POA: Diagnosis not present

## 2023-05-15 ENCOUNTER — Ambulatory Visit (INDEPENDENT_AMBULATORY_CARE_PROVIDER_SITE_OTHER): Payer: Medicaid Other | Admitting: Gastroenterology

## 2023-05-15 ENCOUNTER — Encounter: Payer: Self-pay | Admitting: Gastroenterology

## 2023-05-15 VITALS — BP 113/71 | HR 85 | Temp 97.6°F | Ht 63.0 in | Wt 276.8 lb

## 2023-05-15 DIAGNOSIS — K602 Anal fissure, unspecified: Secondary | ICD-10-CM | POA: Insufficient documentation

## 2023-05-15 DIAGNOSIS — K641 Second degree hemorrhoids: Secondary | ICD-10-CM

## 2023-05-15 NOTE — Patient Instructions (Signed)
 I am calling in a special compounded cream to [facility]. You will use this 2-3 times a day per rectum for the next 4-6 weeks.  Continue Linzess as you are doing. Avoid sitting for more than 2 minutes at a time on the toilet. Avoid straining.  We can try to band at your next appt!  I enjoyed seeing you again today! I value our relationship and want to provide genuine, compassionate, and quality care. You may receive a survey regarding your visit with me, and I welcome your feedback! Thanks so much for taking the time to complete this. I look forward to seeing you again.      Gelene Mink, PhD, ANP-BC [facility]

## 2023-05-15 NOTE — Progress Notes (Signed)
       [facility] BANDING PROCEDURE NOTE  Zoe Hunter is a 28 y.o. female presenting today for consideration of hemorrhoid banding. Last colonoscopy Dec 2024: non-bleeding internal hemorrhoids, normal-appearing TI. Colon normal. No specimens collected. She has started on Linzess 72 mcg with improvement in constipation.    The patient presents with symptomatic grade 2 hemorrhoids, unresponsive to maximal medical therapy, requesting rubber band ligation of her hemorrhoidal disease. All risks, benefits, and alternative forms of therapy were described and informed consent was obtained.   The decision was made to band the left lateral internal hemorrhoid, but she had significant discomfort with any engaging of the band. 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 The decision was made to abort banding. I have sent in compounded cream to [facility] (called and spoke with pharmacist. Hydrocortisone cream, lidocaine, and diltiazem ointment, apply 3 times a day for next 4-6 weeks, disp #30 g with 1 refills.  The patient was discharged home without pain or other issues. Dietary and behavioral recommendations were given, along with follow-up instructions. The patient will return in several weeks for followup and possible additional banding as required.    Gelene Mink, PhD, ANP-BC [facility]

## 2023-05-21 DIAGNOSIS — F331 Major depressive disorder, recurrent, moderate: Secondary | ICD-10-CM | POA: Diagnosis not present

## 2023-05-21 DIAGNOSIS — F411 Generalized anxiety disorder: Secondary | ICD-10-CM | POA: Diagnosis not present

## 2023-06-06 DIAGNOSIS — F900 Attention-deficit hyperactivity disorder, predominantly inattentive type: Secondary | ICD-10-CM | POA: Diagnosis not present

## 2023-06-09 DIAGNOSIS — Z419 Encounter for procedure for purposes other than remedying health state, unspecified: Secondary | ICD-10-CM | POA: Diagnosis not present

## 2023-06-18 DIAGNOSIS — F411 Generalized anxiety disorder: Secondary | ICD-10-CM | POA: Diagnosis not present

## 2023-06-26 ENCOUNTER — Encounter: Payer: Self-pay | Admitting: Gastroenterology

## 2023-06-26 ENCOUNTER — Ambulatory Visit (INDEPENDENT_AMBULATORY_CARE_PROVIDER_SITE_OTHER): Admitting: Gastroenterology

## 2023-06-26 VITALS — BP 101/65 | HR 71 | Temp 98.6°F | Ht 63.0 in | Wt 278.6 lb

## 2023-06-26 DIAGNOSIS — K641 Second degree hemorrhoids: Secondary | ICD-10-CM | POA: Diagnosis not present

## 2023-06-26 DIAGNOSIS — K648 Other hemorrhoids: Secondary | ICD-10-CM | POA: Insufficient documentation

## 2023-06-26 NOTE — Patient Instructions (Signed)

## 2023-06-26 NOTE — Progress Notes (Signed)
    [facility] BANDING PROCEDURE NOTE  Zoe Hunter is a 28 y.o. female presenting today for consideration of hemorrhoid banding. Last colonoscopy Dec 2024: non-bleeding internal hemorrhoids, normal-appearing TI. Colon normal. No specimens collected. She has started on Linzess 72 mcg with improvement in constipation but no longer needs this. At last visit in March, she had significant discomfort and felt to have anal fissure. I called in compounded cream and returns today to discuss banding. Mild bleeding the other day. Feels more improved with rectal discomfort. Pain much improved. She would like to pursue banding as still has rectal itching and occasional bleeding.    The patient presents with symptomatic grade 2 hemorrhoids, unresponsive to maximal medical therapy, requesting rubber band ligation of her hemorrhoidal disease. All risks, benefits, and alternative forms of therapy were described and informed consent was obtained.   The decision was made to band the left lateral internal hemorrhoid, and the [facility] O'Regan System was used to perform band ligation without complication. Digital anorectal examination was then performed to assure proper positioning of the band, and to adjust the banded tissue as required. The patient was discharged home without pain or other issues. Dietary and behavioral recommendations were given, along with follow-up instructions. The patient will return in several weeks for followup and possible additional banding as required.  No complications were encountered and the patient tolerated the procedure well.   Delman Ferns, PhD, ANP-BC [facility]enter[facility]

## 2023-07-09 DIAGNOSIS — Z419 Encounter for procedure for purposes other than remedying health state, unspecified: Secondary | ICD-10-CM | POA: Diagnosis not present

## 2023-07-09 DIAGNOSIS — F411 Generalized anxiety disorder: Secondary | ICD-10-CM | POA: Diagnosis not present

## 2023-07-12 ENCOUNTER — Telehealth: Payer: Self-pay

## 2023-07-12 ENCOUNTER — Encounter: Payer: Self-pay | Admitting: Gastroenterology

## 2023-07-12 ENCOUNTER — Ambulatory Visit (INDEPENDENT_AMBULATORY_CARE_PROVIDER_SITE_OTHER): Admitting: Gastroenterology

## 2023-07-12 VITALS — BP 103/70 | HR 66 | Temp 98.4°F | Ht 63.0 in | Wt 274.8 lb

## 2023-07-12 DIAGNOSIS — K641 Second degree hemorrhoids: Secondary | ICD-10-CM

## 2023-07-12 DIAGNOSIS — K648 Other hemorrhoids: Secondary | ICD-10-CM

## 2023-07-12 NOTE — Progress Notes (Signed)
    [facility] BANDING PROCEDURE NOTE  Zoe Hunter is a 28 y.o. female presenting today for consideration of hemorrhoid banding. Last colonoscopy  Dec 2024: non-bleeding internal hemorrhoids, normal-appearing TI. Colon normal. No specimens collected. Possible fissure in March 2025 s/p treatment with compounded cream. First banding completed 4/29 of left lateral. She had lower abdominal cramping with this that resolved after 48 hours.l    The patient presents with symptomatic grade 2 hemorrhoids, unresponsive to maximal medical therapy, requesting rubber band ligation of her hemorrhoidal disease. All risks, benefits, and alternative forms of therapy were described and informed consent was obtained.   The decision was made to band neutrally, and the [facility] O'Regan System was used to perform band ligation without complication. Digital anorectal examination was then performed to assure proper positioning of the band, and to adjust the banded tissue as required. She has noted significant pressure with banding but no pain. I suspect we will not be able to complete a third banding at this time. The patient was discharged home without pain or other issues. Dietary and behavioral recommendations were given, along with follow-up instructions. The patient will return in 4-6 weeks. Recommend supportive measures going forward as she has had difficulty with tolerating banding due to pressure (no pain).   No complications were encountered and the patient tolerated the procedure well.   Delman Ferns, PhD, ANP-BC [facility]

## 2023-07-12 NOTE — Telephone Encounter (Signed)
 Feels like uterine cramps. Feels sweaty. Nausea with the pain. Started when she got home.  Discussed we can do stat CT. I highly doubt any complication such as perforation (postioning was neutral), pelvic sepsis (as banding was just done less than a few hours ago), and I query if non-related process although mild cramping is not uncommon post banding. However, patient was in tears and crying. We discussed presenting to the ED as would be quickest for stat CT.  I called back to also offer patient an in person exam with manipulation of band in case muscle had been captured. She sounded relaxed and reported decreased abdominal pain from a 10/10 to a 7. No crying. States nausea subsiding. Declining this. Will hold off on imaging.   She is requesting a work note for her husband for today and tomorrow.

## 2023-07-12 NOTE — Telephone Encounter (Signed)
 Contacted patient again and still with vomiting intermittently. Pain feels the same. Pain is in lower abdomen, not rectal pain. Recommended ED evaluation. Patient is unsure what she wants to do. I recommended ED evaluation due to her symptoms and not normally what one would expect post-banding. Mild cramping is not uncommon, but she endorses significant pain. Again, recommend ED evaluation.

## 2023-07-12 NOTE — Telephone Encounter (Signed)
 Returned the pt's call and was advised that she is vomiting a lot, a lot of pain to a 10 and screaming and crying. Please call this pt

## 2023-07-12 NOTE — Patient Instructions (Signed)
 I will see you in 4-6 weeks.   You can use the cream as needed or get over the counter lidocaine  to use internally. You can use Ibuprofen  as needed.  I hope you have a good time at [facility]!  I enjoyed seeing you again today! I value our relationship and want to provide genuine, compassionate, and quality care. You may receive a survey regarding your visit with me, and I welcome your feedback! Thanks so much for taking the time to complete this. I look forward to seeing you again.      Delman Ferns, PhD, ANP-BC [facility]

## 2023-07-17 DIAGNOSIS — F411 Generalized anxiety disorder: Secondary | ICD-10-CM | POA: Diagnosis not present

## 2023-07-20 DIAGNOSIS — D509 Iron deficiency anemia, unspecified: Secondary | ICD-10-CM | POA: Diagnosis not present

## 2023-07-30 DIAGNOSIS — F411 Generalized anxiety disorder: Secondary | ICD-10-CM | POA: Diagnosis not present

## 2023-08-02 DIAGNOSIS — F411 Generalized anxiety disorder: Secondary | ICD-10-CM | POA: Diagnosis not present

## 2023-08-06 DIAGNOSIS — F411 Generalized anxiety disorder: Secondary | ICD-10-CM | POA: Diagnosis not present

## 2023-08-06 DIAGNOSIS — D509 Iron deficiency anemia, unspecified: Secondary | ICD-10-CM | POA: Diagnosis not present

## 2023-08-08 IMAGING — US US BREAST*L* LIMITED INC AXILLA
1 series · 2 of 2 positions shown · non-contrast
Comparison: None.

CLINICAL DATA: Painful area in the LEFT inner breast.

EXAM:
ULTRASOUND OF THE LEFT BREAST

[Series 1: us breast*left* limited inc axilla · 0.06mm/px · 2 of 2 slices shown]
[im 1/2]
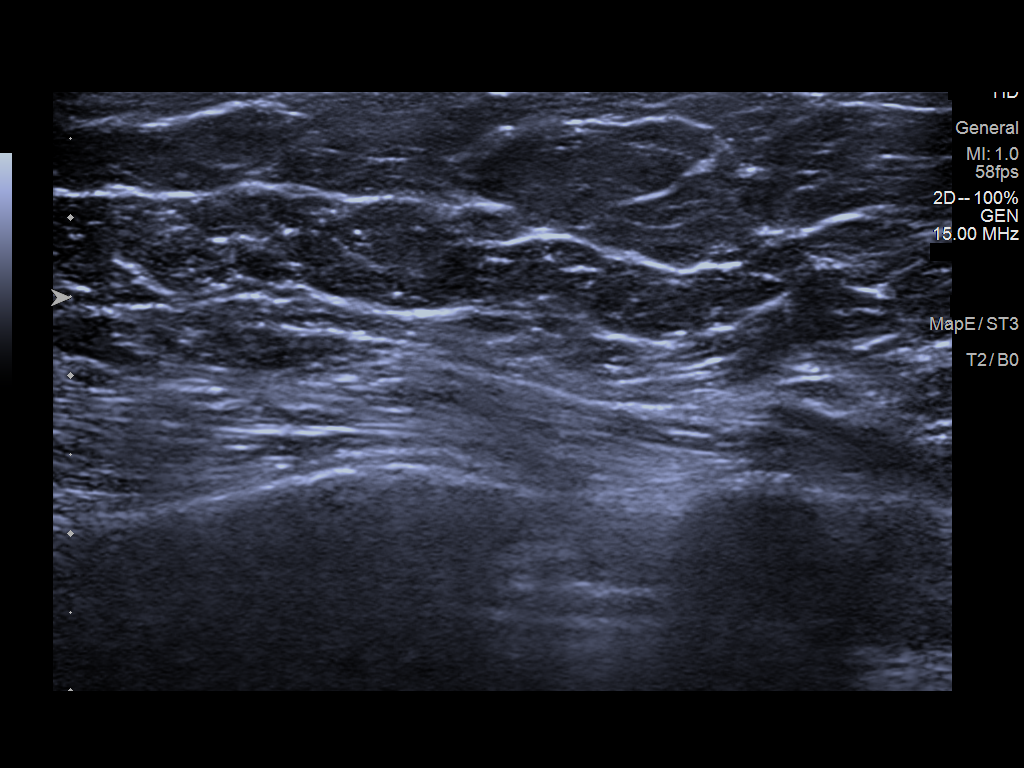
[im 2/2]
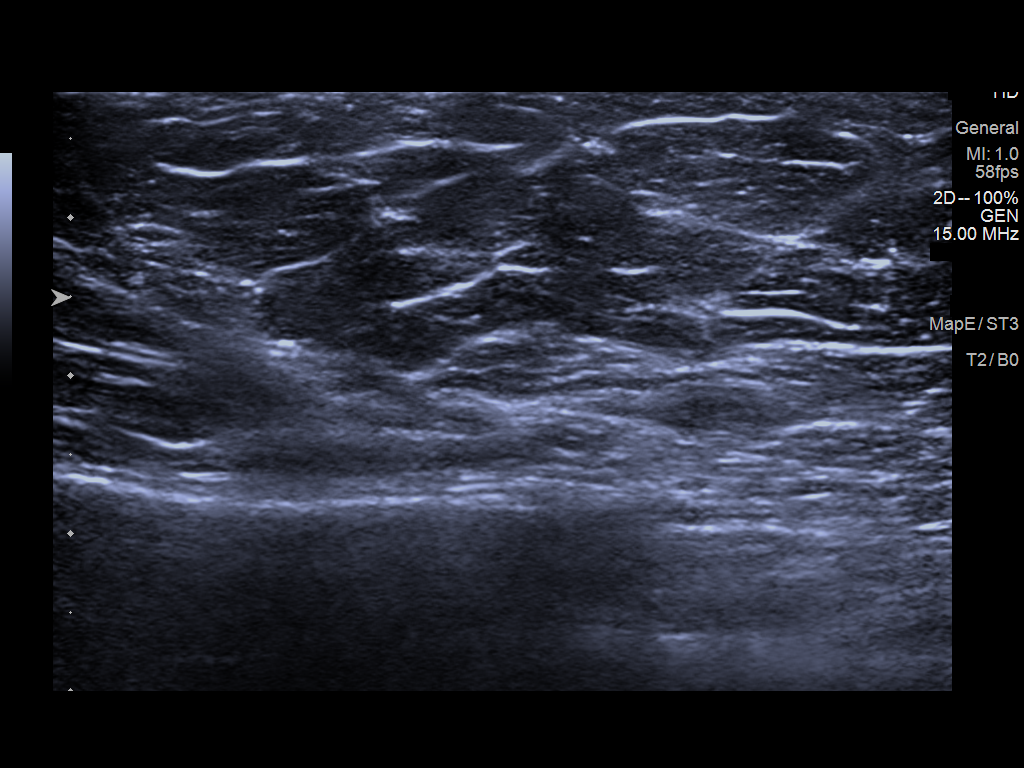

[2 of 2 positions shown; findings below may reference images not displayed]

FINDINGS: On physical exam, no suspicious mass is appreciated.

Targeted ultrasound performed of site of painful concern in the
inner breast. No suspicious cystic or solid mass is seen.
IMPRESSION: No sonographic evidence of malignancy at the site of painful concern
in LEFT inner breast. Any further workup of the patient's symptoms
should be based on the clinical assessment. Recommend routine annual
screening mammogram the age of 40 unless there are intervening
clinical concerns.

RECOMMENDATION:
Screening mammogram at age 40 unless there are persistent or
intervening clinical concerns. (Code:GR-[id number])

I have discussed the findings and recommendations with the patient.
If applicable, a reminder letter will be sent to the patient
regarding the next appointment.

BI-RADS CATEGORY  1: Negative.

## 2023-08-09 ENCOUNTER — Ambulatory Visit: Admitting: Gastroenterology

## 2023-08-09 DIAGNOSIS — Z419 Encounter for procedure for purposes other than remedying health state, unspecified: Secondary | ICD-10-CM | POA: Diagnosis not present

## 2023-08-15 DIAGNOSIS — F411 Generalized anxiety disorder: Secondary | ICD-10-CM | POA: Diagnosis not present

## 2023-08-15 DIAGNOSIS — F331 Major depressive disorder, recurrent, moderate: Secondary | ICD-10-CM | POA: Diagnosis not present

## 2023-08-20 DIAGNOSIS — N926 Irregular menstruation, unspecified: Secondary | ICD-10-CM | POA: Diagnosis not present

## 2023-08-20 DIAGNOSIS — Z3490 Encounter for supervision of normal pregnancy, unspecified, unspecified trimester: Secondary | ICD-10-CM | POA: Diagnosis not present

## 2023-08-27 DIAGNOSIS — N9489 Other specified conditions associated with female genital organs and menstrual cycle: Secondary | ICD-10-CM | POA: Diagnosis not present

## 2023-08-27 DIAGNOSIS — N888 Other specified noninflammatory disorders of cervix uteri: Secondary | ICD-10-CM | POA: Diagnosis not present

## 2023-09-08 DIAGNOSIS — Z419 Encounter for procedure for purposes other than remedying health state, unspecified: Secondary | ICD-10-CM | POA: Diagnosis not present

## 2023-09-11 DIAGNOSIS — Z8249 Family history of ischemic heart disease and other diseases of the circulatory system: Secondary | ICD-10-CM | POA: Diagnosis not present

## 2023-09-11 DIAGNOSIS — F909 Attention-deficit hyperactivity disorder, unspecified type: Secondary | ICD-10-CM | POA: Diagnosis not present

## 2023-09-11 DIAGNOSIS — Z833 Family history of diabetes mellitus: Secondary | ICD-10-CM | POA: Diagnosis not present

## 2023-09-11 DIAGNOSIS — F319 Bipolar disorder, unspecified: Secondary | ICD-10-CM | POA: Diagnosis not present

## 2023-09-11 DIAGNOSIS — N912 Amenorrhea, unspecified: Secondary | ICD-10-CM | POA: Diagnosis not present

## 2023-09-11 DIAGNOSIS — K5900 Constipation, unspecified: Secondary | ICD-10-CM | POA: Diagnosis not present

## 2023-09-11 DIAGNOSIS — I10 Essential (primary) hypertension: Secondary | ICD-10-CM | POA: Diagnosis not present

## 2023-09-11 DIAGNOSIS — K219 Gastro-esophageal reflux disease without esophagitis: Secondary | ICD-10-CM | POA: Diagnosis not present

## 2023-09-11 DIAGNOSIS — J45909 Unspecified asthma, uncomplicated: Secondary | ICD-10-CM | POA: Diagnosis not present

## 2023-09-19 ENCOUNTER — Other Ambulatory Visit: Payer: Self-pay | Admitting: *Deleted

## 2023-09-19 ENCOUNTER — Encounter: Payer: Self-pay | Admitting: Gastroenterology

## 2023-09-19 ENCOUNTER — Ambulatory Visit (INDEPENDENT_AMBULATORY_CARE_PROVIDER_SITE_OTHER): Admitting: Gastroenterology

## 2023-09-19 VITALS — BP 102/66 | HR 69 | Temp 98.4°F | Ht 63.0 in | Wt 262.1 lb

## 2023-09-19 DIAGNOSIS — K602 Anal fissure, unspecified: Secondary | ICD-10-CM

## 2023-09-19 DIAGNOSIS — K648 Other hemorrhoids: Secondary | ICD-10-CM

## 2023-09-19 DIAGNOSIS — K649 Unspecified hemorrhoids: Secondary | ICD-10-CM

## 2023-09-19 DIAGNOSIS — K529 Noninfective gastroenteritis and colitis, unspecified: Secondary | ICD-10-CM

## 2023-09-19 MED ORDER — DICYCLOMINE HCL 10 MG PO CAPS: 10.0000 mg | ORAL_CAPSULE | Freq: Three times a day (TID) | ORAL | 2 refills | Status: AC

## 2023-09-19 NOTE — Progress Notes (Signed)
 Gastroenterology Office Note     Primary Care Physician:  Olegario Messier, MD  Primary Gastroenterologist: Dr. Cindie   Chief Complaint   Chief Complaint  Patient presents with   Hemorrhoids    Patient here today to discuss her hemorrhoids. She says she did not do so well on the last banding.She says she was vomiting and in pain for two days after the banding.      History of Present Illness   Zoe Hunter is a 28 y.o. 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   After last banding, she noted significant pressure and pain. We attempted to manipulate the band in clinic, and she noted some improvement. However, once she got home, she called in with significant vomiting and rectal pain. She presents back today to discuss symptoms.   Notes intermittent lower abdominal cramping and looser stool. This is chronic and worsened post cholecystectomy. Will have rectal discomfort if multiple BMs. No knife-like pain currently. Still with rectal bleeding, itching, pressure. She is interested in surgical referral for hemorrhoids. Currently not taking anything for presumed IBS. She has previously taken dicyclomine  a few years ago. Recent labs from May 2025 with Hgb 12.3, ferritin 70. TSH normal in March 2025 and June 2025. No prior celiac labs. EGD without duodenal biopsy in March 2018.   Colonoscopy   Dec 2024: non-bleeding internal hemorrhoids, normal-appearing TI. Colon normal. No specimens collected.   EGD March 2018 at [place]: normal esophagus, gastritis, normal examined duodenum. Path: negative H.pylori, mild chronic gastritis noted.    Past Medical History:   Diagnosis Date   ADHD    ADHD (attention deficit hyperactivity disorder)    Anxiety    Bipolar disorder (HCC)    Cholelithiasis 01/2015   Depression    GERD (gastroesophageal reflux disease)    Pregnancy induced hypertension    Suicidal ideation 11/14/2018   Transient loss of consciousness 08/17/2020    Past Surgical History:  Procedure Laterality Date   CHOLECYSTECTOMY N/A 02/16/2015   Procedure: LAPAROSCOPIC CHOLECYSTECTOMY;  Surgeon: Oneil Chang, MD;  Location: [facility] ORS;  Service: General;  Laterality: N/A;   COLONOSCOPY WITH PROPOFOL  N/A 02/16/2023   Procedure: COLONOSCOPY WITH PROPOFOL ;  Surgeon: Cindie Carlin MARLA, DO;  Location: [facility];  Service: Endoscopy;  Laterality: N/A;  2:00 pm, asa 2   ESOPHAGOGASTRODUODENOSCOPY (EGD) WITH PROPOFOL  N/A 05/05/2016   Procedure: ESOPHAGOGASTRODUODENOSCOPY (EGD) WITH PROPOFOL ;  Surgeon: Gladis RAYMOND Mariner, MD;  Location: [facility] ENDOSCOPY;  Service: Endoscopy;  Laterality: N/A;   TONSILLECTOMY     WISDOM TOOTH EXTRACTION      Current Outpatient Medications  Medication Sig Dispense Refill   acetaminophen  (TYLENOL ) 500 MG tablet Take 2 tablets (1,000 mg total) by mouth every 6 (six) hours as needed (for pain scale < 4  OR  temperature  >/=  100.5 F).     amphetamine-dextroamphetamine (ADDERALL XR) 30 MG 24 hr capsule Take 30 mg by mouth daily.     dicyclomine  (BENTYL ) 10 MG capsule Take 1 capsule (10 mg total) by mouth 4 (four) times daily -  before meals and at bedtime. For abdominal cramping and loose stool 120 capsule 2   No current facility-administered medications for this  visit.    Allergies as of 09/19/2023   (No Known Allergies)    Family History  Problem Relation Age of Onset   Hypertension Mother    Cancer Mother    Hypertension Maternal Grandmother    Cancer Other    Hypertension Maternal Grandfather    Heart disease Maternal Grandfather     Social History   Socioeconomic History   Marital status: Significant  Other    Spouse name: Norman   Number of children: Not on file   Years of education: Not on file   Highest education level: Not on file  Occupational History   Not on file  Tobacco Use   Smoking status: Never   Smokeless tobacco: Never   Tobacco comments:    boyfriend smokes  Vaping Use   Vaping status: Some Days  Substance and Sexual Activity   Alcohol use: No   Drug use: Not Currently    Types: Marijuana    Comment: quit - 30 days ago from 7/30   Sexual activity: Yes    Comment: undecided  Other Topics Concern   Not on file  Social History Narrative   Not on file   Social Drivers of Health   Financial Resource Strain: Low Risk  (08/20/2023)   Received from [facility]   Overall Financial Resource Strain (CARDIA)    How hard is it for you to pay for the very basics like food, housing, medical care, and heating?: Not very hard  Food Insecurity: No Food Insecurity (08/20/2023)   Received from [facility]   Hunger Vital Sign    Within the past 12 months, you worried that your food would run out before you got the money to buy more.: Never true    Within the past 12 months, the food you bought just didn't last and you didn't have money to get more.: Never true  Transportation Needs: No Transportation Needs (08/20/2023)   Received from [facility]   PRAPARE - Transportation    Lack of Transportation (Medical): No    Lack of Transportation (Non-Medical): No  Physical Activity: Inactive (08/20/2023)   Received from [facility]   Exercise Vital Sign    On average, how many days per week do you engage in moderate to strenuous exercise (like a brisk walk)?: 0 days    On average, how many minutes do you engage in exercise at this level?: 0 min  Stress: Stress Concern Present (08/20/2023)   Received from [facility] - Occupational Stress Questionnaire    Do you feel stress - tense, restless, nervous, or anxious, or unable  to sleep at night because your mind is troubled all the time - these days?: Very much  Social Connections: Socially Isolated (08/20/2023)   Received from [facility]   Social Connection and Isolation Panel    In a typical week, how many times do you talk on the phone with family, friends, or neighbors?: More than three times a week    How often do you get together with friends or relatives?: More than three times a week    How often do you attend church or religious services?: Never    Do you belong to any clubs or organizations such as church groups, unions, fraternal or athletic groups, or school groups?: No    How often do you attend meetings of the clubs or organizations you belong to?: Never  Are you married, widowed, divorced, separated, never married, or living with a partner?: Never married  Intimate Partner Violence: Not At Risk (08/20/2023)   Received from [facility]   Humiliation, Afraid, Rape, and Kick questionnaire    Within the last year, have you been afraid of your partner or ex-partner?: No    Within the last year, have you been humiliated or emotionally abused in other ways by your partner or ex-partner?: No    Within the last year, have you been kicked, hit, slapped, or otherwise physically hurt by your partner or ex-partner?: No    Within the last year, have you been raped or forced to have any kind of sexual activity by your partner or ex-partner?: No     Review of Systems   Gen: Denies any fever, chills, fatigue, weight loss, lack of appetite.  CV: Denies chest pain, heart palpitations, peripheral edema, syncope.  Resp: Denies shortness of breath at rest or with exertion. Denies wheezing or cough.  GI: Denies dysphagia or odynophagia. Denies jaundice, hematemesis, fecal incontinence. GU : Denies urinary burning, urinary frequency, urinary hesitancy MS: Denies joint pain, muscle weakness, cramps, or limitation of movement.  Derm: Denies rash, itching, dry  skin Psych: Denies depression, anxiety, memory loss, and confusion Heme: Denies bruising, bleeding, and enlarged lymph nodes.   Physical Exam   BP 102/66 (BP Location: Left Arm, Patient Position: Sitting, Cuff Size: Normal)   Pulse 69   Temp 98.4 F (36.9 C) (Temporal)   Ht 5' 3 (1.6 m)   Wt 262 lb 1.6 oz (118.9 kg)   BMI 46.43 kg/m  General:   Alert and oriented. Pleasant and cooperative. Well-nourished and well-developed.  Head:  Normocephalic and atraumatic. Eyes:  Without icterus Rectal:  DRE without mass. Notes significant discomfort with DRE and unable to complete fully. Difficult to visualize any anal fissure.  Msk:  Symmetrical without gross deformities. Normal posture. Extremities:  Without edema. Neurologic:  Alert and  oriented x4;  grossly normal neurologically. Skin:  Intact without significant lesions or rashes. Psych:  Alert and cooperative. Normal mood and affect.   Assessment   AARICA WAX is a 28 y.o. 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  Hemorrhoids: difficult banding previously due to discomfort even after treatment for anal fissure. Prior left lateral and neutral banding earlier this year, but she had significant pressure following May 2025 banding. 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 DRE difficult today. Would benefit from surgical referral.  Suspected IBS: with likely bile salt diarrhea component as well. Resume dicyclomine .  Check inflammatory serologies and celiac panel to be thorough. MR abdomen in March 2024 unrevealing. Colonoscopy on file from Dec 2024 without signs of IBD and normal-appearing TI documented.     PLAN   Maximized hemorrhoid banding. 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

## 2023-09-19 NOTE — Patient Instructions (Signed)
 We are referring you to [facility] due to hemorrhoids and possible anal fissure.  Definitely start back on the compounded cream by [facility].  I have also sent in dicyclomine  to take up to 4 times a day before meals for abdominal cramping and looser stool. This can cause constipation, dry mouth, dizziness, so start with just once to twice a day.  We will see you in 3 months!  I enjoyed seeing you again today! I value our relationship and want to provide genuine, compassionate, and quality care. You may receive a survey regarding your visit with me, and I welcome your feedback! Thanks so much for taking the time to complete this. I look forward to seeing you again.      Therisa MICAEL Stager, PhD, ANP-BC [facility] Gastroenterology

## 2023-09-24 ENCOUNTER — Ambulatory Visit: Payer: Self-pay | Admitting: Gastroenterology

## 2023-09-24 LAB — CELIAC DISEASE PANEL
IgA/Immunoglobulin A, Serum: 223 mg/dL (ref 87–352)
Transglutaminase IgA: <2 U/mL (ref 0–3)

## 2023-09-24 LAB — SEDIMENTATION RATE: Sed Rate: 22 mm/h (ref 0–32)

## 2023-09-24 LAB — C-REACTIVE PROTEIN: CRP: 5 mg/L (ref 0–10)

## 2023-09-27 NOTE — Telephone Encounter (Signed)
 noted

## 2023-10-09 DIAGNOSIS — Z419 Encounter for procedure for purposes other than remedying health state, unspecified: Secondary | ICD-10-CM | POA: Diagnosis not present

## 2023-10-10 DIAGNOSIS — K641 Second degree hemorrhoids: Secondary | ICD-10-CM | POA: Diagnosis not present

## 2023-10-10 DIAGNOSIS — Z8719 Personal history of other diseases of the digestive system: Secondary | ICD-10-CM | POA: Diagnosis not present

## 2023-10-10 DIAGNOSIS — K594 Anal spasm: Secondary | ICD-10-CM | POA: Diagnosis not present

## 2023-10-10 DIAGNOSIS — K644 Residual hemorrhoidal skin tags: Secondary | ICD-10-CM | POA: Diagnosis not present

## 2023-10-10 DIAGNOSIS — K625 Hemorrhage of anus and rectum: Secondary | ICD-10-CM | POA: Diagnosis not present

## 2023-10-18 ENCOUNTER — Other Ambulatory Visit: Payer: Self-pay | Admitting: Surgery

## 2023-10-18 DIAGNOSIS — K641 Second degree hemorrhoids: Secondary | ICD-10-CM

## 2023-10-18 DIAGNOSIS — Z8719 Personal history of other diseases of the digestive system: Secondary | ICD-10-CM

## 2023-10-18 DIAGNOSIS — K625 Hemorrhage of anus and rectum: Secondary | ICD-10-CM

## 2023-10-18 DIAGNOSIS — K594 Anal spasm: Secondary | ICD-10-CM

## 2023-10-23 DIAGNOSIS — F331 Major depressive disorder, recurrent, moderate: Secondary | ICD-10-CM | POA: Diagnosis not present

## 2023-10-23 DIAGNOSIS — F411 Generalized anxiety disorder: Secondary | ICD-10-CM | POA: Diagnosis not present

## 2023-10-26 ENCOUNTER — Other Ambulatory Visit

## 2023-11-06 DIAGNOSIS — M79671 Pain in right foot: Secondary | ICD-10-CM | POA: Diagnosis not present

## 2023-11-06 DIAGNOSIS — E782 Mixed hyperlipidemia: Secondary | ICD-10-CM | POA: Diagnosis not present

## 2023-11-06 DIAGNOSIS — E669 Obesity, unspecified: Secondary | ICD-10-CM | POA: Diagnosis not present

## 2023-11-08 DIAGNOSIS — F411 Generalized anxiety disorder: Secondary | ICD-10-CM | POA: Diagnosis not present

## 2023-11-08 DIAGNOSIS — F331 Major depressive disorder, recurrent, moderate: Secondary | ICD-10-CM | POA: Diagnosis not present

## 2023-11-09 DIAGNOSIS — Z419 Encounter for procedure for purposes other than remedying health state, unspecified: Secondary | ICD-10-CM | POA: Diagnosis not present

## 2023-11-15 DIAGNOSIS — W5501XA Bitten by cat, initial encounter: Secondary | ICD-10-CM | POA: Diagnosis not present

## 2023-11-15 DIAGNOSIS — S61551A Open bite of right wrist, initial encounter: Secondary | ICD-10-CM | POA: Diagnosis not present

## 2023-11-15 DIAGNOSIS — S61451A Open bite of right hand, initial encounter: Secondary | ICD-10-CM | POA: Diagnosis not present

## 2023-11-15 DIAGNOSIS — S51851A Open bite of right forearm, initial encounter: Secondary | ICD-10-CM | POA: Diagnosis not present

## 2023-11-15 DIAGNOSIS — Z23 Encounter for immunization: Secondary | ICD-10-CM | POA: Diagnosis not present

## 2023-11-22 DIAGNOSIS — F411 Generalized anxiety disorder: Secondary | ICD-10-CM | POA: Diagnosis not present

## 2023-11-22 DIAGNOSIS — F331 Major depressive disorder, recurrent, moderate: Secondary | ICD-10-CM | POA: Diagnosis not present

## 2023-11-30 DIAGNOSIS — E669 Obesity, unspecified: Secondary | ICD-10-CM | POA: Diagnosis not present

## 2023-11-30 DIAGNOSIS — Z6841 Body Mass Index (BMI) 40.0 and over, adult: Secondary | ICD-10-CM | POA: Diagnosis not present

## 2023-12-04 DIAGNOSIS — Z23 Encounter for immunization: Secondary | ICD-10-CM | POA: Diagnosis not present

## 2023-12-04 DIAGNOSIS — E66813 Obesity, class 3: Secondary | ICD-10-CM | POA: Diagnosis not present

## 2023-12-04 DIAGNOSIS — Z6841 Body Mass Index (BMI) 40.0 and over, adult: Secondary | ICD-10-CM | POA: Diagnosis not present

## 2023-12-04 DIAGNOSIS — D509 Iron deficiency anemia, unspecified: Secondary | ICD-10-CM | POA: Diagnosis not present

## 2023-12-04 DIAGNOSIS — F411 Generalized anxiety disorder: Secondary | ICD-10-CM | POA: Diagnosis not present

## 2023-12-06 DIAGNOSIS — F331 Major depressive disorder, recurrent, moderate: Secondary | ICD-10-CM | POA: Diagnosis not present

## 2023-12-06 DIAGNOSIS — F411 Generalized anxiety disorder: Secondary | ICD-10-CM | POA: Diagnosis not present

## 2023-12-14 DIAGNOSIS — D509 Iron deficiency anemia, unspecified: Secondary | ICD-10-CM | POA: Diagnosis not present

## 2023-12-18 DIAGNOSIS — F902 Attention-deficit hyperactivity disorder, combined type: Secondary | ICD-10-CM | POA: Diagnosis not present

## 2023-12-18 DIAGNOSIS — F331 Major depressive disorder, recurrent, moderate: Secondary | ICD-10-CM | POA: Diagnosis not present

## 2023-12-18 DIAGNOSIS — F411 Generalized anxiety disorder: Secondary | ICD-10-CM | POA: Diagnosis not present

## 2023-12-18 DIAGNOSIS — F900 Attention-deficit hyperactivity disorder, predominantly inattentive type: Secondary | ICD-10-CM | POA: Diagnosis not present

## 2023-12-27 DIAGNOSIS — F331 Major depressive disorder, recurrent, moderate: Secondary | ICD-10-CM | POA: Diagnosis not present

## 2024-01-01 DIAGNOSIS — F411 Generalized anxiety disorder: Secondary | ICD-10-CM | POA: Diagnosis not present

## 2024-01-09 DIAGNOSIS — Z419 Encounter for procedure for purposes other than remedying health state, unspecified: Secondary | ICD-10-CM | POA: Diagnosis not present

## 2024-01-10 DIAGNOSIS — F331 Major depressive disorder, recurrent, moderate: Secondary | ICD-10-CM | POA: Diagnosis not present

## 2024-01-10 DIAGNOSIS — Z6841 Body Mass Index (BMI) 40.0 and over, adult: Secondary | ICD-10-CM | POA: Diagnosis not present

## 2024-01-10 DIAGNOSIS — F411 Generalized anxiety disorder: Secondary | ICD-10-CM | POA: Diagnosis not present

## 2024-01-10 DIAGNOSIS — Z713 Dietary counseling and surveillance: Secondary | ICD-10-CM | POA: Diagnosis not present

## 2024-01-10 DIAGNOSIS — E669 Obesity, unspecified: Secondary | ICD-10-CM | POA: Diagnosis not present

## 2024-01-10 NOTE — Telephone Encounter (Signed)
 Zoe Hunter, please have patient come see me first available, not urgent. She uses MyChart, so it's easy to reach her on there. Thanks!

## 2024-01-17 DIAGNOSIS — F411 Generalized anxiety disorder: Secondary | ICD-10-CM | POA: Diagnosis not present

## 2024-01-17 DIAGNOSIS — F331 Major depressive disorder, recurrent, moderate: Secondary | ICD-10-CM | POA: Diagnosis not present

## 2024-01-23 ENCOUNTER — Encounter (HOSPITAL_COMMUNITY): Payer: Self-pay | Admitting: General Surgery

## 2024-01-23 ENCOUNTER — Ambulatory Visit (HOSPITAL_COMMUNITY)
Admission: RE | Admit: 2024-01-23 | Discharge: 2024-01-23 | Disposition: A | Discharge status: Home / Self Care | Attending: General Surgery | Admitting: General Surgery

## 2024-01-23 ENCOUNTER — Encounter (HOSPITAL_COMMUNITY)
Admission: RE | Disposition: A | Discharge status: Home / Self Care | Payer: Self-pay | Source: Home / Self Care | Attending: General Surgery

## 2024-01-23 DIAGNOSIS — K625 Hemorrhage of anus and rectum: Secondary | ICD-10-CM | POA: Diagnosis not present

## 2024-01-23 DIAGNOSIS — K594 Anal spasm: Secondary | ICD-10-CM | POA: Insufficient documentation

## 2024-01-23 HISTORY — PX: ANAL RECTAL MANOMETRY: SHX6358

## 2024-01-23 SURGERY — MANOMETRY, ANORECTAL

## 2024-01-23 NOTE — Progress Notes (Signed)
Anal manometry done per protocol. Pt tolerated well without distress or complication   

## 2024-01-31 DIAGNOSIS — F411 Generalized anxiety disorder: Secondary | ICD-10-CM | POA: Diagnosis not present

## 2024-02-08 DIAGNOSIS — K6289 Other specified diseases of anus and rectum: Secondary | ICD-10-CM | POA: Diagnosis not present

## 2024-02-12 ENCOUNTER — Ambulatory Visit: Payer: Self-pay | Admitting: Surgery

## 2024-03-25 ENCOUNTER — Ambulatory Visit: Admitting: Gastroenterology

## 2024-04-10 ENCOUNTER — Ambulatory Visit: Admitting: Physical Therapy

## 2024-05-22 ENCOUNTER — Ambulatory Visit: Admitting: Gastroenterology
